# Patient Record
Sex: Female | Born: 1940
Health system: Southern US, Community
[De-identification: ages and names within clinical notes are randomized; demographics above are authoritative.]

## PROBLEM LIST (undated history)

## (undated) DIAGNOSIS — R259 Unspecified abnormal involuntary movements: Secondary | ICD-10-CM

## (undated) DIAGNOSIS — F329 Major depressive disorder, single episode, unspecified: Secondary | ICD-10-CM

## (undated) DIAGNOSIS — M25569 Pain in unspecified knee: Secondary | ICD-10-CM

## (undated) DIAGNOSIS — R29898 Other symptoms and signs involving the musculoskeletal system: Secondary | ICD-10-CM

## (undated) DIAGNOSIS — R030 Elevated blood-pressure reading, without diagnosis of hypertension: Secondary | ICD-10-CM

## (undated) DIAGNOSIS — M899 Disorder of bone, unspecified: Secondary | ICD-10-CM

## (undated) DIAGNOSIS — M949 Disorder of cartilage, unspecified: Secondary | ICD-10-CM

## (undated) DIAGNOSIS — M1990 Unspecified osteoarthritis, unspecified site: Secondary | ICD-10-CM

## (undated) DIAGNOSIS — E7800 Pure hypercholesterolemia, unspecified: Secondary | ICD-10-CM

## (undated) HISTORY — DX: Other symptoms and signs involving the musculoskeletal system: R29.898

## (undated) HISTORY — DX: Elevated blood-pressure reading, without diagnosis of hypertension: R03.0

## (undated) HISTORY — DX: Pain in unspecified knee: M25.569

## (undated) HISTORY — DX: Pure hypercholesterolemia, unspecified: E78.00

## (undated) HISTORY — DX: Disorder of cartilage, unspecified: M94.9

## (undated) HISTORY — DX: Unspecified abnormal involuntary movements: R25.9

## (undated) HISTORY — DX: Disorder of bone, unspecified: M89.9

## (undated) HISTORY — DX: Unspecified osteoarthritis, unspecified site: M19.90

## (undated) HISTORY — DX: Major depressive disorder, single episode, unspecified: F32.9

## (undated) HISTORY — PX: COLONOSCOPY: SHX174

---

## 1973-03-30 HISTORY — PX: VAGINAL HYSTERECTOMY: SUR661

## 1978-11-29 HISTORY — PX: BREAST SURGERY: SHX581

## 2012-12-09 ENCOUNTER — Ambulatory Visit (INDEPENDENT_AMBULATORY_CARE_PROVIDER_SITE_OTHER): Payer: Medicare Other | Admitting: Cardiovascular Disease

## 2012-12-09 ENCOUNTER — Encounter: Payer: Self-pay | Admitting: Cardiovascular Disease

## 2012-12-09 VITALS — BP 136/67 | HR 71 | Ht 67.0 in | Wt 171.0 lb

## 2012-12-09 DIAGNOSIS — R079 Chest pain, unspecified: Secondary | ICD-10-CM

## 2012-12-09 DIAGNOSIS — R0789 Other chest pain: Secondary | ICD-10-CM | POA: Insufficient documentation

## 2012-12-09 NOTE — Patient Instructions (Addendum)
Your physician has requested that you have a lexiscan myoview.  Please follow instruction sheet, as given.  Your physician recommends that you schedule a follow-up appointment in: as needed basis

## 2012-12-09 NOTE — Progress Notes (Signed)
     Mariah Milling Date of Birth  [birth year]       [facility] Office [address], [address]  [address], [address] [place], [place]  [address]   [place], [place]  [address] [phone]     [phone]   Fax  [phone]    Fax [phone]  Problem List: 1. Chest pain 2.   History of Present Illness:  Ms. Schmutz is a 72 yo who presents today with a 3-4 months hx of intermittant CP/ discomfort.  Only lasts a few seconds.  Occurs at any time - not associated with  , exertion, eating, drinking, deep breath.  The discomfort runs up to her throuat and into her jaw.   She thought it may be gas.   She has occasional palps that sound like PVCs.  No tachycardia.   She does some activity - [employer] - 3 times a week.    She is able to keep up with the other ladies without problems.  She does have some leg fatigue ( gives out) when she does the treadmill.  Her son has noticed that she has a shuffling gait.  She has some back pain.   She occasionally has a "hot feeling" that comes over her and she gets very weak.  Last several minutes.  This episode is not associated with any chest pain.  Occurs any time.  She has not felt well for the past several month- her medical doctor has checked thyroid, basic labs.    She had a normal stress test in July  No current outpatient prescriptions on file prior to visit.   No current facility-administered medications on file prior to visit.    No Known Allergies  No past medical history on file.  Past Surgical History  Procedure Laterality Date  . Abdominal hysterectomy    . Breast surgery      benign cyst    History  Smoking status  . Never Smoker   Smokeless tobacco  . Not on file    History  Alcohol Use No    Family History  Problem Relation Age of Onset  . CAD Mother   . CAD Father   . CAD Brother     Reviw of Systems:  Reviewed in the HPI.  All other systems are negative.  Physical  Exam: Blood pressure 136/67, pulse 71, height 5\' 7"  (1.702 m). General: Well developed, well nourished, in no acute distress.  Head: Normocephalic, atraumatic, sclera non-icteric, mucus membranes are moist,   Neck: Supple. Carotids are 2 + without bruits. No JVD   Lungs: Clear   Heart: RR, normal S1, split S2   Abdomen: Soft, non-tender, non-distended with normal bowel sounds.  Msk:  Strength and tone are normal   Extremities: No clubbing or cyanosis. No edema.  Distal pedal pulses are 2+ and equal    Neuro: CN II - XII intact.  Alert and oriented X 3.   Psych:  Normal   ECG: Sept. 12, 2014:  NSR at 71. Normal ECG.    Assessment / Plan:

## 2012-12-09 NOTE — Assessment & Plan Note (Addendum)
Tami Vega presents today for further evaluation of some rather atypical episodes of chest pain. She does have a strong family history of coronary artery disease including CAD in her mother, father, and brother. She has episodes of sudden weakness and fatigue. She's had a stress echocardiogram but she did not walk for very long and I actually think it  was a nondiagnostic test.  I would like to perform a lexiscan Myoview study for further evaluation. We will not schedule her for a return appointment but I'll be glad to see her if the Myoview study is abnormal or she has any further problems. We will have her return to see her medical doctor.

## 2012-12-29 ENCOUNTER — Encounter (HOSPITAL_COMMUNITY): Payer: Medicare Other

## 2013-01-27 ENCOUNTER — Institutional Professional Consult (permissible substitution): Payer: Self-pay | Admitting: Cardiovascular Disease

## 2013-02-07 ENCOUNTER — Telehealth: Payer: Self-pay | Admitting: *Deleted

## 2013-02-07 NOTE — Telephone Encounter (Signed)
Pt declined stress test. Was made aware test order was still available if she want to proceed. She verbalized understanding.

## 2013-02-07 NOTE — Telephone Encounter (Signed)
Message copied by Antony Odea on Tue Feb 07, 2013  5:51 PM ------      Message from: Mariane Masters D      Created: Fri Dec 09, 2012 10:09 AM      Regarding: MYOVIEW       12/09/12 Appointment on 12/29/12. ------

## 2013-10-13 ENCOUNTER — Ambulatory Visit: Payer: No Typology Code available for payment source | Admitting: Neurology

## 2013-10-19 ENCOUNTER — Encounter: Payer: Self-pay | Admitting: *Deleted

## 2013-10-20 ENCOUNTER — Telehealth: Payer: Self-pay | Admitting: Neurology

## 2013-10-20 ENCOUNTER — Ambulatory Visit (INDEPENDENT_AMBULATORY_CARE_PROVIDER_SITE_OTHER): Payer: Medicare HMO | Admitting: Neurology

## 2013-10-20 ENCOUNTER — Encounter: Payer: Self-pay | Admitting: Neurology

## 2013-10-20 VITALS — BP 164/86 | HR 68 | Temp 98.0°F | Ht 67.0 in | Wt 176.0 lb

## 2013-10-20 DIAGNOSIS — F418 Other specified anxiety disorders: Secondary | ICD-10-CM

## 2013-10-20 DIAGNOSIS — G20 Parkinson's disease: Secondary | ICD-10-CM

## 2013-10-20 DIAGNOSIS — F341 Dysthymic disorder: Secondary | ICD-10-CM

## 2013-10-20 MED ORDER — RASAGILINE MESYLATE 1 MG PO TABS: ORAL_TABLET | ORAL | Status: DC

## 2013-10-20 NOTE — Patient Instructions (Addendum)
You have signs and symptoms of Parkinson's disease.   Remember to drink plenty of fluid, eat healthy meals and do not skip any meals. Try to eat protein with a every meal and eat a healthy snack such as fruit or nuts in between meals. Try to keep a regular sleep-wake schedule and try to exercise daily, particularly in the form of walking, 20-30 minutes a day, if you can.   Taking your medication on schedule is key. I would like to start you on Azilect (generic name: rasagiline) 1 mg strength: take 1/2 pill each morning for 2 weeks, then 1 whole pill each morning thereafter and ongoing until further notice, or unless you develop an adverse reaction or side effects. Common side effects reported are headaches, nausea, diarrhea, or constipation. Rare side effects can be confusion, personality changes, hallucinations.  Try to stay active physically and mentally. Engage in social activities in your community and with your family and try to keep up with current events by reading the newspaper or watching the news. Try to do word puzzles and you may like to do word puzzles and brain games on the computer such as on [email or web address].   As far as diagnostic testing, I will order: Brain MRI.   I would like to see you back in 3 months, sooner if we need to. Please call us with any interim questions, concerns, problems, updates or refill requests.  Please also call us for any test results so we can go over those with you on the phone. Our nursing staff will answer any of your questions and relay your messages to me and also relay most of my messages to you.  Our phone number is [phone]. We also have an after hours call service for urgent matters and there is a physician on-call for urgent questions, that cannot wait till the next work day. For any emergencies you know to call 911 or go to the nearest emergency room.

## 2013-10-20 NOTE — Telephone Encounter (Signed)
I called back to provide number for the assistance program with this drug, [phone].  She will contact them and see if she qualifies for aide.  If she does not meet the criteria, she will call us back, otherwise she will proceed with enrolling in the program.

## 2013-10-20 NOTE — Telephone Encounter (Signed)
Please advise 

## 2013-10-20 NOTE — Telephone Encounter (Signed)
Patoent states that AZILECT is too expensive for her and is questioning if there is another medication less expensive that she can be prescribed. Please call to advise.

## 2013-10-20 NOTE — Progress Notes (Signed)
Subjective:    Patient ID: Tami Vega is a 73 y.o. female.  HPI    Tami Foley, MD, PhD [facility] [address], [address] [address] [place], [place] [address]  Dear Dr. Belva Crome,   I saw your patient, Tami Vega, upon your kind request in my neurologic clinic today for initial consultation of her tremor. The patient is accompanied by her oldest son, Tami Vega, today. As you know, Tami Vega is a 73 year old right-handed woman with an underlying medical history of hyperlipidemia, history of pituitary microadenoma, precancerous breast lesions status post double mastectomy in the 1980s, who has noticed a left upper extremity tremor some 6 months to 12 months ago.  She has noted L sided issues with fine motor skills, resting tremor on the LUE, she feels easily fatigued and it is difficult for her to pick up her L leg sometimes. Thankfully, she has not fallen, but her balance is off at times. She goes to [employer] 4 times a week, but has less stamina. She is a life long non-smoker, does not drink alcohol and has no history of head injury, exposure to pesticides, but did work in a [employer], therapeutic for 33 years and [place]. She has no FHx of PD or tremors.   Her Past Medical History Is Significant For: Past Medical History  Diagnosis Date  . Other musculoskeletal symptoms referable to limbs(729.89)   . Pain in joint, lower leg   . Disorder of bone and cartilage, unspecified   . Pure hypercholesterolemia   . Abnormal involuntary movements(781.0)   . Major depressive disorder, single episode, unspecified   . Elevated blood pressure reading without diagnosis of hypertension     Her Past Surgical History Is Significant For: Past Surgical History  Procedure Laterality Date  . Vaginal hysterectomy  1975  . Breast surgery  1980's    benign cyst  . Colonoscopy  1995, 1998    Her Family History Is Significant For: Family History  Problem Relation Age of  Onset  . CAD Mother   . CAD Father   . CAD Brother   . Breast cancer Mother   . Breast cancer Maternal Aunt   . Colon cancer Maternal Aunt   . Hyperlipidemia Sister   . Hypertension Sister     Her Social History Is Significant For: History   Social History  . Marital Status: Married    Spouse Name: N/A    Number of Children: N/A  . Years of Education: N/A   Social History Main Topics  . Smoking status: Never Smoker   . Smokeless tobacco: None  . Alcohol Use: No  . Drug Use: No  . Sexual Activity: None   Other Topics Concern  . None   Social History Narrative  . None    Her Allergies Are:  No Known Allergies:   Her Current Medications Are:  Outpatient Encounter Prescriptions as of 10/20/2013  Medication Sig  . Calcium Carbonate-Vit D-Min (CALCIUM 1200 PO) Take by mouth daily. Take one tablet a day  . Cholecalciferol (VITAMIN D-3 PO) Take by mouth daily. Take one capsule a day  . cyanocobalamin 1000 MCG tablet Take 100 mcg by mouth daily.  Marland Kitchen escitalopram (LEXAPRO) 5 MG tablet Take 1 tablet by mouth daily.  . fish oil-omega-3 fatty acids 1000 MG capsule Take 2 g by mouth daily.  . Multiple Vitamin (MULTIVITAMIN) capsule Take 1 capsule by mouth daily.  :   Review of Systems:  Out of a complete 14 point review of systems, all are reviewed and negative with the exception of these symptoms as listed below:  Review of Systems  Constitutional: Positive for fatigue.  HENT: Positive for hearing loss.   Eyes: Negative.   Respiratory: Negative.   Cardiovascular: Negative.   Gastrointestinal: Positive for constipation.  Endocrine: Negative.   Genitourinary: Negative.   Musculoskeletal: Negative.   Skin: Negative.   Allergic/Immunologic: Negative.   Neurological: Positive for weakness.  Hematological: Negative.   Psychiatric/Behavioral: The patient is nervous/anxious.     Objective:  Neurologic Exam  Physical Exam Physical Examination:   Filed Vitals:    10/20/13 0931  BP: 164/86  Pulse: 68  Temp: 98 F (36.7 C)   Physical Examination:   Filed Vitals:   10/20/13 0931  BP: 164/86  Pulse: 68  Temp: 98 F (36.7 C)    General Examination: The patient is a very pleasant 73 y.o. female in no acute distress.  HEENT: Normocephalic, atraumatic, pupils are equal, round and reactive to light and accommodation. Funduscopic exam is normal with sharp disc margins noted. Extraocular tracking shows mild saccadic breakdown without nystagmus noted. There is limitation to upper gaze. There is mild decrease in eye blink rate. Hearing is very mildly impaired. Tympanic membranes are clear bilaterally. Face is symmetric with mild facial masking and normal facial sensation. There is no lip, neck or jaw tremor. Neck is mildly rigid with intact passive ROM. There are no carotid bruits on auscultation. Oropharynx exam reveals moderate mouth dryness. No significant airway crowding is noted. Mallampati is class II. Tongue protrudes centrally and palate elevates symmetrically.   There is no drooling.   Chest: is clear to auscultation without wheezing, rhonchi or crackles noted.  Heart: sounds are regular and normal without murmurs, rubs or gallops noted.   Abdomen: is soft, non-tender and non-distended with normal bowel sounds appreciated on auscultation.  Extremities: There is no pitting edema in the distal lower extremities bilaterally. Pedal pulses are intact.   Skin: is warm and dry with no trophic changes noted. Age-related changes are noted on the skin.   Musculoskeletal: exam reveals no obvious joint deformities, tenderness, joint swelling or erythema.  Neurologically:  Mental status: The patient is awake and alert, paying good  attention. She is able to completely provide the history. Her son provides details. She is oriented to: person, place, time/date, situation, day of week, month of year and year. Her memory, attention, language and knowledge are  impaired. There is no aphasia, agnosia, apraxia or anomia. There is a very mild degree of bradyphrenia. Speech is mildly hypophonic with no dysarthria noted. Mood is congruent and affect is normal. Cranial nerves are as described above under HEENT exam. In addition, shoulder shrug is normal with equal shoulder height noted.  Motor exam: Normal bulk, and strength for age is noted. There are no dyskinesias noted.  Tone is mildly rigid with presence of cogwheeling in the left upper extremity. There is overall mild bradykinesia. There is no drift or rebound.  There is a mild resting tremor in the left upper extremity and no other resting tremor. The tremor is constant.  Romberg is negative.  Reflexes are 2+ in the upper extremities and 2+ in the lower extremities. Toes are downgoing bilaterally.  Fine motor skills exam: Finger taps are not impaired on the right and mildly impaired on the left. Hand movements are not impaired on the right and mildly impaired on the left. RAP (rapid alternating  patting) is minimally impaired on the right and mildly impaired on the left. Foot taps are minimally impaired on the right and mildly impaired on the left. Foot agility (in the form of heel stomping) is not  impaired on the right and mildly impaired on the left.    Cerebellar testing shows no dysmetria or intention tremor on finger to nose testing. Heel to shin is unremarkable bilaterally. There is no truncal or gait ataxia.   Sensory exam is intact to light touch, pinprick, vibration, temperature sense in the upper and lower extremities.   Gait, station and balance: She stands up from the seated position with mild difficulty and does need to push up with Her hands. She needs no assistance. No veering to one side is noted. She is oted to lean to the left very slightly. Posture is mildly stooped, more for age. Stance is narrow-based. She walks with decrease in stride length and pace and decreased arm swing on the left  and also on the R. She turns in 3 steps. Tandem walk is difficult for her but balance is overall preserved.    Assessment and Plan:   In summary, Crislyn Willbanks is a very pleasant 73 y.o.-year old female with a history and physical exam consistent with left-sided parkinsonism, most likely left-sided predominant Parkinson's disease, tremor predominant. Her findings are mild. I explained this to her and her son. A long discussion with the patient and her son today about the diagnosis of PDD, its prognosis and treatment options. I explained to them that there is no curative treatment. I would like to proceed with an MRI brain.   We talked about medical treatments and non-pharmacological approaches. We talked about maintaining a healthy lifestyle in general. I encouraged the patient to eat healthy, exercise daily and keep well hydrated, to keep a scheduled bedtime and wake time routine, to not skip any meals and eat healthy snacks in between meals and to have protein with every meal. In particular, I stressed the importance of regular exercise, within of course the patient's own mobility limitations.   As far as further diagnostic testing is concerned, I suggested: MRI brain wo contrast.  As far as medications are concerned, I recommended the following at this time: I talked to her about different medication options. I would like to start with Azilect 1 mg strength half a pill once daily around 10 AM. I am aware that she is taking Lexapro in low-dose. We talked about hypothetical interaction with this medication and Azilect and I reassured him that I have had many patients that take an SSRI and Azilect without any complications or issues and I did tell her to not take them altogether and as she takes her Lexapro early in the morning I have asked her to take the Azilect late morning. I explained potential side effects to them and broken down as well. We will call her with her MRI results as they become available.  I would like to see her back in about 3 months, sooner if the need arises.  I answered all their questions today and the patient and her son were in agreement with the above outlined plan. They are encouraged to call with any interim questions, concerns, problems or updates and refill requests and/or test results.   Thank you very much for allowing me to participate in the care of this nice patient. If I can be of any further assistance to you please do not hesitate to call me at  [phone].  Sincerely,   Star Age, MD, PhD

## 2013-11-03 ENCOUNTER — Other Ambulatory Visit: Payer: Medicare Other

## 2013-11-06 ENCOUNTER — Inpatient Hospital Stay: Admission: RE | Admit: 2013-11-06 | Payer: Medicare Other | Source: Ambulatory Visit

## 2013-11-10 ENCOUNTER — Inpatient Hospital Stay: Admission: RE | Admit: 2013-11-10 | Payer: Medicare Other | Source: Ambulatory Visit

## 2014-02-21 ENCOUNTER — Telehealth: Payer: Self-pay | Admitting: Neurology

## 2014-02-21 NOTE — Telephone Encounter (Signed)
Patient has confirmed appointment time change for 04/17/14

## 2014-04-06 ENCOUNTER — Other Ambulatory Visit: Payer: Self-pay | Admitting: Neurology

## 2014-04-17 ENCOUNTER — Encounter: Payer: Self-pay | Admitting: Neurology

## 2014-04-17 ENCOUNTER — Ambulatory Visit (INDEPENDENT_AMBULATORY_CARE_PROVIDER_SITE_OTHER): Payer: Medicare HMO | Admitting: Neurology

## 2014-04-17 VITALS — BP 118/72 | HR 71 | Temp 97.1°F | Ht 67.0 in | Wt 184.0 lb

## 2014-04-17 DIAGNOSIS — G20 Parkinson's disease: Secondary | ICD-10-CM

## 2014-04-17 MED ORDER — RASAGILINE MESYLATE 1 MG PO TABS: 1.0000 mg | ORAL_TABLET | Freq: Every day | ORAL | Status: DC

## 2014-04-17 NOTE — Progress Notes (Signed)
Subjective:    Patient ID: Tami Vega is a 74 y.o. female.  HPI     Interim history:   Tami Vega is a very pleasant 74 year old right-handed woman with an underlying medical history of hyperlipidemia, history of pituitary microadenoma, precancerous breast lesions status post double mastectomy in the 1980s, who presents for follow-up consultation of her parkinsonism, most likely left-sided predominant Parkinson's disease. The patient is accompanied by her husband today. I first met her on 10/20/2013 at the request of her primary care physician, at which time she gave a 6-12 month history of left upper extremity tremor. She also reported problems with fine motor skills on the left. Her history and physical exam are in keeping with mild parkinsonism, probably left eye predominant Parkinson's disease. I suggested further workup with a brain MRI and starting her on Azilect.  She did not have a brain scan done.   Today, she reports doing a little better with her tremor, but it flares up when she is nervous. She can still sew with her sewing machine. She has had no side effects. Her insurance denied the brain scan. She found out that she has 2 female cousins on her dad's side with Parkinson's disease. She has not fallen recently. She has no new symptoms.  She has noted L sided issues with fine motor skills, resting tremor on the LUE, she feels easily fatigued and it is difficult for her to pick up her L leg sometimes. Thankfully, she has not fallen, but her balance is off at times. She goes to [employer] 4 times a week, but has less stamina. She is a life long non-smoker, does not drink alcohol and has no history of head injury, exposure to pesticides, but did work in a Emergency planning/management officer for 33 years and [place]. She has no FHx of PD or tremors.   Her Past Medical History Is Significant For: Past Medical History  Diagnosis Date  . Other musculoskeletal symptoms referable to limbs(729.89)   .  Pain in joint, lower leg   . Disorder of bone and cartilage, unspecified   . Pure hypercholesterolemia   . Abnormal involuntary movements(781.0)   . Major depressive disorder, single episode, unspecified   . Elevated blood pressure reading without diagnosis of hypertension     Her Past Surgical History Is Significant For: Past Surgical History  Procedure Laterality Date  . Vaginal hysterectomy  1975  . Breast surgery  1980's    benign cyst  . Colonoscopy  1995, 1998    Her Family History Is Significant For: Family History  Problem Relation Age of Onset  . CAD Mother   . CAD Father   . CAD Brother   . Breast cancer Mother   . Breast cancer Maternal Aunt   . Colon cancer Maternal Aunt   . Hyperlipidemia Sister   . Hypertension Sister     Her Social History Is Significant For: History   Social History  . Marital Status: Married    Spouse Name: N/A    Number of Children: N/A  . Years of Education: N/A   Social History Main Topics  . Smoking status: Never Smoker   . Smokeless tobacco: Never Used  . Alcohol Use: No  . Drug Use: No  . Sexual Activity: None   Other Topics Concern  . None   Social History Narrative    Her Allergies Are:  No Known Allergies:   Her Current Medications Are:  Outpatient Encounter  Prescriptions as of 04/17/2014  Medication Sig  . AZILECT 1 MG TABS tablet TAKE 1/2 A TABLET AT 10AM FOR 2 WEEKS THEN 1 TABLET DAILY THEREAFTER  . Calcium Carbonate-Vit D-Min (CALCIUM 1200 PO) Take by mouth daily. Take one tablet a day  . Cholecalciferol (VITAMIN D-3 PO) Take by mouth daily. Take one capsule a day  . cyanocobalamin 1000 MCG tablet Take 100 mcg by mouth daily.  Marland Kitchen escitalopram (LEXAPRO) 5 MG tablet Take 1 tablet by mouth daily.  . fish oil-omega-3 fatty acids 1000 MG capsule Take 2 g by mouth daily.  . Multiple Vitamin (MULTIVITAMIN) capsule Take 1 capsule by mouth daily.  :  Review of Systems:  Out of a complete 14 point review of  systems, all are reviewed and negative with the exception of these symptoms as listed below:   Review of Systems  Musculoskeletal:       Joint pain both shoulders  Neurological: Positive for tremors and weakness.       Left arm, stomach swelling since on medication(Azilect)    Objective:  Neurologic Exam  Physical Exam Physical Examination:   Filed Vitals:   04/17/14 1400  BP: 118/72  Pulse: 71  Temp: 97.1 F (36.2 C)   General Examination: The patient is a very pleasant 74 y.o. female in no acute distress.  HEENT: Normocephalic, atraumatic, pupils are equal, round and reactive to light and accommodation. Funduscopic exam is normal with sharp disc margins noted. Extraocular tracking shows mild saccadic breakdown without nystagmus noted. There is limitation to upper gaze. There is mild decrease in eye blink rate. Hearing is very mildly impaired. Face is symmetric with mild facial masking and normal facial sensation. There is no lip, neck or jaw tremor. Neck is mildly rigid with intact passive ROM. There are no carotid bruits on auscultation. Oropharynx exam reveals moderate mouth dryness. No significant airway crowding is noted. Mallampati is class II. Tongue protrudes centrally and palate elevates symmetrically. There is no drooling.   Chest: is clear to auscultation without wheezing, rhonchi or crackles noted.  Heart: sounds are regular and normal without murmurs, rubs or gallops noted.   Abdomen: is soft, non-tender and non-distended with normal bowel sounds appreciated on auscultation.  Extremities: There is no pitting edema in the distal lower extremities bilaterally. Pedal pulses are intact.   Skin: is warm and dry with no trophic changes noted. Age-related changes are noted on the skin.   Musculoskeletal: exam reveals no obvious joint deformities, tenderness, joint swelling or erythema.  Neurologically:  Mental status: The patient is awake and alert, paying good   attention. She is able to completely provide the history. Her son provides details. She is oriented to: person, place, time/date, situation, day of week, month of year and year. Her memory, attention, language and knowledge are impaired. There is no aphasia, agnosia, apraxia or anomia. There is a very mild degree of bradyphrenia. Speech is mildly hypophonic with no dysarthria noted. Mood is congruent and affect is normal. Cranial nerves are as described above under HEENT exam. In addition, shoulder shrug is normal with equal shoulder height noted.  Motor exam: Normal bulk, and strength for age is noted. There are no dyskinesias noted.  Tone is mildly rigid with presence of cogwheeling in the left upper extremity. There is overall mild bradykinesia. There is no drift or rebound.  There is a mild resting tremor in the left upper extremity and no other resting tremor. The tremor is constant.  Romberg is  negative.  Reflexes are 2+ in the upper extremities and 2+ in the lower extremities. Toes are downgoing bilaterally.  Fine motor skills exam: Finger taps are not impaired on the right and mildly impaired on the left. Hand movements are not impaired on the right and mildly impaired on the left. RAP (rapid alternating patting) is minimally impaired on the right and mildly impaired on the left. Foot taps are minimally impaired on the right and mildly impaired on the left. Foot agility (in the form of heel stomping) is not impaired on the right and mildly impaired on the left.    Cerebellar testing shows no dysmetria or intention tremor on finger to nose testing. Heel to shin is unremarkable bilaterally. There is no truncal or gait ataxia.   Sensory exam is intact to light touch, pinprick, vibration, temperature sense in the upper and lower extremities.   Gait, station and balance: She stands up from the seated position with mild difficulty and does need to push up with Her hands. She needs no assistance. No  veering to one side is noted. She is not noted to lean today. Posture is mildly stooped for age. Stance is narrow-based. She walks with decrease in stride length and fairly good pace and decreased arm swing on the left and to a lesser degree on the R. She turns in 3 steps. Tandem walk is difficult for her but balance is overall preserved.   Assessment and Plan:   In summary, Aurielle Slingerland is a very pleasant 74 year old female with a history and physical exam consistent with left-sided parkinsonism, most likely left-sided predominant Parkinson's disease, tremor predominant. Her findings are mild. She has done well with Azilect and that she tolerates it well and she feels that her tremor has toned down some. Unfortunately, her insurance denied her brain MRI. We will look into this again. We again talked about leading a healthy lifestyle in general. She is encouraged to drink more water. She is advised to exercise regularly, particularly in the form of walking. She likes to go to the gym and feels energized after going to the gym. She is advised to continue with Azilect 1 mg once daily and I provided her with a 90 day prescription. She requested a change in her pharmacy. I would like to see her back in 6 months, sooner if need be.  I answered all their questions today and the patient and her husband were in agreement with the above outlined plan. They are encouraged to call with any interim questions, concerns, problems or updates and refill requests

## 2014-04-17 NOTE — Patient Instructions (Signed)
I think your Parkinson's disease has remained fairly stable, which is reassuring. Nevertheless, as you know, this disease does progress with time. It can affect your balance, your memory, your mood, your bowel and bladder function, your posture, balance and walking. Overall you are doing fairly well but I do want to suggest a few things today:  Remember to drink plenty of fluid, eat healthy meals and do not skip any meals. Try to eat protein with a every meal and eat a healthy snack such as fruit or nuts in between meals. Try to keep a regular sleep-wake schedule and try to exercise daily, particularly in the form of walking, 20-30 minutes a day, if you can.   Taking your medication on schedule is key.   Try to stay active physically and mentally. Engage in social activities in your community and with your family and try to keep up with current events by reading the newspaper or watching the news. Try to do word puzzles and you may like to do word puzzles and brain games on the computer such as on [email or web address].   As far as your medications are concerned, I would like to suggest that you take your current medication with the following additional changes: no change, continue Azilect 1 mg daily.  We will look into the brain MRI issue again.  I would like to see you back in 6 months, sooner if we need to. Please call us with any interim questions, concerns, problems, updates or refill requests.  Our phone number is [phone]. We also have an after hours call service for urgent matters and there is a physician on-call for urgent questions, that cannot wait till the next work day. For any emergencies you know to call 911 or go to the nearest emergency room.

## 2014-05-02 ENCOUNTER — Telehealth: Payer: Self-pay

## 2014-05-02 NOTE — Telephone Encounter (Signed)
Patient states pharmacy ([facility]) has not received Azilect Rx. Advised patient rx was e scribed and confirmation receipt was received on 04/17/14. Advised to check with pharmacy again and i  would send message to pharm tech. Patient agreed.

## 2014-05-02 NOTE — Telephone Encounter (Signed)
I called the pharmacy.  Spoke with Rosanne AshingJim.  Verified Rx verbally.  They will process order and contact patient when meds are ready for pick up.  I called the patient back.  She is aware.

## 2014-06-02 ENCOUNTER — Other Ambulatory Visit: Payer: Self-pay | Admitting: Neurology

## 2014-06-06 NOTE — Telephone Encounter (Signed)
The clinic was supposed to be doing refills, however this one did not get completed by them.  

## 2014-06-07 ENCOUNTER — Telehealth: Payer: Self-pay | Admitting: Neurology

## 2014-06-07 NOTE — Telephone Encounter (Signed)
She got a call from [facility] saying that there was a 3 month order of Azilect and doesn't understand why [facility] would have a order for her because she said that Dr. Frances FurbishAthar said she going to take [facility] off of her order, she was going to put [facility] instead. The patient said [facility] so there was no need for an order to be sent into [facility].  Her best number to call her is  [phone]

## 2014-06-07 NOTE — Telephone Encounter (Signed)
Talked to Tami Vega. I advised her that I took [facility] out of her pharmacy list. She has enough refills from [facility], which she prefers.

## 2014-10-16 ENCOUNTER — Ambulatory Visit (INDEPENDENT_AMBULATORY_CARE_PROVIDER_SITE_OTHER): Payer: Medicare HMO | Admitting: Neurology

## 2014-10-16 ENCOUNTER — Encounter: Payer: Self-pay | Admitting: Neurology

## 2014-10-16 VITALS — BP 125/65 | HR 68 | Resp 14 | Ht 67.0 in | Wt 186.0 lb

## 2014-10-16 DIAGNOSIS — G20 Parkinson's disease: Secondary | ICD-10-CM | POA: Diagnosis not present

## 2014-10-16 DIAGNOSIS — F418 Other specified anxiety disorders: Secondary | ICD-10-CM | POA: Diagnosis not present

## 2014-10-16 MED ORDER — RASAGILINE MESYLATE 1 MG PO TABS: 1.0000 mg | ORAL_TABLET | Freq: Every day | ORAL | Status: DC

## 2014-10-16 MED ORDER — CARBIDOPA-LEVODOPA 25-100 MG PO TABS: ORAL_TABLET | ORAL | Status: DC

## 2014-10-16 NOTE — Patient Instructions (Signed)
We will continue with Azilect once daily.   We will start Sinemet (generic name: carbidopa-levodopa) 25/100 mg: Take half a pill twice daily (8 AM and noon) for one week, then half a pill 3 times a day (8 AM, noon, and 4 PM) for one week, then one pill 3 times a day thereafter. Please try to take the medication away from you mealtimes, that is, ideally either one hour before or 2 hours after your meal to ensure optimal absorption. The medication can interfere with the protein content of your meal and trying to the protein in your food and therefore not get fully absorbed.  Common side effects reported are: Nausea, vomiting, sedation, confusion, lightheadedness. Rare side effects include hallucinations, severe nausea or vomiting, diarrhea and significant drop in blood pressure especially when going from lying to standing or from sitting to standing.

## 2014-10-16 NOTE — Progress Notes (Signed)
Subjective:    Vega ID: Tami Vega is a 74 y.o. female.  HPI     Interim history:   Tami Vega is a very pleasant 74 year old right-handed woman with an underlying medical history of hyperlipidemia, history of pituitary microadenoma, precancerous breast lesions status post double mastectomy in Tami 1980s, who presents for follow-up consultation of Tami Vega parkinsonism, most likely left-sided predominant Parkinson's disease. Tami Vega is accompanied by Tami Vega husband Tami Vega. Tami Vega on 04/17/2014, at which time Tami Vega reported doing a little better with regards to Tami Vega tremor. Tami Vega noticed a flareup of Tami Vega tremor with nervousness. Tami Vega was still able to use Tami Vega sewing machine. Tami Vega had no side effects from Tami Vega medications. Tami Vega did find out that Tami Vega had 2 female cousins on Tami Vega father's side with Parkinson's disease. Tami asked Tami Vega to continue Azilect 1 mg once daily.   Tami Vega, 10/16/2014: Tami Vega reports that Tami Vega tremor is a little worse. Tami Vega also feels that Tami Vega walking is worse. Tami Vega has a harder time picking up Tami Vega feet, particularly on Tami left side. Thankfully Tami Vega has not fallen. Tami Vega tries to drink water primarily but may not get 6 glasses a day. Tami Vega continues to stay active and exercises at Tami gym as well as at home on Tami Vega treadmill. Tami Vega has been sleeping a little better since Tami Vega switched Tami Vega and Tami Vega to present to night time.  Previously:   Tami first met Tami Vega on 10/20/2013 at Tami request of Tami Vega primary care physician, at which time Tami Vega gave a 6-12 month history of left upper extremity tremor. Tami Vega also reported problems with fine motor skills on Tami left. Tami Vega history and physical exam are in keeping with mild parkinsonism, probably left eye predominant Parkinson's disease. Tami suggested further workup with a brain MRI and starting Tami Vega on Azilect.   Tami Vega did not have a brain scan done.   Tami Vega has noted L sided issues with fine motor skills, resting tremor on Tami LUE, Tami Vega feels easily fatigued and it is  difficult for Tami Vega to pick up Tami Vega L leg sometimes. Thankfully, Tami Vega has not fallen, but Tami Vega balance is off at times. Tami Vega goes to [employer] 4 times a week, but has less stamina. Tami Vega is a life long non-smoker, does not drink alcohol and has no history of head injury, exposure to pesticides, but did work in a Emergency planning/management officer for 33 years and T[place] V[place]. Tami Vega has no FHx of PD or tremors.    Tami Vega Past Medical History Is Significant For: Past Medical History  Diagnosis Date  . Other musculoskeletal symptoms referable to limbs(729.89)   . Pain in joint, lower leg   . Disorder of bone and cartilage, unspecified   . Pure hypercholesterolemia   . Abnormal involuntary movements(781.0)   . Major depressive disorder, single episode, unspecified   . Elevated blood pressure reading without diagnosis of hypertension     Tami Vega Past Surgical History Is Significant For: Past Surgical History  Procedure Laterality Date  . Vaginal hysterectomy  1975  . Breast surgery  1980's    benign cyst  . Colonoscopy  1995, 1998    Tami Vega Family History Is Significant For: Family History  Problem Relation Age of Onset  . CAD Mother   . CAD Father   . CAD Brother   . Breast cancer Mother   . Breast cancer Maternal Aunt   . Colon cancer Maternal Aunt   . Hyperlipidemia Sister   . Hypertension Sister  Tami Vega Social History Is Significant For: History   Social History  . Marital Status: Married    Spouse Name: N/A  . Number of Children: N/A  . Years of Education: N/A   Social History Main Topics  . Smoking status: Never Smoker   . Smokeless tobacco: Never Used  . Alcohol Use: No  . Drug Use: No  . Sexual Activity: Not on file   Other Topics Concern  . None   Social History Narrative    Tami Vega Allergies Are:  No Known Allergies:   Tami Vega Current Medications Are:  Outpatient Encounter Prescriptions as of 10/16/2014  Medication Sig  . Calcium Carbonate-Vit D-Min (CALCIUM 1200 PO) Take by mouth daily.  Take one tablet a day  . Cholecalciferol (VITAMIN D-3 PO) Take by mouth daily. Take one capsule a day  . cyanocobalamin 1000 MCG tablet Take 100 mcg by mouth daily.  Mariane Baumgarten Calcium (STOOL SOFTENER PO) Take by mouth.  . escitalopram (LEXAPRO) 5 MG tablet Take 1 tablet by mouth daily.  . fish oil-omega-3 fatty acids 1000 MG capsule Take 2 g by mouth daily.  . Multiple Vitamin (MULTIVITAMIN) capsule Take 1 capsule by mouth daily.  Marland Kitchen omeprazole (PRILOSEC) 20 MG capsule   . Plant Sterols and Stanols (CHOLEST OFF PO) Take by mouth.  . psyllium (METAMUCIL) 58.6 % powder Take 1 packet by mouth 3 (three) times daily.  . rasagiline (AZILECT) 1 MG TABS tablet Take 1 tablet (1 mg total) by mouth daily.  . [DISCONTINUED] rasagiline (AZILECT) 1 MG TABS tablet Take 1 tablet (1 mg total) by mouth daily.   No facility-administered encounter medications on file as of 10/16/2014.  :  Review of Systems:  Out of a complete 14 point review of systems, all are reviewed and negative with Tami exception of these symptoms as listed below:   Review of Systems  Neurological:       Increased L arm tremors, increased gait trouble, "pains" in head at different times and places     Objective:  Neurologic Exam  Physical Exam Physical Examination:   Filed Vitals:   10/16/14 1120  BP: 125/65  Pulse: 68  Resp: 14   General Examination: Tami Vega is a very pleasant 74 y.o. female in no acute distress.  HEENT: Normocephalic, atraumatic, pupils are equal, round and reactive to light and accommodation. Funduscopic exam is normal with sharp disc margins noted. Extraocular tracking shows mild saccadic breakdown without nystagmus noted. There is limitation to upper gaze. There is mild decrease in eye blink rate. Hearing is very mildly impaired. Face is symmetric with mild facial masking and normal facial sensation. There is no lip, neck or jaw tremor. Neck is mildly rigid with intact passive ROM. There are no carotid  bruits on auscultation. Oropharynx exam reveals moderate mouth dryness. No significant airway crowding is noted. Mallampati is class II. Tongue protrudes centrally and palate elevates symmetrically. There is no drooling.   Chest: is clear to auscultation without wheezing, rhonchi or crackles noted.  Heart: sounds are regular and normal without murmurs, rubs or gallops noted.   Abdomen: is soft, non-tender and non-distended with normal bowel sounds appreciated on auscultation.  Extremities: There is no pitting edema in Tami distal lower extremities bilaterally. Pedal pulses are intact.   Skin: is warm and dry with no trophic changes noted. Age-related changes are noted on Tami skin.   Musculoskeletal: exam reveals no obvious joint deformities, tenderness, joint swelling or erythema.  Neurologically:  Mental status:  Tami Vega is awake and alert, paying good  attention. Tami Vega is able to completely provide Tami history. Tami Vega son provides details. Tami Vega is oriented to: person, place, time/date, situation, day of week, month of year and year. Tami Vega memory, attention, language and knowledge are impaired. There is no aphasia, agnosia, apraxia or anomia. There is a very mild degree of bradyphrenia. Speech is mildly hypophonic with no dysarthria noted. Mood is congruent and affect is normal. Cranial nerves are as described above under HEENT exam. In addition, shoulder shrug is normal with equal shoulder height noted.  Motor exam: Normal bulk, and strength for age is noted. There are no dyskinesias noted.  Tone is mildly rigid with presence of cogwheeling in Tami left upper extremity. There is overall mild bradykinesia. There is no drift or rebound.  There is a mild to moderate resting tremor in Tami left upper extremity and no other resting tremor. Tami tremor is constant and slightly worse from before.  Romberg is negative.  Reflexes are 2+ in Tami upper extremities and 2+ in Tami lower extremities.   Fine motor  skills exam: Finger taps are minimally impaired on Tami right and mildly impaired on Tami left. Hand movements are minimally impaired on Tami right and mildly impaired on Tami left. RAP (rapid alternating patting) is minimally impaired on Tami right and mildly impaired on Tami left. Foot taps are minimally impaired on Tami right and mildly impaired on Tami left. Foot agility (in Tami form of heel stomping) is not impaired on Tami right and mildly impaired on Tami left.    Cerebellar testing shows no dysmetria or intention tremor on finger to nose testing. Heel to shin is unremarkable bilaterally. There is no truncal or gait ataxia.   Sensory exam is intact to light touch, vibration, temperature sense in Tami upper and lower extremities.   Gait, station and balance: Tami Vega stands up from Tami seated position with mild difficulty and does need to push up with Tami Vega hands. Tami Vega needs no assistance. No veering to one side is noted. Tami Vega is not noted to lean Tami Vega. Posture is mildly stooped for age. Stance is narrow-based. Tami Vega walks with decrease in stride length and fairly good pace and decreased arm swing on Tami left and to a lesser degree on Tami R. Tami Vega turns in 3 steps. Tandem walk is difficult for Tami Vega but balance is overall preserved.   Assessment and Plan:   In summary, Takiesha Mcdevitt is a very pleasant 74 year old female with a history and physical exam consistent with left-sided parkinsonism, most likely left-sided predominant Parkinson's disease, tremor predominant. Tami Vega findings are overall mild, but have indeed progressed from when Tami first met Tami Vega. Tami Vega has done well with Azilect, but feels, Tami Vega tremor has become worse and  also Tami Vega walking. Tami Vega balance seems stable. Overall Tami Vega exam is slightly worse from before. Tami Vega, Tami suggested that Tami Vega continue with Azilect 1 mg once daily and we add Sinemet and low dose. To that end, Tami suggested we try it with 25-100 milligram pills, half pill twice daily with gradual increase to 1  pill 3 times a day. Tami talked to Tami Vega and Tami Vega husband about potential side effects. We also talked about expectations and benefit of Tami medication. Tami Vega is encouraged to continue to stay active physically and mentally and drink enough water. Tami would like to see Tami Vega back in 3-4 months, sooner if Tami need arises. Tami provided Tami Vega with a new prescription for Sinemet as well  as written instructions for Tami titration and a refill on Tami Vega Azilect. Tami Vega is encouraged to call for any interim questions or concerns. Tami answered all their questions Tami Vega and Tami Vega and Tami Vega husband were in agreement. Tami spent 20 minutes in total face-to-face time with Tami Vega, more than 50% of which was spent in counseling and coordination of care, reviewing test results, reviewing medication and discussing or reviewing Tami diagnosis of PD, its prognosis and treatment options.

## 2015-01-09 ENCOUNTER — Telehealth: Payer: Self-pay

## 2015-01-09 NOTE — Telephone Encounter (Signed)
I spoke to Tami Vega about changing appt time in November. She was agreeable to time change due to template change.

## 2015-01-31 DIAGNOSIS — Z23 Encounter for immunization: Secondary | ICD-10-CM | POA: Diagnosis not present

## 2015-02-11 ENCOUNTER — Encounter: Payer: Self-pay | Admitting: Neurology

## 2015-02-11 ENCOUNTER — Ambulatory Visit (INDEPENDENT_AMBULATORY_CARE_PROVIDER_SITE_OTHER): Payer: Medicare HMO | Admitting: Neurology

## 2015-02-11 VITALS — BP 158/80 | HR 72 | Resp 16 | Ht 67.0 in | Wt 183.0 lb

## 2015-02-11 DIAGNOSIS — G20A1 Parkinson's disease without dyskinesia, without mention of fluctuations: Secondary | ICD-10-CM

## 2015-02-11 DIAGNOSIS — G20 Parkinson's disease: Secondary | ICD-10-CM | POA: Diagnosis not present

## 2015-02-11 MED ORDER — RASAGILINE MESYLATE 1 MG PO TABS: 1.0000 mg | ORAL_TABLET | Freq: Every day | ORAL | Status: DC

## 2015-02-11 MED ORDER — CARBIDOPA-LEVODOPA 25-100 MG PO TABS: ORAL_TABLET | ORAL | Status: DC

## 2015-02-11 NOTE — Patient Instructions (Signed)
I think your Parkinson's disease has remained fairly stable, which is reassuring. Nevertheless, as you know, this disease does progress with time. It can affect your balance, your memory, your mood, your bowel and bladder function, your posture, balance and walking and your activities of daily living. However, there are good supportive treatments and symptomatic treatments available, so most patients have a change to a good quality life and life expectancy is not typically altered. Overall you are doing fairly well but I do want to suggest a few things today:  Remember to drink plenty of fluid at least 6 glasses (8 oz each), eat healthy meals and do not skip any meals. Try to eat protein with a every meal and eat a healthy snack such as fruit or nuts in between meals. Try to keep a regular sleep-wake schedule and try to exercise daily, particularly in the form of walking, 20-30 minutes a day, if you can.   Taking your medication on schedule is key.   We will request outpatient PT at [facility].   Try to stay active physically and mentally. Engage in social activities in your community and with your family and try to keep up with current events by reading the newspaper or watching the news. Try to do word puzzles and you may like to do puzzles and brain games on the computer such as on [email or web address].   As far as your medications are concerned, I would like to suggest that you take your current medication with the following additional changes: no changes today.    I would like to see you back in 6 months, sooner if we need to. Please call us with any interim questions, concerns, problems, updates or refill requests.  Our phone number is [phone]. We also have an after hours call service for urgent matters and there is a physician on-call for urgent questions, that cannot wait till the next work day. For any emergencies you know to call 911 or go to the nearest emergency room.   You can email  me through my chart and also leave a phone message for Lafonda Mossesiana, my nurse.

## 2015-02-11 NOTE — Progress Notes (Signed)
Subjective:    Patient ID: Tami Vega is a 74 y.o. female.  HPI     Interim history:   Tami Vega is a very pleasant 74 year old right-handed woman with an underlying medical history of hyperlipidemia, history of pituitary microadenoma, precancerous breast lesions status post double mastectomy in the 1980s, who presents for follow-up consultation of her parkinsonism, most likely left-sided predominant Parkinson's disease. The patient is accompanied by her daughter today. I last saw her on 10/16/2014, at which time she reported that her tremor was a little worse. She also felt that her walking was worse. She had a harder time picking up her feet, particularly on the left side. Thankfully she had not fallen. She was sleeping a little better since switching her antidepressant to nighttime. I asked her to continue with Azilect once daily but also to add a small dose of Sinemet starting with half a pill twice daily with gradual titration to 1 pill 3 times a day.   Today, 02/11/2015: She reports doing better with the recent addition of Sinemet, she feels she has more energy and mobility seems better. She is going to change her pharmacy soon and requests paper prescriptions. Her daughter reports that she had stopped taking her antidepressant for a couple of months but the family noticed problems with more depression and anxiety. She is now back on it. She has been trying to stay active. She tries to drink enough water but does not always do so. She does not always eat right and feels that she does not have the same appetite any longer. Overall, she feels that she has done better recently. Unfortunately, she did have some additional stressors recently because her son was involved in a car accident and is still in the hospital but thankfully he is doing better.  Previously:   I saw her on 04/17/2014, at which time she reported doing a little better with regards to her tremor. She noticed a flareup of her  tremor with nervousness. She was still able to use her sewing machine. She had no side effects from her medications. She did find out that she had 2 female cousins on her father's side with Parkinson's disease. I asked her to continue Azilect 1 mg once daily.  I first met her on 10/20/2013 at the request of her primary care physician, at which time she gave a 6-12 month history of left upper extremity tremor. She also reported problems with fine motor skills on the left. Her history and physical exam are in keeping with mild parkinsonism, probably left eye predominant Parkinson's disease. I suggested further workup with a brain MRI and starting her on Azilect.   She did not have a brain scan done.   She has noted L sided issues with fine motor skills, resting tremor on the LUE, she feels easily fatigued and it is difficult for her to pick up her L leg sometimes. Thankfully, she has not fallen, but her balance is off at times. She goes to [employer] 4 times a week, but has less stamina. She is a life long non-smoker, does not drink alcohol and has no history of head injury, exposure to pesticides, but did work in a Emergency planning/management officer for 33 years and [place]. She has no FHx of PD or tremors.    Her Past Medical History Is Significant For: Past Medical History  Diagnosis Date  . Other musculoskeletal symptoms referable to limbs(729.89)   . Pain in joint, lower  leg   . Disorder of bone and cartilage, unspecified   . Pure hypercholesterolemia   . Abnormal involuntary movements(781.0)   . Major depressive disorder, single episode, unspecified ([place])   . Elevated blood pressure reading without diagnosis of hypertension     Her Past Surgical History Is Significant For: Past Surgical History  Procedure Laterality Date  . Vaginal hysterectomy  1975  . Breast surgery  1980's    benign cyst  . Colonoscopy  1995, 1998    Her Family History Is Significant For: Family History  Problem Relation Age  of Onset  . CAD Mother   . CAD Father   . CAD Brother   . Breast cancer Mother   . Breast cancer Maternal Aunt   . Colon cancer Maternal Aunt   . Hyperlipidemia Sister   . Hypertension Sister     Her Social History Is Significant For: Social History   Social History  . Marital Status: Married    Spouse Name: N/A  . Number of Children: N/A  . Years of Education: N/A   Social History Main Topics  . Smoking status: Never Smoker   . Smokeless tobacco: Never Used  . Alcohol Use: No  . Drug Use: No  . Sexual Activity: Not Asked   Other Topics Concern  . None   Social History Narrative    Her Allergies Are:  No Known Allergies:   Her Current Medications Are:  Outpatient Encounter Prescriptions as of 02/11/2015  Medication Sig  . Calcium Carbonate-Vit D-Min (CALCIUM 1200 PO) Take by mouth daily. Take one tablet a day  . carbidopa-levodopa (SINEMET IR) 25-100 MG per tablet Take 1/2 pill twice daily x 1 week, then 1/2 pill 3 times a day x 1 week, then 1 pill 3 times a day thereafter.  . Cholecalciferol (VITAMIN D-3 PO) Take by mouth daily. Take one capsule a day  . cyanocobalamin 1000 MCG tablet Take 100 mcg by mouth daily.  Tami Vega Calcium (STOOL SOFTENER PO) Take by mouth.  . escitalopram (LEXAPRO) 5 MG tablet Take 1 tablet by mouth daily.  . fish oil-omega-3 fatty acids 1000 MG capsule Take 2 g by mouth daily.  . Multiple Vitamin (MULTIVITAMIN) capsule Take 1 capsule by mouth daily.  . Plant Sterols and Stanols (CHOLEST OFF PO) Take by mouth.  . rasagiline (AZILECT) 1 MG TABS tablet Take 1 tablet (1 mg total) by mouth daily.  . [DISCONTINUED] omeprazole (PRILOSEC) 20 MG capsule   . [DISCONTINUED] psyllium (METAMUCIL) 58.6 % powder Take 1 packet by mouth 3 (three) times daily.   No facility-administered encounter medications on file as of 02/11/2015.  :  Review of Systems:  Out of a complete 14 point review of systems, all are reviewed and negative with the  exception of these symptoms as listed below:   Review of Systems  Neurological:       Patient states that Sinemet has helped her, feels like she has more energy now. Reports some coughing spells recently.     Objective:  Neurologic Exam  Physical Exam Physical Examination:   Filed Vitals:   02/11/15 1038  BP: 158/80  Pulse: 72  Resp: 16   General Examination: The patient is a very pleasant 74 y.o. female in no acute distress.  HEENT: Normocephalic, atraumatic, pupils are equal, round and reactive to light and accommodation. Funduscopic exam is normal with sharp disc margins noted. Extraocular tracking shows mild saccadic breakdown without nystagmus noted. There is limitation to upper  gaze. There is mild decrease in eye blink rate. Hearing is very mildly impaired. Face is symmetric with mild facial masking and normal facial sensation. There is no lip, neck or jaw tremor. Neck is mildly rigid with intact passive ROM. There are no carotid bruits on auscultation. Oropharynx exam reveals moderate mouth dryness. No significant airway crowding is noted. Mallampati is class II. Tongue protrudes centrally and palate elevates symmetrically. There is no drooling.   Chest: is clear to auscultation without wheezing, rhonchi or crackles noted.  Heart: sounds are regular and normal without murmurs, rubs or gallops noted.   Abdomen: is soft, non-tender and non-distended with normal bowel sounds appreciated on auscultation.  Extremities: There is no pitting edema in the distal lower extremities bilaterally. Pedal pulses are intact.   Skin: is warm and dry with no trophic changes noted. Age-related changes are noted on the skin.   Musculoskeletal: exam reveals no obvious joint deformities, tenderness, joint swelling or erythema.  Neurologically:  Mental status: The patient is awake and alert, paying good  attention. She is able to completely provide the history. Her daughter provides some details.  She is oriented to: person, place, time/date, situation, day of week, month of year and year. Her memory, attention, language and knowledge are impaired. There is no aphasia, agnosia, apraxia or anomia. There is a very mild degree of bradyphrenia. Speech is mildly hypophonic with no dysarthria noted. Mood is congruent and affect is normal. Cranial nerves are as described above under HEENT exam. In addition, shoulder shrug is normal with equal shoulder height noted.  Motor exam: Normal bulk, and strength for age is noted. There are no dyskinesias noted.  Tone is mildly rigid with presence of cogwheeling in the left upper extremity. There is overall mild bradykinesia. There is no drift or rebound.  There is a mild to moderate resting tremor in the left upper extremity and no other resting tremor. The tremor is constant.  Romberg is negative.  Reflexes are 2+ in the upper extremities and 2+ in the lower extremities.   Fine motor skills exam: Finger taps are minimally impaired on the right and mildly impaired on the left. Hand movements are minimally impaired on the right and mildly impaired on the left. RAP (rapid alternating patting) is minimally impaired on the right and mildly impaired on the left. Foot taps are minimally impaired on the right and mildly impaired on the left. Foot agility (in the form of heel stomping) is not impaired on the right and mildly impaired on the left.    Cerebellar testing shows no dysmetria or intention tremor on finger to nose testing. Heel to shin is unremarkable bilaterally. There is no truncal or gait ataxia.   Sensory exam is intact to light touch in the upper and lower extremities.   Gait, station and balance: She stands up from the seated position with mild difficulty and does need to push up with Her hands. She needs no assistance. No veering to one side is noted. She is not noted to lean today. Posture is mildly stooped for age. Stance is narrow-based. She walks  with decrease in stride length and fairly good pace and decreased arm swing on the left and to a lesser degree on the right. She turns in 3 steps. Tandem walk is difficult for her but balance is overall preserved.   Assessment and Plan:   In summary, Tami Vega is a very pleasant 74 year old female with an underlying medical history of hyperlipidemia,  history of pituitary microadenoma, precancerous breast lesions status post double mastectomy in the 1980s, whose history and physical exam are consistent with left-sided parkinsonism, most likely left-sided predominant Parkinson's disease, tremor predominant. Her findings have been overall mild, but have progressed with time. In July 2016 I suggested we add a small dose of Sinemet. She feels that this has been helpful. She seems to tolerate this quite well. She is pleased with how she is doing. She is advised to try to drink more water and exercise regularly. She does go to the gym. Her daughter has heard about an outpatient Parkinson's program through physical therapy at [facility]. The patient is agreeable to doing physical therapy. To that end, I made a referral today. I also suggested that she continue with Azilect 1 mg once daily and Sinemet 1 pill 3 times a day. She tries to take it at 8 AM, 12 and 4 PM. She knows to take it away from mealtimes. She is encouraged to continue to stay active physically and mentally and drink enough water. I would like to see her back in 6 months, sooner if the need arises. I provided her with new written prescriptions for Sinemet and Azilect. She is encouraged to call for any interim questions or concerns. I answered all their questions today and the patient and her daughter were in agreement. I spent 25 minutes in total face-to-face time with the patient, more than 50% of which was spent in counseling and coordination of care, reviewing test results, reviewing medication and discussing or reviewing the diagnosis of  PD, its prognosis and treatment options.

## 2015-02-28 DIAGNOSIS — G20 Parkinson's disease: Secondary | ICD-10-CM | POA: Diagnosis not present

## 2015-02-28 DIAGNOSIS — M6281 Muscle weakness (generalized): Secondary | ICD-10-CM | POA: Diagnosis not present

## 2015-02-28 DIAGNOSIS — R2689 Other abnormalities of gait and mobility: Secondary | ICD-10-CM | POA: Diagnosis not present

## 2015-03-26 DIAGNOSIS — I951 Orthostatic hypotension: Secondary | ICD-10-CM | POA: Diagnosis not present

## 2015-03-26 DIAGNOSIS — R42 Dizziness and giddiness: Secondary | ICD-10-CM | POA: Diagnosis not present

## 2015-04-15 DIAGNOSIS — M6281 Muscle weakness (generalized): Secondary | ICD-10-CM | POA: Diagnosis not present

## 2015-04-15 DIAGNOSIS — R2689 Other abnormalities of gait and mobility: Secondary | ICD-10-CM | POA: Diagnosis not present

## 2015-04-15 DIAGNOSIS — G20 Parkinson's disease: Secondary | ICD-10-CM | POA: Diagnosis not present

## 2015-05-01 DIAGNOSIS — R2689 Other abnormalities of gait and mobility: Secondary | ICD-10-CM | POA: Diagnosis not present

## 2015-05-01 DIAGNOSIS — G20 Parkinson's disease: Secondary | ICD-10-CM | POA: Diagnosis not present

## 2015-05-01 DIAGNOSIS — M6281 Muscle weakness (generalized): Secondary | ICD-10-CM | POA: Diagnosis not present

## 2015-05-02 DIAGNOSIS — R2689 Other abnormalities of gait and mobility: Secondary | ICD-10-CM | POA: Diagnosis not present

## 2015-05-02 DIAGNOSIS — M6281 Muscle weakness (generalized): Secondary | ICD-10-CM | POA: Diagnosis not present

## 2015-05-02 DIAGNOSIS — G20 Parkinson's disease: Secondary | ICD-10-CM | POA: Diagnosis not present

## 2015-05-06 DIAGNOSIS — G20 Parkinson's disease: Secondary | ICD-10-CM | POA: Diagnosis not present

## 2015-05-06 DIAGNOSIS — R2689 Other abnormalities of gait and mobility: Secondary | ICD-10-CM | POA: Diagnosis not present

## 2015-05-06 DIAGNOSIS — M6281 Muscle weakness (generalized): Secondary | ICD-10-CM | POA: Diagnosis not present

## 2015-05-07 DIAGNOSIS — G20 Parkinson's disease: Secondary | ICD-10-CM | POA: Diagnosis not present

## 2015-05-07 DIAGNOSIS — R05 Cough: Secondary | ICD-10-CM | POA: Diagnosis not present

## 2015-05-07 DIAGNOSIS — J00 Acute nasopharyngitis [common cold]: Secondary | ICD-10-CM | POA: Diagnosis not present

## 2015-05-07 DIAGNOSIS — R2689 Other abnormalities of gait and mobility: Secondary | ICD-10-CM | POA: Diagnosis not present

## 2015-05-07 DIAGNOSIS — M6281 Muscle weakness (generalized): Secondary | ICD-10-CM | POA: Diagnosis not present

## 2015-05-17 DIAGNOSIS — H524 Presbyopia: Secondary | ICD-10-CM | POA: Diagnosis not present

## 2015-05-17 DIAGNOSIS — Z0100 Encounter for examination of eyes and vision without abnormal findings: Secondary | ICD-10-CM | POA: Diagnosis not present

## 2015-05-17 DIAGNOSIS — H35363 Drusen (degenerative) of macula, bilateral: Secondary | ICD-10-CM | POA: Diagnosis not present

## 2015-08-12 ENCOUNTER — Ambulatory Visit (INDEPENDENT_AMBULATORY_CARE_PROVIDER_SITE_OTHER): Payer: Medicare HMO | Admitting: Neurology

## 2015-08-12 ENCOUNTER — Encounter: Payer: Self-pay | Admitting: Neurology

## 2015-08-12 VITALS — BP 112/70 | HR 78 | Resp 16 | Ht 67.0 in | Wt 168.0 lb

## 2015-08-12 DIAGNOSIS — G20 Parkinson's disease: Secondary | ICD-10-CM

## 2015-08-12 NOTE — Patient Instructions (Signed)
I think your Parkinson's disease has remained fairly stable, which is reassuring. Nevertheless, as you know, this disease does progress with time. It can affect your balance, your memory, your mood, your bowel and bladder function, your posture, balance and walking and your activities of daily living. However, there are good supportive treatments and symptomatic treatments available, so most patients have a change to a good quality life and life expectancy is not typically altered. Overall you are doing fairly well but I do want to suggest a few things today:  Remember to drink plenty of fluid at least 6 glasses (8 oz each), eat healthy meals and do not skip any meals. Try to eat protein with a every meal and eat a healthy snack such as fruit or nuts in between meals. Try to keep a regular sleep-wake schedule and try to exercise daily, particularly in the form of walking, 20-30 minutes a day, if you can.   Taking your medication on schedule is key.   Try to stay active physically and mentally. Engage in social activities in your community and with your family and try to keep up with current events by reading the newspaper or watching the news. Try to do word puzzles and you may like to do puzzles and brain games on the computer such as on [email or web address].   As far as your medications are concerned, I would like to suggest that you take your current medication with the following additional changes: no change today, we may increase your Sinemet (levodopa) in the future.     I would like to see you back in 6 months, sooner if we need to. Please call us with any interim questions, concerns, problems, updates or refill requests.  Our phone number is [phone]. We also have an after hours call service for urgent matters and there is a physician on-call for urgent questions, that cannot wait till the next work day. For any emergencies you know to call 911 or go to the nearest emergency room.   You can email  me through my chart and also leave a phone message for Lafonda Mossesiana, my nurse.

## 2015-08-12 NOTE — Progress Notes (Signed)
Subjective:    Patient ID: Tami Vega is a 75 y.o. female.  HPI     Interim history:   Tami Vega is a very pleasant 75 year old right-handed woman with an underlying medical history of hyperlipidemia, history of pituitary microadenoma, precancerous breast lesions status post double mastectomy in the 1980s, who presents for follow-up consultation of her parkinsonism, most likely left-sided predominant Parkinson's disease. The patient is accompanied by her husband today, but he stepped out before I saw her. I last saw her on 02/11/2015, at which time she reported doing better with the recent addition of Sinemet. She felt she had more energy and mobility was better as well. She needed refills on paper prescriptions because of changing her pharmacies. Her daughter reported that she had stopped taking her antidepressant for a couple of months but her family had noticed more problems with her depression and anxiety and she restarted her medication. She was trying to stay active. She is trying to drink enough water. She did have additional stressors lately because her son was involved in a car accident and was still in the hospital at the time. Overall, however, she had done fairly well. We mutually agreed to continue her on Azilect 1 mg once daily and Sinemet 1 pill 3 times a day.  Today, 08/12/2015: She reports feeling fairly stable. She goes to the gym about 4 times a week. She tries to stay active, she has lost weight, mostly because she does not taste things is well. Also, she cut out sodas and has been watching what she eats, trying to drink more water. Overall, motor-wise she feels stable, she has mild short-term memory issues but no mood or sinister memory issues, no recent falls.Sometimes her left hand tremor seems to be worse. She has 3 grown children and 3 grandchildren. She continues to take low-dose Lexapro.  Previously:   I saw her on 10/16/2014, at which time she reported that her  tremor was a little worse. She also felt that her walking was worse. She had a harder time picking up her feet, particularly on the left side. Thankfully she had not fallen. She was sleeping a little better since switching her antidepressant to nighttime. I asked her to continue with Azilect once daily but also to add a small dose of Sinemet starting with half a pill twice daily with gradual titration to 1 pill 3 times a day.   I saw her on 04/17/2014, at which time she reported doing a little better with regards to her tremor. She noticed a flareup of her tremor with nervousness. She was still able to use her sewing machine. She had no side effects from her medications. She did find out that she had 2 female cousins on her father's side with Parkinson's disease. I asked her to continue Azilect 1 mg once daily.  I first met her on 10/20/2013 at the request of her primary care physician, at which time she gave a 6-12 month history of left upper extremity tremor. She also reported problems with fine motor skills on the left. Her history and physical exam are in keeping with mild parkinsonism, probably left eye predominant Parkinson's disease. I suggested further workup with a brain MRI and starting her on Azilect. She did not have a brain scan done.   She has noted L sided issues with fine motor skills, resting tremor on the LUE, she feels easily fatigued and it is difficult for her to pick up her L leg sometimes. Thankfully,  she has not fallen, but her balance is off at times. She goes to [employer] 4 times a week, but has less stamina. She is a life long non-smoker, does not drink alcohol and has no history of head injury, exposure to pesticides, but did work in a Emergency planning/management officer for 33 years and [place]. She has no FHx of PD or tremors.    Her Past Medical History Is Significant For: Past Medical History  Diagnosis Date  . Other musculoskeletal symptoms referable to limbs(729.89)   . Pain in joint,  lower leg   . Disorder of bone and cartilage, unspecified   . Pure hypercholesterolemia   . Abnormal involuntary movements(781.0)   . Major depressive disorder, single episode, unspecified ([place])   . Elevated blood pressure reading without diagnosis of hypertension     Her Past Surgical History Is Significant For: Past Surgical History  Procedure Laterality Date  . Vaginal hysterectomy  1975  . Breast surgery  1980's    benign cyst  . Colonoscopy  1995, 1998    Her Family History Is Significant For: Family History  Problem Relation Age of Onset  . CAD Mother   . CAD Father   . CAD Brother   . Breast cancer Mother   . Breast cancer Maternal Aunt   . Colon cancer Maternal Aunt   . Hyperlipidemia Sister   . Hypertension Sister     Her Social History Is Significant For: Social History   Social History  . Marital Status: Married    Spouse Name: N/A  . Number of Children: N/A  . Years of Education: N/A   Social History Main Topics  . Smoking status: Never Smoker   . Smokeless tobacco: Never Used  . Alcohol Use: No  . Drug Use: No  . Sexual Activity: Not Asked   Other Topics Concern  . None   Social History Narrative    Her Allergies Are:  No Known Allergies:   Her Current Medications Are:  Outpatient Encounter Prescriptions as of 08/12/2015  Medication Sig  . Calcium Carbonate-Vit D-Min (CALCIUM 1200 PO) Take by mouth daily. Take one tablet a day  . carbidopa-levodopa (SINEMET IR) 25-100 MG tablet 1 pill 3 times a day, at 8 AM, 12 and 4 PM, away from mealtimes.  . Cholecalciferol (VITAMIN D-3 PO) Take by mouth daily. Take one capsule a day  . cyanocobalamin 1000 MCG tablet Take 100 mcg by mouth daily.  Mariane Baumgarten Calcium (STOOL SOFTENER PO) Take by mouth.  . escitalopram (LEXAPRO) 5 MG tablet Take 1 tablet by mouth daily.  . fish oil-omega-3 fatty acids 1000 MG capsule Take 2 g by mouth daily.  . Multiple Vitamin (MULTIVITAMIN) capsule Take 1 capsule by mouth  daily.  . rasagiline (AZILECT) 1 MG TABS tablet Take 1 tablet (1 mg total) by mouth daily.  . [DISCONTINUED] Plant Sterols and Stanols (CHOLEST OFF PO) Take by mouth.   No facility-administered encounter medications on file as of 08/12/2015.  :  Review of Systems:  Out of a complete 14 point review of systems, all are reviewed and negative with the exception of these symptoms as listed below:  Review of Systems  Respiratory:       Patient reports that she has episodes of heavy breathing after exertion.    Musculoskeletal:       Arm pain     Objective:  Neurologic Exam  Physical Exam Physical Examination:   Filed Vitals:  08/12/15 1028  BP: 112/70  Pulse: 78  Resp: 16   General Examination: The patient is a very pleasant 75 y.o. female in no acute distress.  HEENT: Normocephalic, atraumatic, pupils are equal, round and reactive to light and accommodation. Funduscopic exam is normal with sharp disc margins noted. Extraocular tracking shows mild saccadic breakdown without nystagmus noted. There is limitation to upper gaze. There is mild decrease in eye blink rate. Hearing is very mildly impaired. Face is symmetric with mild facial masking and normal facial sensation. There is no lip, neck or jaw tremor. Neck is mildly rigid with intact passive ROM. There are no carotid bruits on auscultation. Oropharynx exam reveals moderate mouth dryness. No significant airway crowding is noted. Mallampati is class II. Tongue protrudes centrally and palate elevates symmetrically. There is no drooling.   Chest: is clear to auscultation without wheezing, rhonchi or crackles noted.  Heart: sounds are regular and normal without murmurs, rubs or gallops noted.   Abdomen: is soft, non-tender and non-distended with normal bowel sounds appreciated on auscultation.  Extremities: There is no pitting edema in the distal lower extremities bilaterally. Pedal pulses are intact.   Skin: is warm and dry with  no trophic changes noted. Age-related changes are noted on the skin.   Musculoskeletal: exam reveals no obvious joint deformities, tenderness, joint swelling or erythema.  Neurologically:  Mental status: The patient is awake and alert, paying good  attention. She is able to completely provide the history. Her daughter provides some details. She is oriented to: person, place, time/date, situation, day of week, month of year and year. Her memory, attention, language and knowledge are impaired. There is no aphasia, agnosia, apraxia or anomia. There is a very mild degree of bradyphrenia. Speech is mildly hypophonic with no dysarthria noted. Mood is congruent and affect is normal. Cranial nerves are as described above under HEENT exam. In addition, shoulder shrug is normal with equal shoulder height noted.  Motor exam: Normal bulk, and strength for age is noted. There are no dyskinesias noted, with the exception of very slight intermittent left distal leg and foot dyskinesias.  Tone is mildly rigid with presence of cogwheeling in the left upper extremity. There is overall mild bradykinesia. There is no drift or rebound.  There is a mild to moderate resting tremor in the left upper extremity and no other resting tremor. The tremor is constant.  Romberg is negative.  Reflexes are 2+ in the upper extremities and 2+ in the lower extremities.   Fine motor skills exam: Finger taps are minimally impaired on the right and mildly impaired on the left. Hand movements are minimally impaired on the right and mildly impaired on the left. RAP (rapid alternating patting) is minimally impaired on the right and mildly impaired on the left. Foot taps are minimally impaired on the right and mildly impaired on the left. Foot agility (in the form of heel stomping) is not impaired on the right and mildly impaired on the left.    Cerebellar testing shows no dysmetria or intention tremor on finger to nose testing. Heel to shin is  unremarkable bilaterally. There is no truncal or gait ataxia.   Sensory exam is intact to light touch in the upper and lower extremities.   Gait, station and balance: She stands up from the seated position with mild difficulty and does need to push up with Her hands. She needs no assistance. No veering to one side is noted. She is not noted to  lean today. Posture is mildly stooped for age. Stance is narrow-based. She walks with decrease in stride length and fairly good pace and decreased arm swing on the left and to a lesser degree on the right. She turns in 3 steps. Balance is overall preserved.   Assessment and Plan:   In summary, Tami Vega is a very pleasant 75 year old female with an underlying medical history of hyperlipidemia, history of pituitary microadenoma, precancerous breast lesions status post double mastectomy in the 1980s, who presents for follow-up consultation of her left-sided predominant Parkinson's disease, fairly stable findings and good results with Sinemet which she started in July 2016. She is also on Azilect 1 mg once daily. Sinemet is 1 pill 3 times a day. She continues to stay active, she has lost weight because of dietary changes primarily but she also endorses not tasting food as well. We will monitor her weight. She is advised to continue to take her medication at the current dose and timing. She did physical therapy at [facility] and found this very helpful. In addition, she is reminded to stay active physically and mentally and stay well-hydrated with water.  She did not need any refills today. I think she is stable enough for 6 month checkup. I answered all her questions today and she was in agreement. I spent 25 minutes in total face-to-face time with the patient, more than 50% of which was spent in counseling and coordination of care, reviewing test results, reviewing medication and discussing or reviewing the diagnosis of PD, its prognosis and treatment  options.

## 2015-09-25 DIAGNOSIS — Z0000 Encounter for general adult medical examination without abnormal findings: Secondary | ICD-10-CM | POA: Diagnosis not present

## 2015-09-25 DIAGNOSIS — G20 Parkinson's disease: Secondary | ICD-10-CM | POA: Diagnosis not present

## 2015-09-25 DIAGNOSIS — E7800 Pure hypercholesterolemia, unspecified: Secondary | ICD-10-CM | POA: Diagnosis not present

## 2015-09-25 DIAGNOSIS — R69 Illness, unspecified: Secondary | ICD-10-CM | POA: Diagnosis not present

## 2015-12-04 DIAGNOSIS — E785 Hyperlipidemia, unspecified: Secondary | ICD-10-CM | POA: Diagnosis not present

## 2015-12-04 DIAGNOSIS — R69 Illness, unspecified: Secondary | ICD-10-CM | POA: Diagnosis not present

## 2015-12-04 DIAGNOSIS — Z23 Encounter for immunization: Secondary | ICD-10-CM | POA: Diagnosis not present

## 2015-12-04 DIAGNOSIS — G20 Parkinson's disease: Secondary | ICD-10-CM | POA: Diagnosis not present

## 2015-12-27 DIAGNOSIS — Z1231 Encounter for screening mammogram for malignant neoplasm of breast: Secondary | ICD-10-CM | POA: Diagnosis not present

## 2016-02-10 ENCOUNTER — Ambulatory Visit: Payer: Medicare HMO | Admitting: Neurology

## 2016-02-19 ENCOUNTER — Ambulatory Visit (INDEPENDENT_AMBULATORY_CARE_PROVIDER_SITE_OTHER): Payer: Medicare HMO | Admitting: Neurology

## 2016-02-19 ENCOUNTER — Encounter: Payer: Self-pay | Admitting: Neurology

## 2016-02-19 VITALS — BP 120/74 | HR 84 | Resp 16 | Ht 67.0 in | Wt 168.0 lb

## 2016-02-19 DIAGNOSIS — G20 Parkinson's disease: Secondary | ICD-10-CM

## 2016-02-19 NOTE — Patient Instructions (Addendum)
For your dizziness and lightheadedness, please change positions slowly and drink more water, start using knee high compression socks.   Please call us in about 8 weeks about your refills and which pharmacy you are switching to so you have a smooth transition.

## 2016-02-19 NOTE — Progress Notes (Signed)
Subjective:    Patient ID: Tami Vega is a 75 y.o. female.  HPI     Interim history:   Tami Vega is a very pleasant 75 year old right-handed woman with an underlying medical history of hyperlipidemia, history of pituitary microadenoma, precancerous breast lesions status post double mastectomy in the 1980s, who presents for follow-up consultation of her parkinsonism, most likely left-sided predominant Parkinson's disease. The patient is accompanied by her husband today , but he stepped out before I saw her. I last saw her on 08/12/2015, at which time she reported feeling fairly stable. She was going to the gym about 4 times a week, had lost weight, mostly because of change in taste/lack of taste. Also, she cut out sodas and has been watching what she eats, trying to drink more water. Overall, motor-wise she felt stable, had mild short-term memory issues but no mood or sinister memory issues, no recent falls. Sometimes her left hand tremor was worse. She has 3 grown children and 3 grandchildren. She was on low-dose Lexapro. I suggested she continue with Azilect once daily and Sinemet 1 pill 3 times a day.  Today, 02/19/2016: She reports Overall doing quite well. She stays very active, lives to sew and make different crafts including making tears. She has not had any recent falls but has noticed lightheadedness particularly upon standing up quickly. She had a near fall experience recently at a restaurant. She does admit that she does not always drink enough fluids. She has noted that she will will have less symptoms if she stands up slowly. Otherwise, mood and memory are stable. Motor-wise, she feels stable as well.   Previously:    I saw her on 02/11/2015, at which time she reported doing better with the recent addition of Sinemet. She felt she had more energy and mobility was better as well. She needed refills on paper prescriptions because of changing her pharmacies. Her daughter reported that  she had stopped taking her antidepressant for a couple of months but her family had noticed more problems with her depression and anxiety and she restarted her medication. She was trying to stay active. She is trying to drink enough water. She did have additional stressors lately because her son was involved in a car accident and was still in the hospital at the time. Overall, however, she had done fairly well. We mutually agreed to continue her on Azilect 1 mg once daily and Sinemet 1 pill 3 times a day.   I saw her on 10/16/2014, at which time she reported that her tremor was a little worse. She also felt that her walking was worse. She had a harder time picking up her feet, particularly on the left side. Thankfully she had not fallen. She was sleeping a little better since switching her antidepressant to nighttime. I asked her to continue with Azilect once daily but also to add a small dose of Sinemet starting with half a pill twice daily with gradual titration to 1 pill 3 times a day.    I saw her on 04/17/2014, at which time she reported doing a little better with regards to her tremor. She noticed a flareup of her tremor with nervousness. She was still able to use her sewing machine. She had no side effects from her medications. She did find out that she had 2 female cousins on her father's side with Parkinson's disease. I asked her to continue Azilect 1 mg once daily.   I first met her on 10/20/2013  at the request of her primary care physician, at which time she gave a 6-12 month history of left upper extremity tremor. She also reported problems with fine motor skills on the left. Her history and physical exam are in keeping with mild parkinsonism, probably left eye predominant Parkinson's disease. I suggested further workup with a brain MRI and starting her on Azilect. She did not have a brain scan done.    She has noted L sided issues with fine motor skills, resting tremor on the LUE, she feels easily  fatigued and it is difficult for her to pick up her L leg sometimes. Thankfully, she has not fallen, but her balance is off at times. She goes to [employer] 4 times a week, but has less stamina. She is a life long non-smoker, does not drink alcohol and has no history of head injury, exposure to pesticides, but did work in a Emergency planning/management officer for 33 years and [place]. She has no FHx of PD or tremors.   Her Past Medical History Is Significant For: Past Medical History:  Diagnosis Date  . Abnormal involuntary movements(781.0)   . Disorder of bone and cartilage, unspecified   . Elevated blood pressure reading without diagnosis of hypertension   . Major depressive disorder, single episode, unspecified   . Other musculoskeletal symptoms referable to limbs(729.89)   . Pain in joint, lower leg   . Pure hypercholesterolemia     Her Past Surgical History Is Significant For: Past Surgical History:  Procedure Laterality Date  . BREAST SURGERY  1980's   benign cyst  . COLONOSCOPY  1995, 1998  . VAGINAL HYSTERECTOMY  1975    Her Family History Is Significant For: Family History  Problem Relation Age of Onset  . CAD Mother   . Breast cancer Mother   . CAD Father   . CAD Brother   . Breast cancer Maternal Aunt   . Colon cancer Maternal Aunt   . Hyperlipidemia Sister   . Hypertension Sister     Her Social History Is Significant For: Social History   Social History  . Marital status: Married    Spouse name: N/A  . Number of children: N/A  . Years of education: N/A   Social History Main Topics  . Smoking status: Never Smoker  . Smokeless tobacco: Never Used  . Alcohol use No  . Drug use: No  . Sexual activity: Not on file   Other Topics Concern  . Not on file   Social History Narrative  . No narrative on file    Her Allergies Are:  No Known Allergies:   Her Current Medications Are:  Outpatient Encounter Prescriptions as of 02/19/2016  Medication Sig  . Calcium  Carbonate-Vit D-Min (CALCIUM 1200 PO) Take by mouth daily. Take one tablet a day  . carbidopa-levodopa (SINEMET IR) 25-100 MG tablet 1 pill 3 times a day, at 8 AM, 12 and 4 PM, away from mealtimes.  . Cholecalciferol (VITAMIN D-3 PO) Take by mouth daily. Take one capsule a day  . cyanocobalamin 1000 MCG tablet Take 100 mcg by mouth daily.  Tami Vega Calcium (STOOL SOFTENER PO) Take by mouth.  . escitalopram (LEXAPRO) 5 MG tablet Take 1 tablet by mouth daily.  . fish oil-omega-3 fatty acids 1000 MG capsule Take 2 g by mouth daily.  . Multiple Vitamin (MULTIVITAMIN) capsule Take 1 capsule by mouth daily.  . rasagiline (AZILECT) 1 MG TABS tablet Take 1 tablet (1  mg total) by mouth daily.   No facility-administered encounter medications on file as of 02/19/2016.   :  Review of Systems:  Out of a complete 14 point review of systems, all are reviewed and negative with the exception of these symptoms as listed below: Review of Systems  Neurological:       Patient reports that she has new onset of "dizzy spells" for last 6 months.  She has also noticed a "popping" in her neck for last couple of months.     Objective:  Neurologic Exam  Physical Exam Physical Examination:   Vitals:   02/19/16 1334 02/19/16 1337  BP: (!) 153/81 120/74  Pulse: 69 84  Resp: 16    She does have an over 30 point drop in the systolic blood pressure, has minimal lightheadedness today though.  General Examination: The patient is a very pleasant 75 y.o. female in no acute distress. She is in good spirits today.   HEENT: Normocephalic, atraumatic, pupils are equal, round and reactive to light and accommodation. Extraocular tracking shows mild saccadic breakdown without nystagmus noted. There is limitation to upper gaze. There is mild decrease in eye blink rate. Hearing is very mildly impaired. Face is symmetric with mild facial masking and normal facial sensation. There is no lip, neck or jaw tremor. Neck is mildly  rigid with intact passive ROM. There are no carotid bruits on auscultation. Oropharynx exam reveals moderate mouth dryness. No significant airway crowding is noted. Mallampati is class II. Tongue protrudes centrally and palate elevates symmetrically. There is no drooling.   Chest: is clear to auscultation without wheezing, rhonchi or crackles noted.  Heart: sounds are regular and normal without murmurs, rubs or gallops noted.   Abdomen: is soft, non-tender and non-distended with normal bowel sounds appreciated on auscultation.  Extremities: There is no pitting edema in the distal lower extremities bilaterally. Pedal pulses are intact.   Skin: is warm and dry with no trophic changes noted. Age-related changes are noted on the skin.   Musculoskeletal: exam reveals no obvious joint deformities, tenderness, joint swelling or erythema.  Neurologically:  Mental status: The patient is awake and alert, paying good  attention. She is able to completely provide the history. Her daughter provides some details. She is oriented to: person, place, time/date, situation, day of week, month of year and year. Her memory, attention, language and knowledge are impaired. There is no aphasia, agnosia, apraxia or anomia. There is a very mild degree of bradyphrenia. Speech is mildly hypophonic with no dysarthria noted. Mood is congruent and affect is normal. Cranial nerves are as described above under HEENT exam. In addition, shoulder shrug is normal with equal shoulder height noted.  Motor exam: Normal bulk, and strength for age is noted. There are no dyskinesias noted, with the exception of very mild intermittent lower extremity dyskinesias.  Tone is mildly rigid with presence of cogwheeling in the left upper extremity. There is overall mild bradykinesia. There is no drift or rebound.  There is a mild to moderate resting tremor in the left upper extremity and no other resting tremor. The tremor is constant.  Romberg  is negative.  Reflexes are 2+ in the upper extremities and 2+ in the lower extremities.   Fine motor skills exam: Finger taps are minimally impaired on the right and mildly impaired on the left. Hand movements are minimally impaired on the right and mildly impaired on the left. RAP (rapid alternating patting) is minimally impaired on the right  and mildly impaired on the left. Foot taps are minimally impaired on the right and mildly impaired on the left. Foot agility (in the form of heel stomping) is not impaired on the right and mildly impaired on the left.    Cerebellar testing shows no dysmetria or intention tremor on finger to nose testing. Heel to shin is unremarkable bilaterally. There is no truncal or gait ataxia.   Sensory exam is intact to light touch in the upper and lower extremities.   Gait, station and balance: She stands up from the seated position with mild difficulty and does need to push up with Her hands. She needs no assistance. No veering to one side is noted. She is not noted to lean today. Posture is mildly stooped for age. Stance is narrow-based. She walks with decrease in stride length and fairly good pace and decreased arm swing on the left and to a lesser degree on the right. She turns in 3 steps. Balance is overall preserved.   Assessment and Plan:   In summary, Tami Vega is a very pleasant 75 year old female with an underlying medical history of hyperlipidemia, history of pituitary microadenoma, precancerous breast lesions status post double mastectomy in the 1980s, who presents for follow-up consultation of her left-sided predominant Parkinson's disease with symptoms dating back to 3 1/2 years ago with thankfully fairly stable findings and good results with Sinemet which she started in July 2016. She is also on Azilect 1 mg once daily. Sinemet is 1 pill 3 times a day. She continues to stay active. She had lost weight because of dietary changes primarily but she also  endorsed not tasting food as well, but weight has been stable for the past 6 months. We will continue to monitor her weight. Mood and memory are thankfully stable. She is advised to continue to take her medication at the current dose and timing. For orthostatic hypotension, she is advised to change positions slowly and drink more water, start using knee high compression socks. She did physical therapy at [facility] and found this very helpful. In addition, she is reminded to stay active physically and mentally and stay well-hydrated with water.  She did not need any refills today, but will need to transfer Rx to a new pharmacy due to her insurance. She is reminded to call in a couple of months for this. I think she is stable enough for 6 month checkup. I answered all her questions today and she and her husband were in agreement. I spent 25 minutes in total face-to-face time with the patient, more than 50% of which was spent in counseling and coordination of care, reviewing test results, reviewing medication and discussing or reviewing the diagnosis of PD, its prognosis and treatment options.

## 2016-03-04 DIAGNOSIS — Z0000 Encounter for general adult medical examination without abnormal findings: Secondary | ICD-10-CM | POA: Diagnosis not present

## 2016-03-04 DIAGNOSIS — R7309 Other abnormal glucose: Secondary | ICD-10-CM | POA: Diagnosis not present

## 2016-03-04 DIAGNOSIS — E785 Hyperlipidemia, unspecified: Secondary | ICD-10-CM | POA: Diagnosis not present

## 2016-03-04 DIAGNOSIS — R799 Abnormal finding of blood chemistry, unspecified: Secondary | ICD-10-CM | POA: Diagnosis not present

## 2016-03-04 DIAGNOSIS — K5900 Constipation, unspecified: Secondary | ICD-10-CM | POA: Diagnosis not present

## 2016-03-04 DIAGNOSIS — I951 Orthostatic hypotension: Secondary | ICD-10-CM | POA: Diagnosis not present

## 2016-03-04 DIAGNOSIS — E039 Hypothyroidism, unspecified: Secondary | ICD-10-CM | POA: Diagnosis not present

## 2016-03-04 DIAGNOSIS — R69 Illness, unspecified: Secondary | ICD-10-CM | POA: Diagnosis not present

## 2016-03-04 DIAGNOSIS — G20 Parkinson's disease: Secondary | ICD-10-CM | POA: Diagnosis not present

## 2016-03-04 DIAGNOSIS — Z1389 Encounter for screening for other disorder: Secondary | ICD-10-CM | POA: Diagnosis not present

## 2016-03-13 DIAGNOSIS — M85852 Other specified disorders of bone density and structure, left thigh: Secondary | ICD-10-CM | POA: Diagnosis not present

## 2016-03-13 DIAGNOSIS — Z780 Asymptomatic menopausal state: Secondary | ICD-10-CM | POA: Diagnosis not present

## 2016-03-13 DIAGNOSIS — M8588 Other specified disorders of bone density and structure, other site: Secondary | ICD-10-CM | POA: Diagnosis not present

## 2016-04-06 ENCOUNTER — Telehealth: Payer: Self-pay | Admitting: Neurology

## 2016-04-06 DIAGNOSIS — G20 Parkinson's disease: Secondary | ICD-10-CM

## 2016-04-06 MED ORDER — CARBIDOPA-LEVODOPA 25-100 MG PO TABS: ORAL_TABLET | ORAL | 3 refills | Status: DC

## 2016-04-06 MED ORDER — RASAGILINE MESYLATE 1 MG PO TABS: 1.0000 mg | ORAL_TABLET | Freq: Every day | ORAL | 3 refills | Status: DC

## 2016-04-06 NOTE — Addendum Note (Signed)
Addended by: Crisoforo OxfordURNER, DIANA S on: 04/06/2016 01:52 PM   Modules accepted: Orders

## 2016-04-06 NOTE — Telephone Encounter (Signed)
Patient requesting new Rx's for medications rasagiline (AZILECT) 1 MG TABS tablet and carbidopa-levodopa (SINEMET IR) 25-100 MG tablet called to [facility] on [address] in [place]. The patient has changed pharmacies. I advised Dr. Frances FurbishAthar is out of the office today and the patient says that is ok because she still has a few pills.

## 2016-04-06 NOTE — Telephone Encounter (Signed)
Rx sent to new pharmacy.

## 2016-04-13 ENCOUNTER — Other Ambulatory Visit: Payer: Self-pay | Admitting: Neurology

## 2016-04-15 ENCOUNTER — Other Ambulatory Visit: Payer: Self-pay | Admitting: Neurology

## 2016-04-17 NOTE — Telephone Encounter (Signed)
Patient called states [facility] in [place] did not receive prescription for rasagiline (AZILECT) 1 MG TABS tablet.

## 2016-04-20 NOTE — Telephone Encounter (Signed)
I have located the

## 2016-04-20 NOTE — Telephone Encounter (Signed)
I have located the Rx and will fax back in

## 2016-05-06 DIAGNOSIS — Z9181 History of falling: Secondary | ICD-10-CM | POA: Diagnosis not present

## 2016-05-06 DIAGNOSIS — E039 Hypothyroidism, unspecified: Secondary | ICD-10-CM | POA: Diagnosis not present

## 2016-05-06 DIAGNOSIS — Z79899 Other long term (current) drug therapy: Secondary | ICD-10-CM | POA: Diagnosis not present

## 2016-05-06 DIAGNOSIS — Z0000 Encounter for general adult medical examination without abnormal findings: Secondary | ICD-10-CM | POA: Diagnosis not present

## 2016-05-06 DIAGNOSIS — G20 Parkinson's disease: Secondary | ICD-10-CM | POA: Diagnosis not present

## 2016-05-06 DIAGNOSIS — Z6828 Body mass index (BMI) 28.0-28.9, adult: Secondary | ICD-10-CM | POA: Diagnosis not present

## 2016-05-06 DIAGNOSIS — R69 Illness, unspecified: Secondary | ICD-10-CM | POA: Diagnosis not present

## 2016-05-06 DIAGNOSIS — E7800 Pure hypercholesterolemia, unspecified: Secondary | ICD-10-CM | POA: Diagnosis not present

## 2016-06-03 DIAGNOSIS — R69 Illness, unspecified: Secondary | ICD-10-CM | POA: Diagnosis not present

## 2016-06-03 DIAGNOSIS — M8580 Other specified disorders of bone density and structure, unspecified site: Secondary | ICD-10-CM | POA: Diagnosis not present

## 2016-06-03 DIAGNOSIS — G20 Parkinson's disease: Secondary | ICD-10-CM | POA: Diagnosis not present

## 2016-06-03 DIAGNOSIS — E039 Hypothyroidism, unspecified: Secondary | ICD-10-CM | POA: Diagnosis not present

## 2016-08-18 ENCOUNTER — Ambulatory Visit (INDEPENDENT_AMBULATORY_CARE_PROVIDER_SITE_OTHER): Payer: Medicare HMO | Admitting: Neurology

## 2016-08-18 ENCOUNTER — Encounter: Payer: Self-pay | Admitting: Neurology

## 2016-08-18 VITALS — BP 130/68 | HR 78 | Resp 16 | Ht 67.0 in | Wt 174.0 lb

## 2016-08-18 DIAGNOSIS — G20 Parkinson's disease: Secondary | ICD-10-CM

## 2016-08-18 MED ORDER — CARBIDOPA-LEVODOPA 25-100 MG PO TABS: ORAL_TABLET | ORAL | 3 refills | Status: DC

## 2016-08-18 NOTE — Patient Instructions (Addendum)
  You can try Melatonin at night for sleep: take 1 mg to 3 mg, one to 2 hours before your bedtime. You can go up to 5 mg if needed. It is over the counter and comes in pill form, chewable form and spray, if you prefer.    Please remember to try to maintain good sleep hygiene, which means: Keep a regular sleep and wake schedule, try not to exercise or have a meal within 2 hours of your bedtime, try to keep your bedroom conducive for sleep, that is, cool and dark, without light distractors such as an illuminated alarm clock, and refrain from watching TV right before sleep or in the middle of the night and do not keep the TV or radio on during the night. Also, try not to use or play on electronic devices at bedtime, such as your cell phone, tablet PC or laptop. If you like to read at bedtime on an electronic device, try to dim the background light as much as possible. Do not eat in the middle of the night.   I would like for you to try to increase the Sinemet to 1 pill 4 times a day, at 8 AM, 12, 4 PM and 8 PM.  We will keep the Azilect at 1 mg once daily.

## 2016-08-18 NOTE — Progress Notes (Signed)
Subjective:    Patient ID: Tami Vega is a 76 y.o. female.  HPI     Interim history:   Tami Vega is a very pleasant 76 year old right-handed woman with an underlying medical history of hyperlipidemia, history of pituitary microadenoma, precancerous breast lesions status post double mastectomy in the 1980s, who presents for follow-up consultation of her parkinsonism, most likely left-sided predominant Parkinson's disease. The patient is accompanied by her husband today. I last saw her on 02/19/2016, at which time she reported overall doing quite well, she was active, sewing and making crafts. She had no recent falls but had noticed lightheadedness particularly upon standing up quickly. She had a near fall experience recently at a restaurant. She was not always hydrating well. Mood and memory were stable. Motor-wise, she felt stable as well. I suggested we continue with her medication regimen, including Azilect and Sinemet.Marland Kitchen She was furthermore advised to stay better hydrated and change positions slowly, also start using compression stockings.  Today, 08/18/2016 (all dictated new, as well as above notes, some dictation done in note pad or Word, outside of chart, may appear as copied):  She reports some increase in tremor at times on L, especially, if nervous or when it seems to be time for medication. She has no significant memory or mood issues, some forgetfulness, some difficulty with sleep onset at times, no OTC meds tried. [facility] [place] about 5 days a week. [place] about 3 months ago, was in the yard and shoes were wet and she slipped on the floor in the house, bruised her thigh on the R. Overall, no major changes.   The patient's allergies, current medications, family history, past medical history, past social history, past surgical history and problem list were reviewed and updated as appropriate.   Previously (copied from previous notes for reference):   I saw  her on 08/12/2015, at which time she reported feeling fairly stable. She was going to the gym about 4 times a week, had lost weight, mostly because of change in taste/lack of taste. Also, she cut out sodas and has been watching what she eats, trying to drink more water. Overall, motor-wise she felt stable, had mild short-term memory issues but no mood or sinister memory issues, no recent falls. Sometimes her left hand tremor was worse. She has 3 grown children and 3 grandchildren. She was on low-dose Lexapro. I suggested she continue with Azilect once daily and Sinemet 1 pill 3 times a day.   I saw her on 02/11/2015, at which time she reported doing better with the recent addition of Sinemet. She felt she had more energy and mobility was better as well. She needed refills on paper prescriptions because of changing her pharmacies. Her daughter reported that she had stopped taking her antidepressant for a couple of months but her family had noticed more problems with her depression and anxiety and she restarted her medication. She was trying to stay active. She is trying to drink enough water. She did have additional stressors lately because her son was involved in a car accident and was still in the hospital at the time. Overall, however, she had done fairly well. We mutually agreed to continue her on Azilect 1 mg once daily and Sinemet 1 pill 3 times a day.   I saw her on 10/16/2014, at which time she reported that her tremor was a little worse. She also felt that her walking was worse. She had a harder time  picking up her feet, particularly on the left side. Thankfully she had not fallen. She was sleeping a little better since switching her antidepressant to nighttime. I asked her to continue with Azilect once daily but also to add a small dose of Sinemet starting with half a pill twice daily with gradual titration to 1 pill 3 times a day.    I saw her on 04/17/2014, at which time she reported doing a little  better with regards to her tremor. She noticed a flareup of her tremor with nervousness. She was still able to use her sewing machine. She had no side effects from her medications. She did find out that she had 2 female cousins on her father's side with Parkinson's disease. I asked her to continue Azilect 1 mg once daily.   I first met her on 10/20/2013 at the request of her primary care physician, at which time she gave a 6-12 month history of left upper extremity tremor. She also reported problems with fine motor skills on the left. Her history and physical exam are in keeping with mild parkinsonism, probably left eye predominant Parkinson's disease. I suggested further workup with a brain MRI and starting her on Azilect. She did not have a brain scan done.    She has noted L sided issues with fine motor skills, resting tremor on the LUE, she feels easily fatigued and it is difficult for her to pick up her L leg sometimes. Thankfully, she has not fallen, but her balance is off at times. She goes to [employer] 4 times a week, but has less stamina. She is a life long non-smoker, does not drink alcohol and has no history of head injury, exposure to pesticides, but did work in a Emergency planning/management officer for 33 years and [place]. She has no FHx of PD or tremors.   Her Past Medical History Is Significant For: Past Medical History:  Diagnosis Date  . Abnormal involuntary movements(781.0)   . Disorder of bone and cartilage, unspecified   . Elevated blood pressure reading without diagnosis of hypertension   . Major depressive disorder, single episode, unspecified   . Other musculoskeletal symptoms referable to limbs(729.89)   . Pain in joint, lower leg   . Pure hypercholesterolemia     Her Past Surgical History Is Significant For: Past Surgical History:  Procedure Laterality Date  . BREAST SURGERY  1980's   benign cyst  . COLONOSCOPY  1995, 1998  . VAGINAL HYSTERECTOMY  1975    Her Family History Is  Significant For: Family History  Problem Relation Age of Onset  . CAD Mother   . Breast cancer Mother   . CAD Father   . CAD Brother   . Breast cancer Maternal Aunt   . Colon cancer Maternal Aunt   . Hyperlipidemia Sister   . Hypertension Sister     Her Social History Is Significant For: Social History   Social History  . Marital status: Married    Spouse name: N/A  . Number of children: N/A  . Years of education: N/A   Social History Main Topics  . Smoking status: Never Smoker  . Smokeless tobacco: Never Used  . Alcohol use No  . Drug use: No  . Sexual activity: Not Asked   Other Topics Concern  . None   Social History Narrative  . None    Her Allergies Are:  No Known Allergies:   Her Current Medications Are:  Outpatient  Encounter Prescriptions as of 08/18/2016  Medication Sig  . Calcium Carbonate-Vit D-Min (CALCIUM 1200 PO) Take by mouth daily. Take one tablet a day  . carbidopa-levodopa (SINEMET IR) 25-100 MG tablet 1 pill 3 times a day, at 8 AM, 12 and 4 PM, away from mealtimes.  . Cholecalciferol (VITAMIN D-3 PO) Take by mouth daily. Take one capsule a day  . cyanocobalamin 1000 MCG tablet Take 100 mcg by mouth daily.  Mariane Baumgarten Calcium (STOOL SOFTENER PO) Take by mouth.  . escitalopram (LEXAPRO) 5 MG tablet Take 1 tablet by mouth daily.  . fish oil-omega-3 fatty acids 1000 MG capsule Take 2 g by mouth daily.  . Multiple Vitamin (MULTIVITAMIN) capsule Take 1 capsule by mouth daily.  . rasagiline (AZILECT) 1 MG TABS tablet Take 1 tablet (1 mg total) by mouth daily.   No facility-administered encounter medications on file as of 08/18/2016.   :  Review of Systems:  Out of a complete 14 point review of systems, all are reviewed and negative with the exception of these symptoms as listed below: Review of Systems  Neurological:       Patient states that she has noticed increased tremors in her L hand.     Objective:  Neurologic Exam  Physical  Exam Physical Examination:   Vitals:   08/18/16 1026  BP: 130/68  Pulse: 78  Resp: 16   General Examination: The patient is a very pleasant 76 y.o. female in no acute distress. She appears well-developed and well-nourished and well groomed. Good spirits.   HEENT: Normocephalic, atraumatic, pupils are equal, round and reactive to light and accommodation. Extraocular tracking shows mild saccadic breakdown without nystagmus noted. There is limitation to upper gaze. There is mild decrease in eye blink rate. Hearing is mildly impaired. Face is symmetric with mild facial masking and normal facial sensation. There is no lip, neck or jaw tremor. Neck is mild to moderately rigid with intact passive ROM. There are no carotid bruits on auscultation. Oropharynx exam reveals moderate mouth dryness. No significant airway crowding is noted. Mallampati is class II. Tongue protrudes centrally and palate elevates symmetrically. There is no drooling.   Chest: is clear to auscultation without wheezing, rhonchi or crackles noted.  Heart: sounds are regular and normal without murmurs, rubs or gallops noted.   Abdomen: is soft, non-tender and non-distended with normal bowel sounds appreciated on auscultation.  Extremities: There is no pitting edema in the distal lower extremities bilaterally. L ankle puffy.  Skin: is warm and dry with no trophic changes noted. Age-related changes are noted on the skin.   Musculoskeletal: exam reveals no obvious joint deformities, tenderness, joint swelling or erythema.  Neurologically:  Mental status: The patient is awake and alert, paying good  attention. She is able to completely provide the history. Her husband provides some details. She is oriented to: person, place, time/date, situation, day of week, month of year and year. Her memory, attention, language and knowledge are not particularly impaired. There is no aphasia, agnosia, apraxia or anomia. There is a very mild  degree of bradyphrenia. Speech is mildly hypophonic with no dysarthria noted. Mood is congruent and affect is normal. Cranial nerves are as described above under HEENT exam. In addition, shoulder shrug is normal with equal shoulder height noted.  Motor exam: Normal bulk, and strength for age is noted. There are no dyskinesias noted, with the exception of very mild intermittent upper body dyskinesias; she is not aware of them.  Tone is  mildly rigid with presence of cogwheeling in the left upper extremity. There is overall mild bradykinesia. There is no drift or rebound.  There is a mild intermittent resting tremor in the left upper extremity and no other resting tremor. Romberg is negative. Reflexes are 2+ in the upper extremities and 1+ in the lower extremities.   Fine motor skills exam: Finger taps are minimally impaired on the right and mildly impaired on the left. Hand movements are minimally impaired on the right and mildly impaired on the left. RAP (rapid alternating patting) is minimally impaired on the right and mildly impaired on the left. Foot taps are minimally impaired on the right and mildly impaired on the left. Foot agility (in the form of heel stomping) is not impaired on the right and mildly impaired on the left.    Cerebellar testing shows no dysmetria or intention tremor on finger to nose testing. Heel to shin is unremarkable bilaterally. There is no truncal or gait ataxia.   Sensory exam is intact to light touch in the upper and lower extremities.   Gait, station and balance: She stands up from the seated position with mild difficulty and does need to push up with Her hands. She needs no assistance. No veering to one side is noted. She is not noted to lean today. Posture is mildly stooped for age. Stance is narrow-based. She walks with decrease in stride length and fairly good pace and decreased arm swing on the left and mild increase in tremor of the L hand. She turns in 3 steps.  Balance is overall preserved.   Assessment and Plan:   In summary, Valyncia Wiens is a very pleasant 76 year old female with an underlying medical history of hyperlipidemia, history of pituitary microadenoma, precancerous breast lesions status post double mastectomy in the 1980s, who presents for follow-up consultation of her left-sided predominant Parkinson's disease with symptoms dating back to nearly 4 years ago with thankfully fairly stable findings and good results with Sinemet which she started in July 2016. She is also on Azilect 1 mg once daily, started in July 2015. Sinemet is 1 pill 3 times a day. She continues to stay active. She had lost weight because of dietary changes primarily. Mood and memory are thankfully stable; she had one fall some 3 months ago. She is advised to try to increase the Sinemet to 1 pill 4 times a day, adding an 8 PM dose. She is advised to continue with Azilect once daily in the morning. She has been going to the gym 5 days a week and is encouraged to continue to do so. She is advised to try to stay well-hydrated. I suggested a 6 month follow-up, sooner as needed. I answered all their questions today and the patient and her husband were in agreement. I spent 25 minutes in total face-to-face time with the patient, more than 50% of which was spent in counseling and coordination of care, reviewing test results, reviewing medication and discussing or reviewing the diagnosis of PD, its prognosis and treatment options. Pertinent laboratory and imaging test results that were available during this visit with the patient were reviewed by me and considered in my medical decision making (see chart for details).

## 2016-09-02 DIAGNOSIS — E785 Hyperlipidemia, unspecified: Secondary | ICD-10-CM | POA: Diagnosis not present

## 2016-09-02 DIAGNOSIS — R69 Illness, unspecified: Secondary | ICD-10-CM | POA: Diagnosis not present

## 2016-09-02 DIAGNOSIS — E039 Hypothyroidism, unspecified: Secondary | ICD-10-CM | POA: Diagnosis not present

## 2016-09-03 DIAGNOSIS — H52223 Regular astigmatism, bilateral: Secondary | ICD-10-CM | POA: Diagnosis not present

## 2016-09-03 DIAGNOSIS — H35363 Drusen (degenerative) of macula, bilateral: Secondary | ICD-10-CM | POA: Diagnosis not present

## 2016-09-04 DIAGNOSIS — E7800 Pure hypercholesterolemia, unspecified: Secondary | ICD-10-CM | POA: Diagnosis not present

## 2016-09-04 DIAGNOSIS — E039 Hypothyroidism, unspecified: Secondary | ICD-10-CM | POA: Diagnosis not present

## 2016-10-06 ENCOUNTER — Telehealth: Payer: Self-pay | Admitting: Neurology

## 2016-10-06 DIAGNOSIS — G20 Parkinson's disease: Secondary | ICD-10-CM

## 2016-10-06 MED ORDER — CARBIDOPA-LEVODOPA 25-100 MG PO TABS: ORAL_TABLET | ORAL | 3 refills | Status: DC

## 2016-10-06 NOTE — Telephone Encounter (Signed)
Patient called regarding rx carbipoda she states that Dr. Frances FurbishAthar wanted her to begin taking this medication three times a day instead of two but her pharmacy never received the updated rx. Please call and advise.

## 2016-10-06 NOTE — Telephone Encounter (Signed)
I called pt. She reports that [facility] in [place] did not receive the latest sinemet RX from 08/18/2016, but confirmed that Dr. Frances FurbishAthar did discuss taking the sinemet QID at that office visit. Will send RX [facility] to [facility] and pt will let me know if there are further problems.

## 2016-10-14 ENCOUNTER — Telehealth: Payer: Self-pay | Admitting: Neurology

## 2016-10-14 NOTE — Telephone Encounter (Signed)
Pt's daughter/Kathy called said since she has increased the sinemet to QID they have noticed mood changes, increased depression and anxiety. She said PCP office has adjusted the anxiety medication. Daughter said patient may not have been taking sinemet as directed prior to last appt and the increase in dosing could be the reason for the mood changes. Pt was doing good prior to change in medication. She is going to have PCP office call and  to discuss the medications.

## 2016-10-14 NOTE — Telephone Encounter (Signed)
I called Annice PihJackie, NP back and advised her of this information. She verbalized understanding and will call us back with further questions or concerns.

## 2016-10-14 NOTE — Telephone Encounter (Signed)
I called pt's PCP back that called us earlier, Teena DunkJackie Mawoneke, NP. She advises me that pt has ongoing issues with depression. Pt was taking lexapro 5mg  but this had to be changed because of the interaction with azilect. Pt was changed to rexulti but it appears that the rexulti is not controlling her depression adequately. Annice PihJackie, NP wants to know if Dr. Frances FurbishAthar recommends anything for depression with PD that does not interact with azilect or sinemet.

## 2016-10-14 NOTE — Telephone Encounter (Signed)
Please see other telephone note from today

## 2016-10-14 NOTE — Telephone Encounter (Signed)
I agree with C/L to be continued qid at this time, agree with advice to continue to work with PCP on mood d/o.  Nothing further needed, thx for calling patient and updating Rx

## 2016-10-14 NOTE — Telephone Encounter (Signed)
I called pt, had an extended conversation with her. I advised her that Natalia LeatherwoodKatherine, daughter, had called our office with concerns about pt's sinemet and possible mood changes and depression and anxiety related to the sinemet increase. Pt says that she does not think this is the case. Pt's PCP has been adjusting her anti-depressant medications. Pt was taking lexapro 5 mg daily but that has been discontinued and pt is taking rexulti 1mg  daily now. Pt thinks that her mood and depression symptoms are related to the rexulti. Pt says that she has noticed that about 30 minutes prior to her sinemet dose, she becomes shaky and unsteady. However, once she takes the sinemet, she feels better after about 30-60 minutes. Pt would like to continue the sinemet QID. I asked pt to call her PCP to discuss her symptoms at this point. Pt verbalized understanding.

## 2016-10-14 NOTE — Telephone Encounter (Signed)
Teena DunkJackie Mawoneke, NP is calling to discuss interactions with patient's Parkinson's medication.

## 2016-10-14 NOTE — Telephone Encounter (Signed)
Please call Annice PihJackie, NP, and advise her that there is no concern for interaction between Azilect and Lexapro. Nevertheless, if Lexapro was not helpful and she is off of it, and Rexulti did not help, they may want to consider sending her to geriatric psychiatry as the next step.

## 2016-12-02 DIAGNOSIS — R69 Illness, unspecified: Secondary | ICD-10-CM | POA: Diagnosis not present

## 2016-12-02 DIAGNOSIS — G20 Parkinson's disease: Secondary | ICD-10-CM | POA: Diagnosis not present

## 2016-12-02 DIAGNOSIS — E039 Hypothyroidism, unspecified: Secondary | ICD-10-CM | POA: Diagnosis not present

## 2016-12-07 DIAGNOSIS — L570 Actinic keratosis: Secondary | ICD-10-CM | POA: Diagnosis not present

## 2017-01-04 DIAGNOSIS — Z23 Encounter for immunization: Secondary | ICD-10-CM | POA: Diagnosis not present

## 2017-02-22 ENCOUNTER — Ambulatory Visit: Payer: Medicare HMO | Admitting: Neurology

## 2017-02-22 ENCOUNTER — Encounter: Payer: Self-pay | Admitting: Neurology

## 2017-02-22 VITALS — BP 103/62 | HR 98 | Ht 67.75 in | Wt 170.0 lb

## 2017-02-22 DIAGNOSIS — G20 Parkinson's disease: Secondary | ICD-10-CM

## 2017-02-22 DIAGNOSIS — Z9181 History of falling: Secondary | ICD-10-CM | POA: Diagnosis not present

## 2017-02-22 MED ORDER — RASAGILINE MESYLATE 1 MG PO TABS: 1.0000 mg | ORAL_TABLET | Freq: Every day | ORAL | 3 refills | Status: DC

## 2017-02-22 NOTE — Progress Notes (Signed)
Subjective:    Patient ID: Tami Vega is a 76 y.o. female.  HPI     Interim history:   Tami Vega is a very pleasant 76 year old right-handed woman with an underlying medical history of hyperlipidemia, history of pituitary microadenoma, precancerous breast lesions status post double mastectomy in the 1980s, who presents for follow-up consultation of her parkinsonism, most likely left-sided predominant Parkinson's disease. The patient is accompanied by her daughter today. I last saw her on 08/18/2016, at which time she reported increase in tremor on the left side. She had some difficulty with sleep at times. She had not tried any over-the-counter medication. She had a fall about 3 months prior in the yard. Thankfully she did not sustain any major injuries. She did bruise her right thigh. I suggested she try to increase her Sinemet to 1 pill 4 times a day and keep her Azilect at 1 mg once daily. She was advised to try melatonin for sleep.  Today, 02/22/2017 (all dictated new, as well as above notes, some dictation done in note pad or Word, outside of chart, may appear as copied):  She reports doing okay, sometimes she feels lightheaded or swimmy headed as she puts it. She has fallen backwards at the house a few times when standing up too quickly. She admits that she was trying to stand up too quickly and since then she has been more mindful of taking it slowly. She has thankfully not had any injuries. She has occasional constipation, uses MiraLAX about twice a week typically. She is trying to hydrate well. She goes to the rec center about 5 times a week, she drives to it, she has limited her driving to daytime driving and shorter distances. She is forgetful at times but daughter does not have any major concerns about her cognitive function. She tries to stay active mentally as well. She still enjoys sewing and does her word puzzles on a regular basis.   The patient's allergies, current medications,  family history, past medical history, past social history, past surgical history and problem list were reviewed and updated as appropriate.    Previously (copied from previous notes for reference):    I saw her on 02/19/2016, at which time she reported overall doing quite well, she was active, sewing and making crafts. She had no recent falls but had noticed lightheadedness particularly upon standing up quickly. She had a near fall experience recently at a restaurant. She was not always hydrating well. Mood and memory were stable. Motor-wise, she felt stable as well. I suggested we continue with her medication regimen, including Azilect and Sinemet.Marland Kitchen She was furthermore advised to stay better hydrated and change positions slowly, also start using compression stockings.    I saw her on 08/12/2015, at which time she reported feeling fairly stable. She was going to the gym about 4 times a week, had lost weight, mostly because of change in taste/lack of taste. Also, she cut out sodas and has been watching what she eats, trying to drink more water. Overall, motor-wise she felt stable, had mild short-term memory issues but no mood or sinister memory issues, no recent falls. Sometimes her left hand tremor was worse. She has 3 grown children and 3 grandchildren. She was on low-dose Lexapro. I suggested she continue with Azilect once daily and Sinemet 1 pill 3 times a day.   I saw her on 02/11/2015, at which time she reported doing better with the recent addition of Sinemet. She felt she had  more energy and mobility was better as well. She needed refills on paper prescriptions because of changing her pharmacies. Her daughter reported that she had stopped taking her antidepressant for a couple of months but her family had noticed more problems with her depression and anxiety and she restarted her medication. She was trying to stay active. She is trying to drink enough water. She did have additional stressors lately  because her son was involved in a car accident and was still in the hospital at the time. Overall, however, she had done fairly well. We mutually agreed to continue her on Azilect 1 mg once daily and Sinemet 1 pill 3 times a day.   I saw her on 10/16/2014, at which time she reported that her tremor was a little worse. She also felt that her walking was worse. She had a harder time picking up her feet, particularly on the left side. Thankfully she had not fallen. She was sleeping a little better since switching her antidepressant to nighttime. I asked her to continue with Azilect once daily but also to add a small dose of Sinemet starting with half a pill twice daily with gradual titration to 1 pill 3 times a day.    I saw her on 04/17/2014, at which time she reported doing a little better with regards to her tremor. She noticed a flareup of her tremor with nervousness. She was still able to use her sewing machine. She had no side effects from her medications. She did find out that she had 2 female cousins on her father's side with Parkinson's disease. I asked her to continue Azilect 1 mg once daily.   I first met her on 10/20/2013 at the request of her primary care physician, at which time she gave a 6-12 month history of left upper extremity tremor. She also reported problems with fine motor skills on the left. Her history and physical exam are in keeping with mild parkinsonism, probably left eye predominant Parkinson's disease. I suggested further workup with a brain MRI and starting her on Azilect. She did not have a brain scan done.    She has noted L sided issues with fine motor skills, resting tremor on the LUE, she feels easily fatigued and it is difficult for her to pick up her L leg sometimes. Thankfully, she has not fallen, but her balance is off at times. She goes to [employer] 4 times a week, but has less stamina. She is a life long non-smoker, does not drink alcohol and has no history of head injury,  exposure to pesticides, but did work in a Emergency planning/management officer for 33 years and she [place]. She has no FHx of PD or tremors.   Her Past Medical History Is Significant For: Past Medical History:  Diagnosis Date  . Abnormal involuntary movements(781.0)   . Disorder of bone and cartilage, unspecified   . Elevated blood pressure reading without diagnosis of hypertension   . Major depressive disorder, single episode, unspecified   . Other musculoskeletal symptoms referable to limbs(729.89)   . Pain in joint, lower leg   . Pure hypercholesterolemia     Her Past Surgical History Is Significant For: Past Surgical History:  Procedure Laterality Date  . BREAST SURGERY  1980's   benign cyst  . COLONOSCOPY  1995, 1998  . VAGINAL HYSTERECTOMY  1975    Her Family History Is Significant For: Family History  Problem Relation Age of Onset  . CAD Mother   .  Breast cancer Mother   . CAD Father   . CAD Brother   . Breast cancer Maternal Aunt   . Colon cancer Maternal Aunt   . Hyperlipidemia Sister   . Hypertension Sister     Her Social History Is Significant For: Social History   Socioeconomic History  . Marital status: Married    Spouse name: None  . Number of children: None  . Years of education: None  . Highest education level: None  Social Needs  . Financial resource strain: None  . Food insecurity - worry: None  . Food insecurity - inability: None  . Transportation needs - medical: None  . Transportation needs - non-medical: None  Occupational History  . None  Tobacco Use  . Smoking status: Never Smoker  . Smokeless tobacco: Never Used  Substance and Sexual Activity  . Alcohol use: No    Alcohol/week: 0.0 oz  . Drug use: No  . Sexual activity: None  Other Topics Concern  . None  Social History Narrative  . None    Her Allergies Are:  No Known Allergies:   Her Current Medications Are:  Outpatient Encounter Medications as of 02/22/2017  Medication Sig  .  Calcium Carbonate-Vit D-Min (CALCIUM 1200 PO) Take by mouth daily. Take one tablet a day  . carbidopa-levodopa (SINEMET IR) 25-100 MG tablet 1 pill 4 times a day, at 8 AM, 12, 4 PM and 8 PM, away from mealtimes.  . Cholecalciferol (VITAMIN D-3 PO) Take by mouth daily. Take one capsule a day  . cyanocobalamin 1000 MCG tablet Take 100 mcg by mouth daily.  Mariane Baumgarten Calcium (STOOL SOFTENER PO) Take by mouth.  . escitalopram (LEXAPRO) 10 MG tablet Take 10 mg by mouth daily.  . fish oil-omega-3 fatty acids 1000 MG capsule Take 2 g by mouth daily.  . Multiple Vitamin (MULTIVITAMIN) capsule Take 1 capsule by mouth daily.  . rasagiline (AZILECT) 1 MG TABS tablet Take 1 tablet (1 mg total) by mouth daily.  Marland Kitchen escitalopram (LEXAPRO) 5 MG tablet Take 1 tablet by mouth daily.   No facility-administered encounter medications on file as of 02/22/2017.   :  Review of Systems:  Out of a complete 14 point review of systems, all are reviewed and negative with the exception of these symptoms as listed below: Review of Systems  Neurological:       Pt presents today to discuss her PD. Pt has had a few falls since last being here. Pt stood up quickly and turned and landed on the floor.    Objective:  Neurological Exam  Physical Exam Physical Examination:   Vitals:   02/22/17 0919  BP: 103/62  Pulse: 98    General Examination: The patient is a very pleasant 76 y.o. female in no acute distress. She appears well-developed and well-nourished and well groomed.   HEENT:Normocephalic, atraumatic, pupils are equal, round and reactive to light and accommodation. Extraocular tracking shows mild saccadic breakdown without nystagmus noted. There is limitation to upper gaze. There is mild decrease in eye blink rate. Hearing is mildly impaired. Face is symmetric with mild facial masking and normal facial sensation. There is no lip, neck or jaw tremor. Neck is mild to moderately rigid with intact passive ROM. There  are no carotid bruits on auscultation. Oropharynx exam reveals moderate mouth dryness. No significant airway crowding is noted. Mallampati is class II. Tongue protrudes centrally and palate elevates symmetrically. There is no drooling.   Chest:is clear to auscultation  without wheezing, rhonchi or crackles noted.  Heart:sounds are regular and normal without murmurs, rubs or gallops noted.   Abdomen:is soft, non-tender and non-distended with normal bowel sounds appreciated on auscultation.  Extremities:There is no pitting edema in the distal lower extremities bilaterally. L ankle puffy.  Skin: is warm and dry with no trophic changes noted. Age-related changes are noted on the skin.   Musculoskeletal: exam reveals no obvious joint deformities, tenderness, joint swelling or erythema.  Neurologically:  Mental status: The patient is awake and alert, paying good attention. She is able to completely provide the history. Her husband provides some details. She is oriented to: person, place, time/date, situation, day of week, month of year and year. Her memory, attention, language and knowledge are not particularly impaired. There is no aphasia, agnosia, apraxia or anomia. There is a very mild degree of bradyphrenia. Speech is mildly hypophonic with no dysarthria noted. Mood is congruent and affect is normal. Cranial nerves are as described above under HEENT exam. In addition, shoulder shrug is normal with equal shoulder height noted.  Motor exam: Normal bulk, and strength for age is noted. There are no dyskinesias noted, with the exception of very mild intermittent upper body dyskinesias; she is not aware of them.  Tone is mildly rigid with presence of cogwheeling in the left upper extremity. There is overall mild bradykinesia. There is no drift or rebound.  There is a mild intermittent resting tremor in the left upper extremity and no other resting tremor. Romberg is negative. Reflexes are 2+  in the upper extremities and 1+ in the lower extremities.  Fine motor skills exam: Finger taps are minimally impaired on the right and mildly impaired on the left. Hand movements are minimally impaired on the right and mildly impaired on the left. RAP (rapid alternating patting) is minimally impaired on the right and mildly impaired on the left. Foot taps are minimally impaired on the right and mildly impaired on the left. Foot agility (in the form of heel stomping) is not impaired on the right and mildly impaired on the left.   Cerebellar testing shows no dysmetria or intention tremor on finger to nose testing. Heel to shin is unremarkable bilaterally. There is no truncal or gait ataxia.   Sensory exam is intact to light touch in the upper and lower extremities.   Gait, station and balance: She stands up from the seated position with mild difficulty and does need to push up with Her hands. She needs no assistance. No veering to one side is noted. She is not noted to lean today. Posture is mildly stooped for age. Stance is narrow-based. She walks with decrease in stride length and fairly good pace and decreased arm swing on the left and mild increase in tremor of the L hand. She turns in 3 steps. Balance is overall preserved.   Assessment and Plan:   In summary, Glennette Galster is a very pleasant 76 year old female with an underlying medical history of hyperlipidemia, history of pituitary microadenoma, precancerous breast lesions status post double mastectomy in the 1980s, who presents for follow-up consultation of her left-sided predominant Parkinson's disease with symptoms dating back to about 4 years ago with thankfully fairly stable findings and good results with Sinemet overall. She started C/L in July 2016. She is also on Azilect 1 mg once daily, started this in July 2015. Sinemet was increased last in 5/18 to 1 pill 4 times a day. She has had some falls, thankfully no injuries.  Typically, she  will stand up too quickly and become lightheaded. Her blood pressure is on the lower end of normal and she is advised that autonomic dysregulation can be part of advancing Parkinson's disease. She is reminded to stand up slowly and get her bearings first, stay well-hydrated. She is advised to declutter her home and make sure she has good visibility at night when she has to get up to use the bathroom. We also talked about making the bathroom more safer for her such as grab bars and walk-in shower that may need to be updated in her bathroom. We talked about gait safety today. She continues to stay active. Mood and memory are thankfully stable.She is advised to  be proactive about constipation. She felt increasing Sinemet to 4 times a day was helpful and we mutually agreed to continue with this. She needed a refill on her Azilect but is up-to-date with her Sinemet prescription. I suggested a 6 month follow-up, sooner if needed. I answered all their questions today and the patient and her daughter were in agreement.  I spent 25 minutes in total face-to-face time with the patient, more than 50% of which was spent in counseling and coordination of care, reviewing test results, reviewing medication and discussing or reviewing the diagnosis of PD, its prognosis and treatment options. Pertinent laboratory and imaging test results that were available during this visit with the patient were reviewed by me and considered in my medical decision making (see chart for details).

## 2017-02-22 NOTE — Patient Instructions (Addendum)
Please remember: stand up slowly and turn slowly, no bending down to pick anything, no heavy lifting, be extra careful at night and first thing in the morning. Also, be careful in the Bathroom and the kitchen. Declutter your home, make sure you see well enough at night.  We will keep your medications the same.  Please continue to stay active as you are and well hydrated.  You look stable. Please be really proactive with your constipation medication regimen, titrating as needed to where you have a formed stool at least every other day.

## 2017-02-23 DIAGNOSIS — H6123 Impacted cerumen, bilateral: Secondary | ICD-10-CM | POA: Diagnosis not present

## 2017-03-11 DIAGNOSIS — E039 Hypothyroidism, unspecified: Secondary | ICD-10-CM | POA: Diagnosis not present

## 2017-04-13 DIAGNOSIS — R69 Illness, unspecified: Secondary | ICD-10-CM | POA: Diagnosis not present

## 2017-04-13 DIAGNOSIS — G3184 Mild cognitive impairment, so stated: Secondary | ICD-10-CM | POA: Diagnosis not present

## 2017-04-13 DIAGNOSIS — K08409 Partial loss of teeth, unspecified cause, unspecified class: Secondary | ICD-10-CM | POA: Diagnosis not present

## 2017-04-13 DIAGNOSIS — G20 Parkinson's disease: Secondary | ICD-10-CM | POA: Diagnosis not present

## 2017-04-13 DIAGNOSIS — E039 Hypothyroidism, unspecified: Secondary | ICD-10-CM | POA: Diagnosis not present

## 2017-04-13 DIAGNOSIS — K5900 Constipation, unspecified: Secondary | ICD-10-CM | POA: Diagnosis not present

## 2017-04-13 DIAGNOSIS — Z803 Family history of malignant neoplasm of breast: Secondary | ICD-10-CM | POA: Diagnosis not present

## 2017-04-13 DIAGNOSIS — Z833 Family history of diabetes mellitus: Secondary | ICD-10-CM | POA: Diagnosis not present

## 2017-05-25 DIAGNOSIS — R42 Dizziness and giddiness: Secondary | ICD-10-CM | POA: Diagnosis not present

## 2017-05-25 DIAGNOSIS — S233XXA Sprain of ligaments of thoracic spine, initial encounter: Secondary | ICD-10-CM | POA: Diagnosis not present

## 2017-06-07 DIAGNOSIS — M8580 Other specified disorders of bone density and structure, unspecified site: Secondary | ICD-10-CM | POA: Diagnosis not present

## 2017-06-07 DIAGNOSIS — E039 Hypothyroidism, unspecified: Secondary | ICD-10-CM | POA: Diagnosis not present

## 2017-06-07 DIAGNOSIS — Z1389 Encounter for screening for other disorder: Secondary | ICD-10-CM | POA: Diagnosis not present

## 2017-06-07 DIAGNOSIS — E785 Hyperlipidemia, unspecified: Secondary | ICD-10-CM | POA: Diagnosis not present

## 2017-06-07 DIAGNOSIS — Z0000 Encounter for general adult medical examination without abnormal findings: Secondary | ICD-10-CM | POA: Diagnosis not present

## 2017-06-07 DIAGNOSIS — E559 Vitamin D deficiency, unspecified: Secondary | ICD-10-CM | POA: Diagnosis not present

## 2017-06-07 DIAGNOSIS — G20 Parkinson's disease: Secondary | ICD-10-CM | POA: Diagnosis not present

## 2017-06-17 DIAGNOSIS — W19XXXS Unspecified fall, sequela: Secondary | ICD-10-CM | POA: Diagnosis not present

## 2017-06-17 DIAGNOSIS — S4991XA Unspecified injury of right shoulder and upper arm, initial encounter: Secondary | ICD-10-CM | POA: Diagnosis not present

## 2017-06-17 DIAGNOSIS — S2241XA Multiple fractures of ribs, right side, initial encounter for closed fracture: Secondary | ICD-10-CM | POA: Diagnosis not present

## 2017-06-17 DIAGNOSIS — W19XXXA Unspecified fall, initial encounter: Secondary | ICD-10-CM | POA: Diagnosis not present

## 2017-06-17 DIAGNOSIS — M25511 Pain in right shoulder: Secondary | ICD-10-CM | POA: Diagnosis not present

## 2017-07-01 ENCOUNTER — Other Ambulatory Visit: Payer: Self-pay | Admitting: Neurology

## 2017-07-01 DIAGNOSIS — G20 Parkinson's disease: Secondary | ICD-10-CM

## 2017-07-07 DIAGNOSIS — S4991XS Unspecified injury of right shoulder and upper arm, sequela: Secondary | ICD-10-CM | POA: Diagnosis not present

## 2017-08-04 DIAGNOSIS — S4991XS Unspecified injury of right shoulder and upper arm, sequela: Secondary | ICD-10-CM | POA: Diagnosis not present

## 2017-08-16 DIAGNOSIS — Z1231 Encounter for screening mammogram for malignant neoplasm of breast: Secondary | ICD-10-CM | POA: Diagnosis not present

## 2017-08-30 ENCOUNTER — Ambulatory Visit: Payer: Medicare HMO | Admitting: Neurology

## 2017-08-30 ENCOUNTER — Encounter: Payer: Self-pay | Admitting: Neurology

## 2017-08-30 VITALS — BP 116/73 | HR 87 | Ht 67.5 in | Wt 171.0 lb

## 2017-08-30 DIAGNOSIS — Z9181 History of falling: Secondary | ICD-10-CM | POA: Diagnosis not present

## 2017-08-30 DIAGNOSIS — G20 Parkinson's disease: Secondary | ICD-10-CM

## 2017-08-30 MED ORDER — CARBIDOPA-LEVODOPA ER 50-200 MG PO TBCR
1.0000 | EXTENDED_RELEASE_TABLET | Freq: Every day | ORAL | 3 refills | Status: DC
Start: 2017-08-30 — End: 2018-03-02

## 2017-08-30 NOTE — Patient Instructions (Addendum)
I sorry you had a fall; please hydrate well with water.  Fall risk is real! Please remember to stand up slowly and get your bearings first turn slowly, no bending down to pick anything, no heavy lifting, be extra careful at night and first thing in the morning. Also, be careful in the Bathroom and the kitchen.  Let's keep your Sinemet at 1 pill 4 times a day, but change the timing to 7 AM, 11 AM, 3 PM and 7 PM.  Also, we will add Sinemet CR at bedtime, which is a long acting formulation of levodopa. Take it around 8 or 9 PM when you go to bed. Please continue to stay active mentally and physically.

## 2017-08-30 NOTE — Progress Notes (Signed)
Subjective:    Patient ID: Tami Vega is a 77 y.o. female.  HPI     Interim history:   Tami Vega is a very pleasant 77 year old right-handed woman with an underlying medical history of hyperlipidemia, history of pituitary microadenoma, precancerous breast lesions status post double mastectomy in the 1980s, who presents for follow-up consultation of her parkinsonism, most likely left-sided predominant Parkinson's disease. The patient is accompanied by her sister today. I last saw her on 02/22/17, at which time she reported doing okay, sometimes she felt lightheaded. She had had a few falls, with a tendency to fall backwards when she stood up too quickly. For her occasional constipation she was using MiraLAX as needed about twice a week. I advised her to continue her current Parkinson's disease medication regimen including Sinemet 1 pill 4 times a day and Azilect 1 pill once daily, talked about fall risk and gait safety.  Today, 08/30/17 (all dictated new, as well as above notes, some dictation done in note pad or Word, outside of chart, may appear as copied):  She reports having had one fall in the last 6 months. She fell in the kitchen while taking meds out of the cabinet, felt briefly lightheaded but could not catch herself and fell backwards, bumped her head. She did not seek immediate medical attention but started having rib pain, she was seen in urgent care locally by her primary care physician ordered an x-ray and found that she had cracked a couple of ribs. She was given pain medication but used it only sparingly, not recently. She has noticed that it is harder for her to get started in the mornings, feels that it is difficult to move at times and she shuffles more. She gets up around 5:30, goes to bed around 8 or 9 PM, takes her first dose of Sinemet around 8 AM. Constipation is under control, takes MiraLAX as needed maybe once every 2 weeks or so, tries to hydrate well, is socially and  physically quite active, enjoys quilting still. Sister lives within 5 minutes drive, sister has been in the car behind her while patient is driving and felt she was driving okay, patient has limited her driving to locally familiar routes and daytime driving, no highway driving, she is conscious about it. Her 3 kids live close by, daughter lives closest, oldest son is POA, both sons live within 5-10 minutes drive. No obvious visual or auditory hallucinations, sometimes her feet ache but denies restless leg symptoms, sometimes has nighttime delusions of something under her bed sheet.   The patient's allergies, current medications, family history, past medical history, past social history, past surgical history and problem list were reviewed and updated as appropriate.    Previously (copied from previous notes for reference):   I saw her on 08/18/2016, at which time she reported increase in tremor on the left side. She had some difficulty with sleep at times. She had not tried any over-the-counter medication. She had a fall about 3 months prior in the yard. Thankfully she did not sustain any major injuries. She did bruise her right thigh. I suggested she try to increase her Sinemet to 1 pill 4 times a day and keep her Azilect at 1 mg once daily. She was advised to try melatonin for sleep.   I saw her on 02/19/2016, at which time she reported overall doing quite well, she was active, sewing and making crafts. She had no recent falls but had noticed lightheadedness particularly upon standing  up quickly. She had a near fall experience recently at a restaurant. She was not always hydrating well. Mood and memory were stable. Motor-wise, she felt stable as well. I suggested we continue with her medication regimen, including Azilect and Sinemet.Marland Kitchen She was furthermore advised to stay better hydrated and change positions slowly, also start using compression stockings.   I saw her on 08/12/2015, at which time she  reported feeling fairly stable. She was going to the gym about 4 times a week, had lost weight, mostly because of change in taste/lack of taste. Also, she cut out sodas and has been watching what she eats, trying to drink more water. Overall, motor-wise she felt stable, had mild short-term memory issues but no mood or sinister memory issues, no recent falls. Sometimes her left hand tremor was worse. She has 3 grown children and 3 grandchildren. She was on low-dose Lexapro. I suggested she continue with Azilect once daily and Sinemet 1 pill 3 times a day.   I saw her on 02/11/2015, at which time she reported doing better with the recent addition of Sinemet. She felt she had more energy and mobility was better as well. She needed refills on paper prescriptions because of changing her pharmacies. Her daughter reported that she had stopped taking her antidepressant for a couple of months but her family had noticed more problems with her depression and anxiety and she restarted her medication. She was trying to stay active. She is trying to drink enough water. She did have additional stressors lately because her son was involved in a car accident and was still in the hospital at the time. Overall, however, she had done fairly well. We mutually agreed to continue her on Azilect 1 mg once daily and Sinemet 1 pill 3 times a day.   I saw her on 10/16/2014, at which time she reported that her tremor was a little worse. She also felt that her walking was worse. She had a harder time picking up her feet, particularly on the left side. Thankfully she had not fallen. She was sleeping a little better since switching her antidepressant to nighttime. I asked her to continue with Azilect once daily but also to add a small dose of Sinemet starting with half a pill twice daily with gradual titration to 1 pill 3 times a day.    I saw her on 04/17/2014, at which time she reported doing a little better with regards to her tremor.  She noticed a flareup of her tremor with nervousness. She was still able to use her sewing machine. She had no side effects from her medications. She did find out that she had 2 female cousins on her father's side with Parkinson's disease. I asked her to continue Azilect 1 mg once daily.   I first met her on 10/20/2013 at the request of her primary care physician, at which time she gave a 6-12 month history of left upper extremity tremor. She also reported problems with fine motor skills on the left. Her history and physical exam are in keeping with mild parkinsonism, probably left eye predominant Parkinson's disease. I suggested further workup with a brain MRI and starting her on Azilect. She did not have a brain scan done.    She has noted L sided issues with fine motor skills, resting tremor on the LUE, she feels easily fatigued and it is difficult for her to pick up her L leg sometimes. Thankfully, she has not fallen, but her balance is  off at times. She goes to [employer] 4 times a week, but has less stamina. She is a life long non-smoker, does not drink alcohol and has no history of head injury, exposure to pesticides, but did work in a Emergency planning/management officer for 33 years and she [place]. She has no FHx of PD or tremors.   Her Past Medical History Is Significant For: Past Medical History:  Diagnosis Date  . Abnormal involuntary movements(781.0)   . Disorder of bone and cartilage, unspecified   . Elevated blood pressure reading without diagnosis of hypertension   . Major depressive disorder, single episode, unspecified   . Other musculoskeletal symptoms referable to limbs(729.89)   . Pain in joint, lower leg   . Pure hypercholesterolemia     Her Past Surgical History Is Significant For: Past Surgical History:  Procedure Laterality Date  . BREAST SURGERY  1980's   benign cyst  . COLONOSCOPY  1995, 1998  . VAGINAL HYSTERECTOMY  1975    Her Family History Is Significant For: Family History   Problem Relation Age of Onset  . CAD Mother   . Breast cancer Mother   . CAD Father   . CAD Brother   . Breast cancer Maternal Aunt   . Colon cancer Maternal Aunt   . Hyperlipidemia Sister   . Hypertension Sister     Her Social History Is Significant For: Social History   Socioeconomic History  . Marital status: Married    Spouse name: Not on file  . Number of children: Not on file  . Years of education: Not on file  . Highest education level: Not on file  Occupational History  . Not on file  Social Needs  . Financial resource strain: Not on file  . Food insecurity:    Worry: Not on file    Inability: Not on file  . Transportation needs:    Medical: Not on file    Non-medical: Not on file  Tobacco Use  . Smoking status: Never Smoker  . Smokeless tobacco: Never Used  Substance and Sexual Activity  . Alcohol use: No    Alcohol/week: 0.0 oz  . Drug use: No  . Sexual activity: Not on file  Lifestyle  . Physical activity:    Days per week: Not on file    Minutes per session: Not on file  . Stress: Not on file  Relationships  . Social connections:    Talks on phone: Not on file    Gets together: Not on file    Attends religious service: Not on file    Active member of club or organization: Not on file    Attends meetings of clubs or organizations: Not on file    Relationship status: Not on file  Other Topics Concern  . Not on file  Social History Narrative  . Not on file    Her Allergies Are:  No Known Allergies:   Her Current Medications Are:  Outpatient Encounter Medications as of 08/30/2017  Medication Sig  . Calcium Carbonate-Vit D-Min (CALCIUM 1200 PO) Take by mouth daily. Take one tablet a day  . carbidopa-levodopa (SINEMET IR) 25-100 MG tablet 1 pill 4 times a day, at 8 AM, 12, 4 PM and 8 PM, away from mealtimes.  . Cholecalciferol (VITAMIN D-3 PO) Take by mouth daily. Take one capsule a day  . cyanocobalamin 1000 MCG tablet Take 100 mcg by mouth  daily.  Mariane Baumgarten Calcium (STOOL SOFTENER PO)  Take by mouth.  . escitalopram (LEXAPRO) 10 MG tablet Take 10 mg by mouth daily.  Marland Kitchen escitalopram (LEXAPRO) 5 MG tablet Take 1 tablet by mouth daily.  . fish oil-omega-3 fatty acids 1000 MG capsule Take 2 g by mouth daily.  . Multiple Vitamin (MULTIVITAMIN) capsule Take 1 capsule by mouth daily.  . rasagiline (AZILECT) 1 MG TABS tablet Take 1 tablet (1 mg total) by mouth daily.   No facility-administered encounter medications on file as of 08/30/2017.   :  Review of Systems:  Out of a complete 14 point review of systems, all are reviewed and negative with the exception of these symptoms as listed below: Review of Systems  Neurological:       Pt presents today to follow up on her PD. Pt reports that she has had one fall since her last visit. Pt notices more difficulty in the morning getting going.    Objective:  Neurological Exam  Physical Exam Physical Examination:   Vitals:   08/30/17 0823  BP: 116/73  Pulse: 87   General Examination: The patient is a very pleasant 77 y.o. female in no acute distress. She appears well-developed and well-nourished and well groomed. Good spirits, minimal to mild gen. Dyskinesias.  HEENT:Normocephalic, atraumatic, pupils are equal, round and reactive to light. Corrective eyeglasses in place. Extraocular tracking shows mild saccadic breakdown without nystagmus noted. There is limitation to upper gaze. There is mild decrease in eye blink rate. Hearing is mildly impaired. Face is symmetric with mild facial masking and normal facial sensation. There is no lip, neck or jaw tremor. Neck is mildto moderately rigid with intact ROM. Oropharynx exam reveals moderate mouth dryness. No significant airway crowding is noted. Mallampati is class II. Tongue protrudes centrally and palate elevates symmetrically. There is no drooling.   Chest:is clear to auscultation without wheezing, rhonchi or crackles  noted.  Heart:sounds are regular and normal without murmurs, rubs or gallops noted.   Abdomen:is soft, non-tender and non-distended with normal bowel sounds appreciated on auscultation.  Extremities:There is no pitting edema in the distal lower extremities bilaterally.  Skin: is warm and dry with no trophic changes noted. Age-related changes are noted on the skin.   Musculoskeletal: exam reveals no obvious joint deformities, tenderness, joint swelling or erythema.  Neurologically:  Mental status: The patient is awake and alert, paying good attention. She is able to completely provide the history. Herhusbandprovides some details. She is oriented to: person, place, time/date, situation, day of week, month of year and year. Her memory, attention, language and knowledge arepreserved. There is no aphasia, agnosia, apraxia or anomia. There is a very mild degree of bradyphrenia. Speech is mildly hypophonic with no dysarthria noted. Mood is congruent and affect is normal. Cranial nerves are as described above under HEENT exam. In addition, shoulder shrug is normal with equal shoulder height noted, left shoulder slightly higher perhaps.  Motor exam: Normal bulk, and strength for age is noted. There are mild generalized dyskinesias; she does not seem to be aware of them.  Tone is mildly rigid with presence of cogwheeling in the left upper extremity. There is overall mild bradykinesia. There is no drift or rebound.  There is a mildintermittentresting tremor in the left upper extremity and no other resting tremor. Romberg is not tested d/t safety. Reflexes are 1+ in the upper extremities and1+ in the lower extremities.  Fine motor skills exam: minimally to mildly impaired on the right and mild to moderately impaired on the left  overall.  Cerebellar testing shows no dysmetria or intention tremor on finger to nose testing. Heel to shin is unremarkable bilaterally. There is no truncal or gait  ataxia.   Sensory exam is intact to light touch in the upper and lower extremities.   Gait, station and balance: She stands up from the seated position with mild difficulty and does need to push up with Her hands. She needs no assistance. No veering to one side is noted. She is not noted to lean today. Posture is more stooped than in the past. Stance is narrow-based. She walks with decrease in stride length and fairly good pace and decreased arm swing on the left andmild increase in tremor of the L hand.She turns in 3 steps. Balance is overall fairly preserved.   Assessment and Plan:   In summary, Akeira Lahm is a very pleasant 77 year old female with an underlying medical history of hyperlipidemia, history of pituitary microadenoma, precancerous breast lesions (with s/p double mastectomy in the 1980s), who presents for follow-up consultation of her left-sided predominant Parkinson's disease with symptoms dating back toabout 4 to 5 years ago, with thankfully fairly stable findings for some time, and good results with Sinemet overall. She has had progression over time and recently has had falls. She has mild dyskinesias, not bothersome to her, note hallucinations, no significant constipation, no significant memory impairment. She started C/L in July 2016. She is also on Azilect 1 mg once daily, started this in July 2015. Sinemet was increased in 5/18 to 1 pill 4 times a day. She has had some falls, recently with injury. Typically, she will stand up too quickly and become lightheaded. Her blood pressure is on the lower end of normal and she is advised that autonomic dysregulation can be part of advancing Parkinson's disease. She is reminded to stand up slowly and get her bearings first, stay well-hydrated. She is advised to active mentally and physically. We again talked about the importance of gait safety. Mood and memory are thankfully stable.She is advised to be proactive about constipation.  She felt increasing Sinemet to 4 times a day was helpful and we mutually agreed to continue with this. She is up-to-date with her Sinemet and Azilect prescriptions. I suggested She changed her dose times for Sinemet immediate release to 7, 11, 3 PM and 7 PM. In addition, I would like to see if she can tolerate and benefit from taking Sinemet CR at bedtime, around 8 or 9 PM. She will take Sinemet CR generic 50-200 milligrams strength, I provided a new prescription for 90 days supply. I asked her to get in touch with [place] should she have any questions or problems with the medication addition. I suggested a 6 month follow-up, sooner if needed. I answered all their questions today and the patient and her sister were in agreement.  I spent 25 minutes in total face-to-face time with the patient, more than 50% of which was spent in counseling and coordination of care, reviewing test results, reviewing medication and discussing or reviewing the diagnosis of PD, its prognosis and treatment options. Pertinent laboratory and imaging test results that were available during this visit with the patient were reviewed by me and considered in my medical decision making (see chart for details).

## 2017-09-02 ENCOUNTER — Telehealth: Payer: Self-pay | Admitting: Neurology

## 2017-09-02 NOTE — Telephone Encounter (Signed)
Pt said she took carbidopa-levodopa (SINEMET CR) 50-200 MG tablet but it "knocked her out". She took it before bedtime and did not wake up until 4:30 this morning. She is concerned it could be too strong or maybe she needs to wait and see if her body will get adjusted to it. Please call to advise

## 2017-09-02 NOTE — Telephone Encounter (Signed)
I spoke with Dr. Frances FurbishAthar. She recommends that pt give the sinemet CR 4-5 more days and see if pt feels that is still too sedating.   I called pt. She denies any changes in her health, any brewing infections or cold, or any new medications. She is agreeable to trying the sinemet CR for 4-5 more nights to see if it is more tolerable. Pt will call me back on Monday if she would like to stop the medication. Pt verbalized understanding of the recommendations.

## 2017-09-06 NOTE — Telephone Encounter (Signed)
Pt called stating that Sinemet has been helping her sleep throughout the night but states she has trouble waking up the next morning. Requesting a call to discuss dosing change or new medication

## 2017-09-06 NOTE — Telephone Encounter (Signed)
I think we could potentially go down on the dose of Sinemet CR to 25-100 milligrams strength instead of 50-200. That would require a new prescription as she cannot break the current pill in half. It would mean that she get a new prescription for Sinemet CR generic 25-100 milligrams strength one pill at bedtime. Please inquire if patient is interested in reducing the dose and staying with a CR at bedtime smaller dose. If she would like this, we can send a prescription to her pharmacy of choice.

## 2017-09-06 NOTE — Telephone Encounter (Signed)
I called pt and explained Dr. Teofilo PodAthar's recommendations. Pt would prefer to not start a reduced dose of sinemet CR because the prescriptions are expensive. She will let us know if she changes her mind. She said that she would rather use "creams and massage" to help her legs at night. Pt verbalized understanding of the recommendations.

## 2017-09-09 DIAGNOSIS — R202 Paresthesia of skin: Secondary | ICD-10-CM | POA: Diagnosis not present

## 2017-09-09 DIAGNOSIS — E785 Hyperlipidemia, unspecified: Secondary | ICD-10-CM | POA: Diagnosis not present

## 2017-09-09 DIAGNOSIS — S4991XS Unspecified injury of right shoulder and upper arm, sequela: Secondary | ICD-10-CM | POA: Diagnosis not present

## 2017-09-09 DIAGNOSIS — E039 Hypothyroidism, unspecified: Secondary | ICD-10-CM | POA: Diagnosis not present

## 2017-09-20 DIAGNOSIS — S4991XD Unspecified injury of right shoulder and upper arm, subsequent encounter: Secondary | ICD-10-CM | POA: Diagnosis not present

## 2017-09-20 DIAGNOSIS — X58XXXD Exposure to other specified factors, subsequent encounter: Secondary | ICD-10-CM | POA: Diagnosis not present

## 2017-09-23 ENCOUNTER — Other Ambulatory Visit: Payer: Self-pay | Admitting: Neurology

## 2017-09-23 DIAGNOSIS — G20 Parkinson's disease: Secondary | ICD-10-CM

## 2017-10-04 DIAGNOSIS — S4991XD Unspecified injury of right shoulder and upper arm, subsequent encounter: Secondary | ICD-10-CM | POA: Diagnosis not present

## 2017-10-04 DIAGNOSIS — M25511 Pain in right shoulder: Secondary | ICD-10-CM | POA: Diagnosis not present

## 2017-10-04 DIAGNOSIS — X58XXXD Exposure to other specified factors, subsequent encounter: Secondary | ICD-10-CM | POA: Diagnosis not present

## 2017-10-08 DIAGNOSIS — S4991XD Unspecified injury of right shoulder and upper arm, subsequent encounter: Secondary | ICD-10-CM | POA: Diagnosis not present

## 2017-10-08 DIAGNOSIS — X58XXXD Exposure to other specified factors, subsequent encounter: Secondary | ICD-10-CM | POA: Diagnosis not present

## 2017-10-08 DIAGNOSIS — M25511 Pain in right shoulder: Secondary | ICD-10-CM | POA: Diagnosis not present

## 2017-10-11 DIAGNOSIS — M25511 Pain in right shoulder: Secondary | ICD-10-CM | POA: Diagnosis not present

## 2017-10-11 DIAGNOSIS — X58XXXD Exposure to other specified factors, subsequent encounter: Secondary | ICD-10-CM | POA: Diagnosis not present

## 2017-10-11 DIAGNOSIS — S4991XD Unspecified injury of right shoulder and upper arm, subsequent encounter: Secondary | ICD-10-CM | POA: Diagnosis not present

## 2017-10-18 DIAGNOSIS — M25511 Pain in right shoulder: Secondary | ICD-10-CM | POA: Diagnosis not present

## 2017-10-18 DIAGNOSIS — S4991XD Unspecified injury of right shoulder and upper arm, subsequent encounter: Secondary | ICD-10-CM | POA: Diagnosis not present

## 2017-10-18 DIAGNOSIS — X58XXXD Exposure to other specified factors, subsequent encounter: Secondary | ICD-10-CM | POA: Diagnosis not present

## 2017-11-17 DIAGNOSIS — H524 Presbyopia: Secondary | ICD-10-CM | POA: Diagnosis not present

## 2017-11-17 DIAGNOSIS — H35363 Drusen (degenerative) of macula, bilateral: Secondary | ICD-10-CM | POA: Diagnosis not present

## 2017-12-07 DIAGNOSIS — E039 Hypothyroidism, unspecified: Secondary | ICD-10-CM | POA: Diagnosis not present

## 2017-12-07 DIAGNOSIS — G20 Parkinson's disease: Secondary | ICD-10-CM | POA: Diagnosis not present

## 2017-12-07 DIAGNOSIS — R69 Illness, unspecified: Secondary | ICD-10-CM | POA: Diagnosis not present

## 2018-01-20 DIAGNOSIS — E039 Hypothyroidism, unspecified: Secondary | ICD-10-CM | POA: Diagnosis not present

## 2018-01-20 DIAGNOSIS — Z23 Encounter for immunization: Secondary | ICD-10-CM | POA: Diagnosis not present

## 2018-03-02 ENCOUNTER — Encounter: Payer: Self-pay | Admitting: Neurology

## 2018-03-02 ENCOUNTER — Ambulatory Visit: Payer: Medicare HMO | Admitting: Neurology

## 2018-03-02 VITALS — BP 157/88 | HR 69 | Ht 67.5 in | Wt 176.0 lb

## 2018-03-02 DIAGNOSIS — G20 Parkinson's disease: Secondary | ICD-10-CM | POA: Diagnosis not present

## 2018-03-02 MED ORDER — RASAGILINE MESYLATE 1 MG PO TABS: 1.0000 mg | ORAL_TABLET | Freq: Every day | ORAL | 3 refills | Status: DC

## 2018-03-02 MED ORDER — CARBIDOPA-LEVODOPA 25-100 MG PO TABS: ORAL_TABLET | ORAL | 3 refills | Status: DC

## 2018-03-02 NOTE — Progress Notes (Signed)
Subjective:    Patient ID: Tami Vega is a 77 y.o. female.  HPI     Interim history:   Tami Vega is a very pleasant 77 year old right-handed woman with an underlying medical history of hyperlipidemia, history of pituitary microadenoma, precancerous breast lesions status post double mastectomy in the 1980s, who presents for follow-up consultation of her parkinsonism, most likely left-sided predominant Parkinson's disease. The patient is accompanied by her sister today. I last saw her on 08/30/2017, at which time she reported having had one fall in the past 6 months. She fell in the kitchen. She unfortunately cracked a couple of ribs, had an x-ray through PCP. She noticed that she was more stiffer in the mornings and had difficulty moving at times, shuffling more. We talked about the importance of fall prevention and the importance of being proactive about constipation. She was advised to hydrate well with water and continue with Sinemet 1 pill 4 times a day at 7, 11, 3 and 7. I asked her to add Sinemet CR 50-200 milligrams strength at bedtime. She was advised to continue with Azilect once daily as well.  She called a few days after that in the interim reporting that she felt more sleepy after starting the Sinemet CR. I suggested we could reduce it to 25-100 milligrams strength long-acting but she declined.  Today, 03/02/18 (all dictated new, as well as above notes, some dictation done in note pad or Word, outside of chart, may appear as copied):  She reports no longer taking the Sinemet CR. She had difficulty sleeping at night and felt drowsy the next day. She took it for a few days and stopped it after that. She still reports that mornings are difficult for her in terms of mobility. She has to get up 3 times at night to go to the bathroom on average. She is in bed typically by 8 PM. Her last dose of Sinemet is around 7. She reports trying to exercise on a regular basis, goes to the gym at least 4  times a week at the rec center. She tries to hydrate well and constipation has not been an issue lately. She does not eat as much as she has had taste changes and she eats less them before. She has not had any significant weight loss though. She has not had any recent fall other than the fall we had talked about last time. She still takes Lexapro 10 mg daily, through PCP. Still drives locally, is cautious about her driving.   The patient's allergies, current medications, family history, past medical history, past social history, past surgical history and problem list were reviewed and updated as appropriate.    Previously (copied from previous notes for reference):    I saw her on 02/22/17, at which time she reported doing okay, sometimes she felt lightheaded. She had had a few falls, with a tendency to fall backwards when she stood up too quickly. For her occasional constipation she was using MiraLAX as needed about twice a week. I advised her to continue her current Parkinson's disease medication regimen including Sinemet 1 pill 4 times a day and Azilect 1 pill once daily, talked about fall risk and gait safety.    I saw her on 08/18/2016, at which time she reported increase in tremor on the left side. She had some difficulty with sleep at times. She had not tried any over-the-counter medication. She had a fall about 3 months prior in the yard. Thankfully she did  not sustain any major injuries. She did bruise her right thigh. I suggested she try to increase her Sinemet to 1 pill 4 times a day and keep her Azilect at 1 mg once daily. She was advised to try melatonin for sleep.   I saw her on 02/19/2016, at which time she reported overall doing quite well, she was active, sewing and making crafts. She had no recent falls but had noticed lightheadedness particularly upon standing up quickly. She had a near fall experience recently at a restaurant. She was not always hydrating well. Mood and memory were  stable. Motor-wise, she felt stable as well. I suggested we continue with her medication regimen, including Azilect and Sinemet.Marland Kitchen She was furthermore advised to stay better hydrated and change positions slowly, also start using compression stockings.   I saw her on 08/12/2015, at which time she reported feeling fairly stable. She was going to the gym about 4 times a week, had lost weight, mostly because of change in taste/lack of taste. Also, she cut out sodas and has been watching what she eats, trying to drink more water. Overall, motor-wise she felt stable, had mild short-term memory issues but no mood or sinister memory issues, no recent falls. Sometimes her left hand tremor was worse. She has 3 grown children and 3 grandchildren. She was on low-dose Lexapro. I suggested she continue with Azilect once daily and Sinemet 1 pill 3 times a day.   I saw her on 02/11/2015, at which time she reported doing better with the recent addition of Sinemet. She felt she had more energy and mobility was better as well. She needed refills on paper prescriptions because of changing her pharmacies. Her daughter reported that she had stopped taking her antidepressant for a couple of months but her family had noticed more problems with her depression and anxiety and she restarted her medication. She was trying to stay active. She is trying to drink enough water. She did have additional stressors lately because her son was involved in a car accident and was still in the hospital at the time. Overall, however, she had done fairly well. We mutually agreed to continue her on Azilect 1 mg once daily and Sinemet 1 pill 3 times a day.   I saw her on 10/16/2014, at which time she reported that her tremor was a little worse. She also felt that her walking was worse. She had a harder time picking up her feet, particularly on the left side. Thankfully she had not fallen. She was sleeping a little better since switching her  antidepressant to nighttime. I asked her to continue with Azilect once daily but also to add a small dose of Sinemet starting with half a pill twice daily with gradual titration to 1 pill 3 times a day.    I saw her on 04/17/2014, at which time she reported doing a little better with regards to her tremor. She noticed a flareup of her tremor with nervousness. She was still able to use her sewing machine. She had no side effects from her medications. She did find out that she had 2 female cousins on her father's side with Parkinson's disease. I asked her to continue Azilect 1 mg once daily.   I first met her on 10/20/2013 at the request of her primary care physician, at which time she gave a 6-12 month history of left upper extremity tremor. She also reported problems with fine motor skills on the left. Her history and physical  exam are in keeping with mild parkinsonism, probably left eye predominant Parkinson's disease. I suggested further workup with a brain MRI and starting her on Azilect. She did not have a brain scan done.    She has noted L sided issues with fine motor skills, resting tremor on the LUE, she feels easily fatigued and it is difficult for her to pick up her L leg sometimes. Thankfully, she has not fallen, but her balance is off at times. She goes to [employer] 4 times a week, but has less stamina. She is a life long non-smoker, does not drink alcohol and has no history of head injury, exposure to pesticides, but did work in a Emergency planning/management officer for 33 years and she [place]. She has no FHx of PD or tremors.   Her Past Medical History Is Significant For: Past Medical History:  Diagnosis Date  . Abnormal involuntary movements(781.0)   . Disorder of bone and cartilage, unspecified   . Elevated blood pressure reading without diagnosis of hypertension   . Major depressive disorder, single episode, unspecified   . Other musculoskeletal symptoms referable to limbs(729.89)   . Pain in joint,  lower leg   . Pure hypercholesterolemia     Her Past Surgical History Is Significant For: Past Surgical History:  Procedure Laterality Date  . BREAST SURGERY  1980's   benign cyst  . COLONOSCOPY  1995, 1998  . VAGINAL HYSTERECTOMY  1975    Her Family History Is Significant For: Family History  Problem Relation Age of Onset  . CAD Mother   . Breast cancer Mother   . CAD Father   . CAD Brother   . Breast cancer Maternal Aunt   . Colon cancer Maternal Aunt   . Hyperlipidemia Sister   . Hypertension Sister     Her Social History Is Significant For: Social History   Socioeconomic History  . Marital status: Married    Spouse name: Not on file  . Number of children: Not on file  . Years of education: Not on file  . Highest education level: Not on file  Occupational History  . Not on file  Social Needs  . Financial resource strain: Not on file  . Food insecurity:    Worry: Not on file    Inability: Not on file  . Transportation needs:    Medical: Not on file    Non-medical: Not on file  Tobacco Use  . Smoking status: Never Smoker  . Smokeless tobacco: Never Used  Substance and Sexual Activity  . Alcohol use: No    Alcohol/week: 0.0 standard drinks  . Drug use: No  . Sexual activity: Not on file  Lifestyle  . Physical activity:    Days per week: Not on file    Minutes per session: Not on file  . Stress: Not on file  Relationships  . Social connections:    Talks on phone: Not on file    Gets together: Not on file    Attends religious service: Not on file    Active member of club or organization: Not on file    Attends meetings of clubs or organizations: Not on file    Relationship status: Not on file  Other Topics Concern  . Not on file  Social History Narrative  . Not on file    Her Allergies Are:  No Known Allergies:   Her Current Medications Are:  Outpatient Encounter Medications as of 03/02/2018  Medication  Sig  . Calcium Carbonate-Vit D-Min  (CALCIUM 1200 PO) Take by mouth daily. Take one tablet a day  . carbidopa-levodopa (SINEMET CR) 50-200 MG tablet Take 1 tablet by mouth at bedtime.  . carbidopa-levodopa (SINEMET IR) 25-100 MG tablet TAKE 1 TABLET BY MOUTH 4 TIMES A DAY AT 7 AM, 11 AM, 3 PM and 7 PM.  AWAY FROM MEALTIMES  . Cholecalciferol (VITAMIN D-3 PO) Take by mouth daily. Take one capsule a day  . cyanocobalamin 1000 MCG tablet Take 100 mcg by mouth daily.  Mariane Baumgarten Calcium (STOOL SOFTENER PO) Take by mouth.  . escitalopram (LEXAPRO) 10 MG tablet Take 10 mg by mouth daily.  Marland Kitchen escitalopram (LEXAPRO) 5 MG tablet Take 1 tablet by mouth daily.  . fish oil-omega-3 fatty acids 1000 MG capsule Take 2 g by mouth daily.  Marland Kitchen levothyroxine (SYNTHROID, LEVOTHROID) 75 MCG tablet Take 75 mcg by mouth daily.  . Multiple Vitamin (MULTIVITAMIN) capsule Take 1 capsule by mouth daily.  . rasagiline (AZILECT) 1 MG TABS tablet Take 1 tablet (1 mg total) by mouth daily.   No facility-administered encounter medications on file as of 03/02/2018.   :  Review of Systems:  Out of a complete 14 point review of systems, all are reviewed and negative with the exception of these symptoms as listed below: Review of Systems  Neurological:       Pt presents today to discuss her PD. Pt reports that she "passed out" in the kitchen and hit her head and broke three ribs.    Objective:  Neurological Exam  Physical Exam Physical Examination:   Vitals:   03/02/18 0817  BP: (!) 157/88  Pulse: 69    General Examination: The patient is a very pleasant 77 y.o. female in no acute distress. She appears well-developed and well-nourished and well groomed.   HEENT:Normocephalic, atraumatic, pupils are equal, round and reactive to light. Corrective eyeglasses in place. Extraocular tracking shows mild saccadic breakdown without nystagmus noted. There is limitation to upper gaze. There is mild decrease in eye blink rate. Hearing is mildly impaired. Face is  symmetric with mild to moderate facial masking and normal facial sensation. There is no lip, neck or jaw tremor. Neck is mildto moderately rigid with intact ROM. Oropharynx exam reveals mild mouth dryness. No significant airway crowding is noted. Mallampati is class II. Tongue protrudes centrally and palate elevates symmetrically. There is no drooling.   Chest:is clear to auscultation without wheezing, rhonchi or crackles noted.  Heart:sounds are regular and normal without murmurs, rubs or gallops noted.   Abdomen:is soft, non-tender and non-distended with normal bowel sounds appreciated on auscultation.  Extremities:There is no pitting edema in the distal lower extremities bilaterally.  Skin: is warm and dry with no trophic changes noted. Age-related changes are noted on the skin.   Musculoskeletal: exam reveals no obvious joint deformities, tenderness, joint swelling or erythema.  Neurologically:  Mental status: The patient is awake and alert, paying good attention. She is able to completely provide the history. Herhusbandprovides some details. She is oriented to: person, place, time/date, situation, day of week, month of year and year. Her memory, attention, language and knowledge arepreserved. There is no aphasia, agnosia, apraxia or anomia. There is a very mild degree of bradyphrenia. Speech is mildly hypophonic with no dysarthria noted. Mood is congruent and affect is normal. Cranial nerves are as described above under HEENT exam. In addition, shoulder shrug is normal with equal shoulder height noted, left shoulder slightly higher  perhaps.  Motor exam: Normal bulk, and strength for age is noted. There are no significant dyskinesias.  Tone is mildly rigid with presence of cogwheeling in the left upper extremity. There is overall mild bradykinesia. There is no drift or rebound.  There is a mildintermittentresting tremor in the left upper extremity and no other resting  tremor. Romberg is not tested d/t safety concern. Fine motor skills exam: minimally to mildly impaired on the right and mild to moderately impaired on the left overall.  Cerebellar testing shows no dysmetria or intention tremor on finger to nose testing. Heel to shin is unremarkable bilaterally. There is no truncal or gait ataxia.   Sensory exam is intact to light touch in the upper and lower extremities.   Gait, station and balance: She stands up from the seated position with mild difficulty and does need to push up with Her hands. She needs no assistance. No veering to one side is noted. She is not noted to lean today. Posture is more stooped than in the past. Stance is narrow-based. She walks with decrease in stride length and fairly good pace and decreased arm swing on the left andmild increase in tremor of the L hand.She turns in 3 steps. Balance is overall fairly preserved.   Assessment and Plan:   In summary, Annalese Stiner is a very pleasant 77 year old female with an underlying medical history of hyperlipidemia, history of pituitary microadenoma, precancerous breast lesions (with s/p double mastectomy in the 1980s), who presents for follow-up consultation of her left-sided predominant Parkinson's disease with symptoms dating back toabout5 years ago, with thankfully fairly stable findings for some time, and good results with Sinemetoverall. She has had progression over time and recently has had falls. She has mild dyskinesias, not bothersome to her, no hallucinations, no significant constipation, no significant memory impairment. She started C/Lin July 2016. She is also on Azilect 1 mg once daily, started thisin July 2015. Sinemet was increased in 5/18 to1 pill4times a day. She has had some falls,recently with injury (cracked ribs). Typically, she will stand up too quickly and become lightheaded. She has been much more mindful of this. Her blood pressure is on the lower end of  normal and she is advised that autonomic dysregulation can be part of advancing Parkinson's disease. She is reminded to stand up slowly and get her bearings first, stay well-hydrated. She is advised to stay active mentally and physically. We again talked about the importance of gait safety. Mood and memory are thankfully stable.She is encouraged to continue with Lexapro 10 mg daily. In June 2019 I suggested a trial of Sinemet CR. She did not do well with it and reported daytime drowsiness as well as nighttime difficulty with it. She is no longer on it. She is encouraged to try to increase the Sinemet to a fifth dose in the middle of the night. She takes it currently at 7, 11, 3 PM and 7 PM. She takes the Azilect in the morning with her 7:00 dose. She is advised to try a fifth dose of Sinemet anywhere between 11 PM and 4 AM. She does typically wake up to go to the bathroom, about 3 times per average night. She is agreeable to this trial. She is advised to follow-up routinely in 6 months, sooner if needed. I answered all their questions today and the patient and her sister were in agreement. I spent 25 minutes in total face-to-face time with the patient, more than 50% of which was  spent in counseling and coordination of care, reviewing test results, reviewing medication and discussing or reviewing the diagnosis of PD, its prognosis and treatment options. Pertinent laboratory and imaging test results that were available during this visit with the patient were reviewed by me and considered in my medical decision making (see chart for details).

## 2018-03-02 NOTE — Patient Instructions (Addendum)
Please continue with your exercise regimen.  Please take care of nutrition and hydration with water.  Please be mindful of your fallrisk.  Be proactive about constipation.  We will keep your Azilect, and Sinemet the same; you can take a 5th dose in the middle of the night, anywhere between 11 PM and 4 AM.  We will no longer use the Sinemet CR, as you felt too sleepy from it.

## 2018-03-10 DIAGNOSIS — K5900 Constipation, unspecified: Secondary | ICD-10-CM | POA: Diagnosis not present

## 2018-03-10 DIAGNOSIS — R69 Illness, unspecified: Secondary | ICD-10-CM | POA: Diagnosis not present

## 2018-03-10 DIAGNOSIS — E039 Hypothyroidism, unspecified: Secondary | ICD-10-CM | POA: Diagnosis not present

## 2018-03-10 DIAGNOSIS — G20 Parkinson's disease: Secondary | ICD-10-CM | POA: Diagnosis not present

## 2018-04-25 DIAGNOSIS — H2513 Age-related nuclear cataract, bilateral: Secondary | ICD-10-CM | POA: Diagnosis not present

## 2018-04-25 DIAGNOSIS — H4749 Disorders of optic chiasm in (due to) other disorders: Secondary | ICD-10-CM | POA: Diagnosis not present

## 2018-05-27 DIAGNOSIS — R069 Unspecified abnormalities of breathing: Secondary | ICD-10-CM | POA: Diagnosis not present

## 2018-06-13 DIAGNOSIS — R69 Illness, unspecified: Secondary | ICD-10-CM | POA: Diagnosis not present

## 2018-06-13 DIAGNOSIS — M8580 Other specified disorders of bone density and structure, unspecified site: Secondary | ICD-10-CM | POA: Diagnosis not present

## 2018-06-13 DIAGNOSIS — E039 Hypothyroidism, unspecified: Secondary | ICD-10-CM | POA: Diagnosis not present

## 2018-06-13 DIAGNOSIS — G20 Parkinson's disease: Secondary | ICD-10-CM | POA: Diagnosis not present

## 2018-06-13 DIAGNOSIS — E785 Hyperlipidemia, unspecified: Secondary | ICD-10-CM | POA: Diagnosis not present

## 2018-06-13 DIAGNOSIS — Z1389 Encounter for screening for other disorder: Secondary | ICD-10-CM | POA: Diagnosis not present

## 2018-06-13 DIAGNOSIS — Z0000 Encounter for general adult medical examination without abnormal findings: Secondary | ICD-10-CM | POA: Diagnosis not present

## 2018-08-24 ENCOUNTER — Telehealth: Payer: Self-pay | Admitting: Neurology

## 2018-08-24 NOTE — Telephone Encounter (Signed)
Due to current COVID 19 pandemic, our office is severely reducing in office visits until further notice, in order to minimize the risk to our patients and healthcare providers.    Called patient to offer a virtual visit. Patient states she does not have a computer. Patient accepted an in office visit with some precautions. Patient understands that upon arrival she will have her temperature checked/will be screened for COVID. Patient is aware that only one person is allowed in office with her if necessary and will also have temp checked.

## 2018-08-30 DIAGNOSIS — Z01818 Encounter for other preprocedural examination: Secondary | ICD-10-CM | POA: Diagnosis not present

## 2018-08-30 DIAGNOSIS — H2511 Age-related nuclear cataract, right eye: Secondary | ICD-10-CM | POA: Diagnosis not present

## 2018-08-30 DIAGNOSIS — H40033 Anatomical narrow angle, bilateral: Secondary | ICD-10-CM | POA: Diagnosis not present

## 2018-08-31 ENCOUNTER — Ambulatory Visit: Payer: Medicare HMO | Admitting: Neurology

## 2018-09-05 ENCOUNTER — Other Ambulatory Visit: Payer: Self-pay

## 2018-09-05 ENCOUNTER — Ambulatory Visit: Payer: Medicare HMO | Admitting: Neurology

## 2018-09-05 ENCOUNTER — Encounter: Payer: Self-pay | Admitting: Neurology

## 2018-09-05 VITALS — BP 112/71 | HR 70 | Temp 95.5°F | Ht 67.75 in | Wt 178.0 lb

## 2018-09-05 DIAGNOSIS — Z9181 History of falling: Secondary | ICD-10-CM | POA: Diagnosis not present

## 2018-09-05 DIAGNOSIS — G20 Parkinson's disease: Secondary | ICD-10-CM | POA: Diagnosis not present

## 2018-09-05 MED ORDER — ENTACAPONE 200 MG PO TABS: 200.0000 mg | ORAL_TABLET | Freq: Two times a day (BID) | ORAL | 3 refills | Status: DC

## 2018-09-05 NOTE — Patient Instructions (Addendum)
1. I think it would be a good idea for you to try your sister's Mother-in-law's walker, the one with 2 wheels in the front and sliders in the back, use it in the kitchen and in the bathroom.  Fall risk is real! Please remember to stand up slowly and get your bearings first turn slowly, no bending down to pick anything, no heavy lifting, be extra careful at night and first thing in the morning. Also, be careful in the Bathroom and the kitchen.  2. Constipation can be a big problem in advanced Parkinson's disease. It is important to be proactive: this includes ensuring adequate water intake and mobilization (walking around), utilizing stool softeners as needed, an over-the-counter laxative as needed up to daily if needed, adding a probiotic in pill form or in the form of yogurt can help as well. Sometimes, using a suppository or enema becomes necessary.  3. As discussed, we will add Comtan (entacapone is the generic name) 200 mg strength twice daily, take with your Sinemet dose at 11 AM and 3 PM for now, call for an update in about 2-4 weeks. We can increase the Comtan for all 4 doses if need be. It is designed to enhance the Sinemet effect, make it last longer.  4. We will keep the Sinemet at 4 times a day during the day with 1 pill in the middle of the night, same as before and we will continue with Azilect once daily, same as before.

## 2018-09-05 NOTE — Progress Notes (Signed)
Subjective:    Patient ID: Tami Vega is a 78 y.o. female.  HPI     Interim history:   Ms. Tami Vega is a very pleasant 78 year old right-handed woman with an underlying medical history of hyperlipidemia, history of pituitary microadenoma, precancerous breast lesions status post double mastectomy in the 1980s, who presents for follow-up consultation of her left-sided predominant Parkinson's disease. The patient is accompanied by her sister today. I last saw her on  03/02/2018, at which time she reported that she was not able to tolerate the Sinemet long-acting.  She had problems with insomnia, she was no longer on it.  We had started it in or around June 2019.  She was generally taking Sinemet 4 times a day.  She was having more difficulty in the mornings in terms of mobility.  She was advised to take a middle of the night dose of Sinemet for total of 5 pills/day, the middle of the night dose anywhere between 11 PM and 4 AM.  We talked about the importance of monitoring her driving.  She had limited her driving.  Today, 09/05/2018 (all dictated new, as well as above notes, some dictation done in note pad or Word, outside of chart, may appear as copied):  She reports Feeling slower, she has had some lightheadedness upon standing up quickly and has had a couple of falls in the past 2 weeks.  One time she fell in the kitchen, her son was actually there but could not prevent the fall, she bumped the left posterior part of her head against a chair.  She does not think she lost consciousness, she was fine, son called her sister and reported the fall but he also indicated to the sister that she was fine, she has had no headaches, no one-sided neurological symptoms thankfully.  She takes the Sinemet 4 times a day at 7, 11, 3 PM and 7 PM and 1 in the middle of the night, typically around 1 edge, sometime between midnight and 3 AM.  She is exhausted by the end of the day, often is in bed by 730 or 8.  She has had  some stable visual hallucinations, not bothersome.  Constipation is currently under control thankfully with MiraLAX which she typically takes twice a week.  She does admit that she has a tendency to turn too quickly.  She had her 78th birthday recently and 59th wedding anniversary!  She does feel easily exhausted.  Her sister reports that they can tell by the sound of her voice and by the slowness of her responses that it is time for her next medication dose.  She does feel that at times her Sinemet does not seem to last as long especially after the 11 AM dose.   The patient's allergies, current medications, family history, past medical history, past social history, past surgical history and problem list were reviewed and updated as appropriate.    Previously (copied from previous notes for reference):    I saw her on 08/30/2017, at which time she reported having had one fall in the past 6 months. She fell in the kitchen. She unfortunately cracked a couple of ribs, had an x-ray through PCP. She noticed that she was more stiffer in the mornings and had difficulty moving at times, shuffling more. We talked about the importance of fall prevention and the importance of being proactive about constipation. She was advised to hydrate well with water and continue with Sinemet 1 pill 4 times a day  at 7, 11, 3 and 7. I asked her to add Sinemet CR 50-200 milligrams strength at bedtime. She was advised to continue with Azilect once daily as well.   She called a few days after that in the interim reporting that she felt more sleepy after starting the Sinemet CR. I suggested we could reduce it to 25-100 milligrams strength long-acting but she declined.  I saw her on 02/22/17, at which time she reported doing okay, sometimes she felt lightheaded. She had had a few falls, with a tendency to fall backwards when she stood up too quickly. For her occasional constipation she was using MiraLAX as needed about twice a week. I  advised her to continue her current Parkinson's disease medication regimen including Sinemet 1 pill 4 times a day and Azilect 1 pill once daily, talked about fall risk and gait safety.   I saw her on 08/18/2016, at which time she reported increase in tremor on the left side. She had some difficulty with sleep at times. She had not tried any over-the-counter medication. She had a fall about 3 months prior in the yard. Thankfully she did not sustain any major injuries. She did bruise her right thigh. I suggested she try to increase her Sinemet to 1 pill 4 times a day and keep her Azilect at 1 mg once daily. She was advised to try melatonin for sleep.   I saw her on 02/19/2016, at which time she reported overall doing quite well, she was active, sewing and making crafts. She had no recent falls but had noticed lightheadedness particularly upon standing up quickly. She had a near fall experience recently at a restaurant. She was not always hydrating well. Mood and memory were stable. Motor-wise, she felt stable as well. I suggested we continue with her medication regimen, including Azilect and Sinemet.Marland Kitchen She was furthermore advised to stay better hydrated and change positions slowly, also start using compression stockings.   I saw her on 08/12/2015, at which time she reported feeling fairly stable. She was going to the gym about 4 times a week, had lost weight, mostly because of change in taste/lack of taste. Also, she cut out sodas and has been watching what she eats, trying to drink more water. Overall, motor-wise she felt stable, had mild short-term memory issues but no mood or sinister memory issues, no recent falls. Sometimes her left hand tremor was worse. She has 3 grown children and 3 grandchildren. She was on low-dose Lexapro. I suggested she continue with Azilect once daily and Sinemet 1 pill 3 times a day.   I saw her on 02/11/2015, at which time she reported doing better with the recent addition of  Sinemet. She felt she had more energy and mobility was better as well. She needed refills on paper prescriptions because of changing her pharmacies. Her daughter reported that she had stopped taking her antidepressant for a couple of months but her family had noticed more problems with her depression and anxiety and she restarted her medication. She was trying to stay active. She is trying to drink enough water. She did have additional stressors lately because her son was involved in a car accident and was still in the hospital at the time. Overall, however, she had done fairly well. We mutually agreed to continue her on Azilect 1 mg once daily and Sinemet 1 pill 3 times a day.   I saw her on 10/16/2014, at which time she reported that her tremor was a little  worse. She also felt that her walking was worse. She had a harder time picking up her feet, particularly on the left side. Thankfully she had not fallen. She was sleeping a little better since switching her antidepressant to nighttime. I asked her to continue with Azilect once daily but also to add a small dose of Sinemet starting with half a pill twice daily with gradual titration to 1 pill 3 times a day.    I saw her on 04/17/2014, at which time she reported doing a little better with regards to her tremor. She noticed a flareup of her tremor with nervousness. She was still able to use her sewing machine. She had no side effects from her medications. She did find out that she had 2 female cousins on her father's side with Parkinson's disease. I asked her to continue Azilect 1 mg once daily.   I first met her on 10/20/2013 at the request of her primary care physician, at which time she gave a 6-12 month history of left upper extremity tremor. She also reported problems with fine motor skills on the left. Her history and physical exam are in keeping with mild parkinsonism, probably left eye predominant Parkinson's disease. I suggested further workup with a  brain MRI and starting her on Azilect. She did not have a brain scan done.    She has noted L sided issues with fine motor skills, resting tremor on the LUE, she feels easily fatigued and it is difficult for her to pick up her L leg sometimes. Thankfully, she has not fallen, but her balance is off at times. She goes to [employer] 4 times a week, but has less stamina. She is a life long non-smoker, does not drink alcohol and has no history of head injury, exposure to pesticides, but did work in a Emergency planning/management officer for 33 years and she [place]. She has no FHx of PD or tremors.   Her Past Medical History Is Significant For: Past Medical History:  Diagnosis Date  . Abnormal involuntary movements(781.0)   . Disorder of bone and cartilage, unspecified   . Elevated blood pressure reading without diagnosis of hypertension   . Major depressive disorder, single episode, unspecified   . Other musculoskeletal symptoms referable to limbs(729.89)   . Pain in joint, lower leg   . Pure hypercholesterolemia     Her Past Surgical History Is Significant For: Past Surgical History:  Procedure Laterality Date  . BREAST SURGERY  1980's   benign cyst  . COLONOSCOPY  1995, 1998  . VAGINAL HYSTERECTOMY  1975    Her Family History Is Significant For: Family History  Problem Relation Age of Onset  . CAD Mother   . Breast cancer Mother   . CAD Father   . CAD Brother   . Breast cancer Maternal Aunt   . Colon cancer Maternal Aunt   . Hyperlipidemia Sister   . Hypertension Sister     Her Social History Is Significant For: Social History   Socioeconomic History  . Marital status: Married    Spouse name: Not on file  . Number of children: Not on file  . Years of education: Not on file  . Highest education level: Not on file  Occupational History  . Not on file  Social Needs  . Financial resource strain: Not on file  . Food insecurity:    Worry: Not on file    Inability: Not on file  .  Transportation  needs:    Medical: Not on file    Non-medical: Not on file  Tobacco Use  . Smoking status: Never Smoker  . Smokeless tobacco: Never Used  Substance and Sexual Activity  . Alcohol use: No    Alcohol/week: 0.0 standard drinks  . Drug use: No  . Sexual activity: Not on file  Lifestyle  . Physical activity:    Days per week: Not on file    Minutes per session: Not on file  . Stress: Not on file  Relationships  . Social connections:    Talks on phone: Not on file    Gets together: Not on file    Attends religious service: Not on file    Active member of club or organization: Not on file    Attends meetings of clubs or organizations: Not on file    Relationship status: Not on file  Other Topics Concern  . Not on file  Social History Narrative  . Not on file    Her Allergies Are:  No Known Allergies:   Her Current Medications Are:  Outpatient Encounter Medications as of 09/05/2018  Medication Sig  . Calcium Carbonate-Vit D-Min (CALCIUM 1200 PO) Take by mouth daily. Take one tablet a day  . carbidopa-levodopa (SINEMET IR) 25-100 MG tablet TAKE 1 TABLET BY MOUTH 4 TIMES A DAY AT 7 AM, 11 AM, 3 PM and 7 PM, and one if needed in the middle of the night. AWAY FROM MEALTIMES  . Cholecalciferol (VITAMIN D-3 PO) Take by mouth daily. Take one capsule a day  . cyanocobalamin 1000 MCG tablet Take 100 mcg by mouth daily.  Mariane Baumgarten Calcium (STOOL SOFTENER PO) Take by mouth.  . escitalopram (LEXAPRO) 10 MG tablet Take 10 mg by mouth daily.  . fish oil-omega-3 fatty acids 1000 MG capsule Take 2 g by mouth daily.  Marland Kitchen levothyroxine (SYNTHROID, LEVOTHROID) 75 MCG tablet Take 75 mcg by mouth daily.  . Multiple Vitamin (MULTIVITAMIN) capsule Take 1 capsule by mouth daily.  . rasagiline (AZILECT) 1 MG TABS tablet Take 1 tablet (1 mg total) by mouth daily.   No facility-administered encounter medications on file as of 09/05/2018.   :  Review of Systems:  Out of a complete 14 point  review of systems, all are reviewed and negative with the exception of these symptoms as listed below: Review of Systems  Neurological:       Pt presents today to follow up on her PD. Pt reports some recent falls.    Objective:  Neurological Exam  Physical Exam Physical Examination:   Vitals:   09/05/18 0937  BP: 112/71  Pulse: 70  Temp: (!) 95.5 F (35.3 C)    General Examination: The patient is a very pleasant 78 y.o. female in no acute distress. She appears well-developed and well-nourished and well groomed.   HEENT:Normocephalic, atraumatic, pupils are equal, round and reactive to light. Corrective eyeglasses in place, Bilateral cataracts noted. Extraocular tracking shows mild saccadic breakdown without nystagmus noted. There is limitation to upper gaze. There is mild decrease in eye blink rate. Hearing is mildly impaired. Face is symmetric with moderate facial masking and normal facial sensation. There is no lip, neck or jaw tremor. Neck is mildto moderately rigid with intact ROM. Oropharynx exam reveals mild mouth dryness. No significant airway crowding is noted. Mallampati is class II. Tongue protrudes centrally and palate elevates symmetrically. There is no drooling.   Chest:is clear to auscultation without wheezing, rhonchi or crackles noted.  Heart:sounds are regular and normal without murmurs, rubs or gallops noted.   Abdomen:is soft, non-tender and non-distended with normal bowel sounds appreciated on auscultation.  Extremities:There is no pitting edema in the distal lower extremities bilaterally.  Skin: is warm and dry with no trophic changes noted. Age-related changes are noted on the skin.   Musculoskeletal: exam reveals no obvious joint deformities, tenderness, joint swelling or erythema.  Neurologically:  Mental status: The patient is awake and alert, paying good attention. She is able to completely provide the history. Hersister provides some  details. She is oriented to: person, place, time/date, situation, day of week, month of year and year. Her memory, attention, language and knowledge arepreserved to very mildly impaired. There is no aphasia, agnosia, apraxia or anomia. There is a very mild degree of bradyphrenia. Speech is mildly hypophonic with no dysarthria noted. Mood is congruent and affect is normal. Cranial nerves are as described above under HEENT exam. In addition, shoulder shrug is normal with equal shoulder height noted, left shoulder slightly higher perhaps.  Motor exam: Normal bulk, and strength for age is noted. There are Very mild generalized dyskinesias today, intermittent.   Tone is mildly rigid with presence of cogwheeling in the left upper extremity. There is overall mild to moderate bradykinesia. There is no drift or rebound.  There is a mildresting tremor in the left upper extremity and no other resting tremor. Romberg isnot tested d/t safety concern.Fine motor skills exam:mildly impaired on the right and more moderately impaired on the left overall.  Cerebellar testing shows no dysmetria or intention tremor on finger to nose testing. there is no truncal or gait ataxia.   Sensory exam is intact to light touch in the upper and lower extremities.   Gait, station and balance: She stands up from the seated position with mild difficulty and does need to push up with Her hands. She needs no assistance. No veering to one side is noted. She is not noted to lean today. Posture is moderately stooped. Stance is narrow-based. She walks with decrease in stride length and fairly good pace and decreased arm swing on the left andmild increase in tremor of the L hand.She turns in 3 steps. Balance is overallfairlypreserved.   Assessment and Plan:   In summary, Mina Babula is a very pleasant 78 year old female with an underlying medical history of hyperlipidemia, history of pituitary microadenoma, precancerous breast  lesions(withs/pdouble mastectomy in the 1980s), who presents for follow-up consultation of her left-sided predominant Parkinson's disease with symptoms dating back toabout5-6years ago,with thankfully fairly stable findings for quite some time,and good results with Sinemetoverall.She has had progression over time and recently has had falls. She has mild dyskinesias, not bothersome to her, mild intermittent visual hallucinations, some constipation, no significant memory impairment.She started C/Lin July 2016. She is also on Azilect 1 mg once daily, started thisin July 2015. Sinemet was increased in 5/18 to1 pill4times a day. She has had a fall with injury to the ribs. Typically, she will stand up too quickly and become lightheaded. We talked about the importance of fall prevention.  She is encouraged to try a 2 wheeled walker, they have 1 available through her sister who has several durable medical equipment items from her mother-in-law. She is advised to be proactive about constipation issues.  She is reminded to stand up slowly and turns slowly and be extra careful in the bathroom and kitchen.  She can try the walker in the bathroom and kitchen which may help  her stay reminded that she has to be extra careful in those areas.  She is reminded to stay active physically.  We are going to try to stretch the Sinemet by adding Comtan today.  She is advised to start Comtan 200 mg strength generic at the 11 AM dose and 3 PM dose.  We can increase it to all 4 doses if need be, she is encouraged to give [place] an update in the next 2 to 4 weeks at which time we can decide to maintain or increase the Comtan to all 4 doses.We talked about side effects and expectations with this medication.  She is reminded that Comtan alone is not effective in Parkinson's disease and that it works in conjunction with Sinemet She will continue with the Sinemet at 7, 11, 3 PM and 7 PM and one in the night. She is advised to follow-up  routinely in 3 months, sooner if needed. I answered all their questions today and the patient and her sister were in agreement. I spent 40 minutes in total face-to-face time with the patient, more than 50% of which was spent in counseling and coordination of care, reviewing test results, reviewing medication and discussing or reviewing the diagnosis of PD, its prognosis and treatment options. Pertinent laboratory and imaging test results that were available during this visit with the patient were reviewed by me and considered in my medical decision making (see chart for details).

## 2018-09-06 DIAGNOSIS — G20 Parkinson's disease: Secondary | ICD-10-CM | POA: Diagnosis not present

## 2018-09-06 DIAGNOSIS — H259 Unspecified age-related cataract: Secondary | ICD-10-CM | POA: Diagnosis not present

## 2018-09-06 DIAGNOSIS — R69 Illness, unspecified: Secondary | ICD-10-CM | POA: Diagnosis not present

## 2018-09-06 DIAGNOSIS — H2511 Age-related nuclear cataract, right eye: Secondary | ICD-10-CM | POA: Diagnosis not present

## 2018-09-06 DIAGNOSIS — Z79899 Other long term (current) drug therapy: Secondary | ICD-10-CM | POA: Diagnosis not present

## 2018-09-06 DIAGNOSIS — E039 Hypothyroidism, unspecified: Secondary | ICD-10-CM | POA: Diagnosis not present

## 2018-09-06 DIAGNOSIS — H2513 Age-related nuclear cataract, bilateral: Secondary | ICD-10-CM | POA: Diagnosis not present

## 2018-09-13 DIAGNOSIS — R69 Illness, unspecified: Secondary | ICD-10-CM | POA: Diagnosis not present

## 2018-09-13 DIAGNOSIS — G20 Parkinson's disease: Secondary | ICD-10-CM | POA: Diagnosis not present

## 2018-09-13 DIAGNOSIS — E039 Hypothyroidism, unspecified: Secondary | ICD-10-CM | POA: Diagnosis not present

## 2018-09-13 DIAGNOSIS — E782 Mixed hyperlipidemia: Secondary | ICD-10-CM | POA: Diagnosis not present

## 2018-09-26 DIAGNOSIS — R69 Illness, unspecified: Secondary | ICD-10-CM | POA: Diagnosis not present

## 2018-09-26 DIAGNOSIS — R0602 Shortness of breath: Secondary | ICD-10-CM | POA: Diagnosis not present

## 2018-09-26 DIAGNOSIS — S76012A Strain of muscle, fascia and tendon of left hip, initial encounter: Secondary | ICD-10-CM | POA: Diagnosis not present

## 2018-09-26 DIAGNOSIS — R140 Abdominal distension (gaseous): Secondary | ICD-10-CM | POA: Diagnosis not present

## 2018-09-29 DIAGNOSIS — R0602 Shortness of breath: Secondary | ICD-10-CM | POA: Diagnosis not present

## 2018-09-29 DIAGNOSIS — I340 Nonrheumatic mitral (valve) insufficiency: Secondary | ICD-10-CM | POA: Diagnosis not present

## 2018-10-03 ENCOUNTER — Telehealth: Payer: Self-pay | Admitting: Neurology

## 2018-10-03 DIAGNOSIS — E785 Hyperlipidemia, unspecified: Secondary | ICD-10-CM | POA: Diagnosis not present

## 2018-10-03 DIAGNOSIS — M8580 Other specified disorders of bone density and structure, unspecified site: Secondary | ICD-10-CM | POA: Diagnosis not present

## 2018-10-03 DIAGNOSIS — I951 Orthostatic hypotension: Secondary | ICD-10-CM | POA: Diagnosis not present

## 2018-10-03 DIAGNOSIS — R69 Illness, unspecified: Secondary | ICD-10-CM | POA: Diagnosis not present

## 2018-10-03 DIAGNOSIS — G20 Parkinson's disease: Secondary | ICD-10-CM | POA: Diagnosis not present

## 2018-10-03 DIAGNOSIS — Z9181 History of falling: Secondary | ICD-10-CM | POA: Diagnosis not present

## 2018-10-03 DIAGNOSIS — S76012D Strain of muscle, fascia and tendon of left hip, subsequent encounter: Secondary | ICD-10-CM | POA: Diagnosis not present

## 2018-10-03 NOTE — Telephone Encounter (Signed)
I called pt, discussed Dr. Guadelupe Sabin recommendations. Pt will reduce her comtan dose to just once daily to see if that helps her symptoms. Pt verbalized understanding of recommendations.

## 2018-10-03 NOTE — Telephone Encounter (Signed)
Please call patient back.  If she wants to reduce the Comtan to just once daily along with 1 of her Sinemet doses it is okay, otherwise if she feels more unsteady since starting it, she can also just stop it altogether, she was supposed to be on it twice daily.

## 2018-10-03 NOTE — Telephone Encounter (Signed)
Pt has called to inform that she does not feel steady on her feet while taking entacapone (COMTAN) 200 MG tablet please call

## 2018-10-04 DIAGNOSIS — H40003 Preglaucoma, unspecified, bilateral: Secondary | ICD-10-CM | POA: Diagnosis not present

## 2018-10-05 DIAGNOSIS — H2512 Age-related nuclear cataract, left eye: Secondary | ICD-10-CM | POA: Diagnosis not present

## 2018-10-05 DIAGNOSIS — Z01818 Encounter for other preprocedural examination: Secondary | ICD-10-CM | POA: Diagnosis not present

## 2018-10-11 DIAGNOSIS — E039 Hypothyroidism, unspecified: Secondary | ICD-10-CM | POA: Diagnosis not present

## 2018-10-11 DIAGNOSIS — R69 Illness, unspecified: Secondary | ICD-10-CM | POA: Diagnosis not present

## 2018-10-11 DIAGNOSIS — G20 Parkinson's disease: Secondary | ICD-10-CM | POA: Diagnosis not present

## 2018-10-11 DIAGNOSIS — H2512 Age-related nuclear cataract, left eye: Secondary | ICD-10-CM | POA: Diagnosis not present

## 2018-10-11 DIAGNOSIS — Z79899 Other long term (current) drug therapy: Secondary | ICD-10-CM | POA: Diagnosis not present

## 2018-10-11 DIAGNOSIS — H40003 Preglaucoma, unspecified, bilateral: Secondary | ICD-10-CM | POA: Diagnosis not present

## 2018-10-11 DIAGNOSIS — H259 Unspecified age-related cataract: Secondary | ICD-10-CM | POA: Diagnosis not present

## 2018-10-11 DIAGNOSIS — E7800 Pure hypercholesterolemia, unspecified: Secondary | ICD-10-CM | POA: Diagnosis not present

## 2018-10-18 DIAGNOSIS — S76012D Strain of muscle, fascia and tendon of left hip, subsequent encounter: Secondary | ICD-10-CM | POA: Diagnosis not present

## 2018-10-18 DIAGNOSIS — I951 Orthostatic hypotension: Secondary | ICD-10-CM | POA: Diagnosis not present

## 2018-10-18 DIAGNOSIS — R69 Illness, unspecified: Secondary | ICD-10-CM | POA: Diagnosis not present

## 2018-10-18 DIAGNOSIS — G20 Parkinson's disease: Secondary | ICD-10-CM | POA: Diagnosis not present

## 2018-10-18 DIAGNOSIS — E785 Hyperlipidemia, unspecified: Secondary | ICD-10-CM | POA: Diagnosis not present

## 2018-10-18 DIAGNOSIS — Z9181 History of falling: Secondary | ICD-10-CM | POA: Diagnosis not present

## 2018-10-18 DIAGNOSIS — M8580 Other specified disorders of bone density and structure, unspecified site: Secondary | ICD-10-CM | POA: Diagnosis not present

## 2018-10-20 DIAGNOSIS — G20 Parkinson's disease: Secondary | ICD-10-CM | POA: Diagnosis not present

## 2018-10-20 DIAGNOSIS — M8580 Other specified disorders of bone density and structure, unspecified site: Secondary | ICD-10-CM | POA: Diagnosis not present

## 2018-10-20 DIAGNOSIS — R69 Illness, unspecified: Secondary | ICD-10-CM | POA: Diagnosis not present

## 2018-10-20 DIAGNOSIS — E785 Hyperlipidemia, unspecified: Secondary | ICD-10-CM | POA: Diagnosis not present

## 2018-10-20 DIAGNOSIS — Z9181 History of falling: Secondary | ICD-10-CM | POA: Diagnosis not present

## 2018-10-20 DIAGNOSIS — S76012D Strain of muscle, fascia and tendon of left hip, subsequent encounter: Secondary | ICD-10-CM | POA: Diagnosis not present

## 2018-10-20 DIAGNOSIS — I951 Orthostatic hypotension: Secondary | ICD-10-CM | POA: Diagnosis not present

## 2018-10-26 DIAGNOSIS — S76012D Strain of muscle, fascia and tendon of left hip, subsequent encounter: Secondary | ICD-10-CM | POA: Diagnosis not present

## 2018-10-26 DIAGNOSIS — Z9181 History of falling: Secondary | ICD-10-CM | POA: Diagnosis not present

## 2018-10-26 DIAGNOSIS — G20 Parkinson's disease: Secondary | ICD-10-CM | POA: Diagnosis not present

## 2018-10-26 DIAGNOSIS — I951 Orthostatic hypotension: Secondary | ICD-10-CM | POA: Diagnosis not present

## 2018-10-26 DIAGNOSIS — M8580 Other specified disorders of bone density and structure, unspecified site: Secondary | ICD-10-CM | POA: Diagnosis not present

## 2018-10-26 DIAGNOSIS — E785 Hyperlipidemia, unspecified: Secondary | ICD-10-CM | POA: Diagnosis not present

## 2018-10-26 DIAGNOSIS — R69 Illness, unspecified: Secondary | ICD-10-CM | POA: Diagnosis not present

## 2018-10-31 DIAGNOSIS — R69 Illness, unspecified: Secondary | ICD-10-CM | POA: Diagnosis not present

## 2018-11-29 ENCOUNTER — Other Ambulatory Visit: Payer: Self-pay | Admitting: Neurology

## 2018-11-29 DIAGNOSIS — G20 Parkinson's disease: Secondary | ICD-10-CM

## 2018-12-07 ENCOUNTER — Ambulatory Visit: Payer: Medicare HMO | Admitting: Neurology

## 2018-12-07 ENCOUNTER — Encounter: Payer: Self-pay | Admitting: Neurology

## 2018-12-07 ENCOUNTER — Other Ambulatory Visit: Payer: Self-pay

## 2018-12-07 VITALS — BP 133/85 | HR 79 | Ht 67.0 in | Wt 173.0 lb

## 2018-12-07 DIAGNOSIS — K5909 Other constipation: Secondary | ICD-10-CM

## 2018-12-07 DIAGNOSIS — F439 Reaction to severe stress, unspecified: Secondary | ICD-10-CM | POA: Diagnosis not present

## 2018-12-07 DIAGNOSIS — G20 Parkinson's disease: Secondary | ICD-10-CM

## 2018-12-07 DIAGNOSIS — R69 Illness, unspecified: Secondary | ICD-10-CM | POA: Diagnosis not present

## 2018-12-07 MED ORDER — RASAGILINE MESYLATE 1 MG PO TABS: 1.0000 mg | ORAL_TABLET | Freq: Every day | ORAL | 3 refills | Status: DC

## 2018-12-07 MED ORDER — CARBIDOPA-LEVODOPA 25-100 MG PO TABS: ORAL_TABLET | ORAL | 3 refills | Status: DC

## 2018-12-07 NOTE — Patient Instructions (Addendum)
Your exam is fairly stable but you do have a little more change in your posture and neck stiffness.  Please try to see if you can tolerate a second dose of the Comtan, such as with your 7 PM dose.  Otherwise you can maintain it once daily at 11 AM, continue with your Sinemet 4 times a day and rasagiline once daily as well as Lexapro once daily.  I would like to see you back in 4 months.

## 2018-12-07 NOTE — Progress Notes (Signed)
Subjective:    Patient ID: Tami Vega is a 78 y.o. female.  HPI     Interim history:   Tami Vega is a very pleasant 78 year old right-handed woman with an underlying medical history of hyperlipidemia, history of pituitary microadenoma, precancerous breast lesions status post double mastectomy in the 1980s, who presents for follow-up consultation of her left-sided predominant Parkinson's disease. The patient is accompanied by her sister again today. I last saw her on 09/05/2018, at which time she reported feeling more slower, she had had a few times of feeling lightheaded upon standing up too quickly.  She had a fall in the kitchen.  She was taking Sinemet 4 times a day.  She was more fatigued by the end of the day.  Constipation was under control.  She had had some stable hallucinations, nothing bothersome.  I suggested a cautious addition of Comtan for 2 of her 4 doses of Sinemet.  She called back in July 2020 indicating that she felt more unsteady since starting the Comtan and she was advised to reduce the Comtan to only once daily with 1 of her Sinemet doses.  Today, 12/07/2018: She reports that she is able to tolerate the Comtan once daily and it seems to help in that the Sinemet dose seems to last longer.  She takes it once at 11 AM.  She felt drunk and too sedated when she took 2 pills a day.  She is regular with her scheduled doses of Sinemet.  She has had interim stress what with her daughter likely moving to a different house, she currently lives next door.  Her daughter will be staying close by but still it is a stressor for the patient and her husband.  She also worries about her husband, who is 26 years old and had memory loss and has become more sedentary, more sleepy during the day.  The patient is trying to exercise, she is hoping to be able to go to the gym which is about to reopen.  She tries to hydrate well.  She has had issues with constipation and takes MiraLAX about once a week.   She has had no falls thankfully.  Depression and anxiety are generally under control on her current dose of Lexapro.   The patient's allergies, current medications, family history, past medical history, past social history, past surgical history and problem list were reviewed and updated as appropriate.    Previously (copied from previous notes for reference):    I saw her on 03/02/2018, at which time she reported that she was not able to tolerate the Sinemet long-acting.  She had problems with insomnia, she was no longer on it.  We had started it in or around June 2019.  She was generally taking Sinemet 4 times a day.  She was having more difficulty in the mornings in terms of mobility.  She was advised to take a middle of the night dose of Sinemet for total of 5 pills/day, the middle of the night dose anywhere between 11 PM and 4 AM.  We talked about the importance of monitoring her driving.  She had limited her driving.     I saw her on 08/30/2017, at which time she reported having had one fall in the past 6 months. She fell in the kitchen. She unfortunately cracked a couple of ribs, had an x-ray through PCP. She noticed that she was more stiffer in the mornings and had difficulty moving at times, shuffling more. We talked  about the importance of fall prevention and the importance of being proactive about constipation. She was advised to hydrate well with water and continue with Sinemet 1 pill 4 times a day at 7, 11, 3 and 7. I asked her to add Sinemet CR 50-200 milligrams strength at bedtime. She was advised to continue with Azilect once daily as well.   She called a few days after that in the interim reporting that she felt more sleepy after starting the Sinemet CR. I suggested we could reduce it to 25-100 milligrams strength long-acting but she declined.   I saw her on 02/22/17, at which time she reported doing okay, sometimes she felt lightheaded. She had had a few falls, with a tendency to fall  backwards when she stood up too quickly. For her occasional constipation she was using MiraLAX as needed about twice a week. I advised her to continue her current Parkinson's disease medication regimen including Sinemet 1 pill 4 times a day and Azilect 1 pill once daily, talked about fall risk and gait safety.   I saw her on 08/18/2016, at which time she reported increase in tremor on the left side. She had some difficulty with sleep at times. She had not tried any over-the-counter medication. She had a fall about 3 months prior in the yard. Thankfully she did not sustain any major injuries. She did bruise her right thigh. I suggested she try to increase her Sinemet to 1 pill 4 times a day and keep her Azilect at 1 mg once daily. She was advised to try melatonin for sleep.   I saw her on 02/19/2016, at which time she reported overall doing quite well, she was active, sewing and making crafts. She had no recent falls but had noticed lightheadedness particularly upon standing up quickly. She had a near fall experience recently at a restaurant. She was not always hydrating well. Mood and memory were stable. Motor-wise, she felt stable as well. I suggested we continue with her medication regimen, including Azilect and Sinemet.Marland Kitchen She was furthermore advised to stay better hydrated and change positions slowly, also start using compression stockings.   I saw her on 08/12/2015, at which time she reported feeling fairly stable. She was going to the gym about 4 times a week, had lost weight, mostly because of change in taste/lack of taste. Also, she cut out sodas and has been watching what she eats, trying to drink more water. Overall, motor-wise she felt stable, had mild short-term memory issues but no mood or sinister memory issues, no recent falls. Sometimes her left hand tremor was worse. She has 3 grown children and 3 grandchildren. She was on low-dose Lexapro. I suggested she continue with Azilect once daily and  Sinemet 1 pill 3 times a day.   I saw her on 02/11/2015, at which time she reported doing better with the recent addition of Sinemet. She felt she had more energy and mobility was better as well. She needed refills on paper prescriptions because of changing her pharmacies. Her daughter reported that she had stopped taking her antidepressant for a couple of months but her family had noticed more problems with her depression and anxiety and she restarted her medication. She was trying to stay active. She is trying to drink enough water. She did have additional stressors lately because her son was involved in a car accident and was still in the hospital at the time. Overall, however, she had done fairly well. We mutually agreed to continue  her on Azilect 1 mg once daily and Sinemet 1 pill 3 times a day.   I saw her on 10/16/2014, at which time she reported that her tremor was a little worse. She also felt that her walking was worse. She had a harder time picking up her feet, particularly on the left side. Thankfully she had not fallen. She was sleeping a little better since switching her antidepressant to nighttime. I asked her to continue with Azilect once daily but also to add a small dose of Sinemet starting with half a pill twice daily with gradual titration to 1 pill 3 times a day.    I saw her on 04/17/2014, at which time she reported doing a little better with regards to her tremor. She noticed a flareup of her tremor with nervousness. She was still able to use her sewing machine. She had no side effects from her medications. She did find out that she had 2 female cousins on her father's side with Parkinson's disease. I asked her to continue Azilect 1 mg once daily.   I first met her on 10/20/2013 at the request of her primary care physician, at which time she gave a 6-12 month history of left upper extremity tremor. She also reported problems with fine motor skills on the left. Her history and physical  exam are in keeping with mild parkinsonism, probably left eye predominant Parkinson's disease. I suggested further workup with a brain MRI and starting her on Azilect. She did not have a brain scan done.    She has noted L sided issues with fine motor skills, resting tremor on the LUE, she feels easily fatigued and it is difficult for her to pick up her L leg sometimes. Thankfully, she has not fallen, but her balance is off at times. She goes to [employer] 4 times a week, but has less stamina. She is a life long non-smoker, does not drink alcohol and has no history of head injury, exposure to pesticides, but did work in a Emergency planning/management officer for 33 years and she [place]. She has no FHx of PD or tremors.   Her Past Medical History Is Significant For: Past Medical History:  Diagnosis Date  . Abnormal involuntary movements(781.0)   . Disorder of bone and cartilage, unspecified   . Elevated blood pressure reading without diagnosis of hypertension   . Major depressive disorder, single episode, unspecified   . Other musculoskeletal symptoms referable to limbs(729.89)   . Pain in joint, lower leg   . Pure hypercholesterolemia     Her Past Surgical History Is Significant For: Past Surgical History:  Procedure Laterality Date  . BREAST SURGERY  1980's   benign cyst  . COLONOSCOPY  1995, 1998  . VAGINAL HYSTERECTOMY  1975    Her Family History Is Significant For: Family History  Problem Relation Age of Onset  . CAD Mother   . Breast cancer Mother   . CAD Father   . CAD Brother   . Breast cancer Maternal Aunt   . Colon cancer Maternal Aunt   . Hyperlipidemia Sister   . Hypertension Sister     Her Social History Is Significant For: Social History   Socioeconomic History  . Marital status: Married    Spouse name: Not on file  . Number of children: Not on file  . Years of education: Not on file  . Highest education level: Not on file  Occupational History  . Not on file  Social  Needs  . Financial resource strain: Not on file  . Food insecurity    Worry: Not on file    Inability: Not on file  . Transportation needs    Medical: Not on file    Non-medical: Not on file  Tobacco Use  . Smoking status: Never Smoker  . Smokeless tobacco: Never Used  Substance and Sexual Activity  . Alcohol use: No    Alcohol/week: 0.0 standard drinks  . Drug use: No  . Sexual activity: Not on file  Lifestyle  . Physical activity    Days per week: Not on file    Minutes per session: Not on file  . Stress: Not on file  Relationships  . Social Herbalist on phone: Not on file    Gets together: Not on file    Attends religious service: Not on file    Active member of club or organization: Not on file    Attends meetings of clubs or organizations: Not on file    Relationship status: Not on file  Other Topics Concern  . Not on file  Social History Narrative  . Not on file    Her Allergies Are:  No Known Allergies:   Her Current Medications Are:  Outpatient Encounter Medications as of 12/07/2018  Medication Sig  . Calcium Carbonate-Vit D-Min (CALCIUM 1200 PO) Take by mouth daily. Take one tablet a day  . carbidopa-levodopa (SINEMET IR) 25-100 MG tablet TAKE 1 TABLET BY MOUTH 4 TIMES A DAY AT 7 AM, 11 AM, 3 PM and 7 PM, and one if needed in the middle of the night. AWAY FROM MEALTIMES  . Cholecalciferol (VITAMIN D-3 PO) Take by mouth daily. Take one capsule a day  . cyanocobalamin 1000 MCG tablet Take 100 mcg by mouth daily.  Mariane Baumgarten Calcium (STOOL SOFTENER PO) Take by mouth.  . entacapone (COMTAN) 200 MG tablet Take 1 tablet (200 mg total) by mouth 2 (two) times daily. Take with Sinemet dose at 11 AM and 3 PM. (Patient taking differently: Take 200 mg by mouth daily. )  . escitalopram (LEXAPRO) 10 MG tablet Take 10 mg by mouth daily.  . fish oil-omega-3 fatty acids 1000 MG capsule Take 2 g by mouth daily.  Marland Kitchen levothyroxine (SYNTHROID, LEVOTHROID) 75 MCG tablet  Take 75 mcg by mouth daily.  . Multiple Vitamin (MULTIVITAMIN) capsule Take 1 capsule by mouth daily.  . rasagiline (AZILECT) 1 MG TABS tablet Take 1 tablet (1 mg total) by mouth daily.   No facility-administered encounter medications on file as of 12/07/2018.   :  Review of Systems:  Out of a complete 14 point review of systems, all are reviewed and negative with the exception of these symptoms as listed below: Review of Systems  Neurological:       Pt presents today to discuss her PD. Pt reduced her comtan to just daily because the BID dosing made her feel "drunk". Pt has been dealing with "family issues."    Objective:  Neurological Exam  Physical Exam Physical Examination:   Vitals:   12/07/18 1139  BP: 133/85  Pulse: 79    General Examination: The patient is a very pleasant 78 y.o. female in no acute distress. She appears well-developed and well-nourished and well groomed.   HEENT:Normocephalic, atraumatic, pupils are equal, round and reactive to light. Corrective eyeglasses in place, Bilateral cataracts noted. Extraocular tracking shows mild saccadic breakdown without nystagmus noted. There is limitation to upper  gaze. There is mild decrease in eye blink rate. Hearing is mildly impaired. Face is symmetric with moderatefacial masking and normal facial sensation. There is no lip, neck or jaw tremor. Neck is moderately rigid with mildly decreased passive ROM. Oropharynx exam revealsmildmouth dryness. No significant airway crowding is noted. Mallampati is class II. Tongue protrudes centrally and palate elevates symmetrically. There is no drooling.   Chest:is clear to auscultation without wheezing, rhonchi or crackles noted.  Heart:sounds are regular and normal without murmurs, rubs or gallops noted.   Abdomen:is soft, non-tender and non-distended with normal bowel sounds appreciated on auscultation.  Extremities:There is trace pitting edema in the distal lower  extremities bilaterally.  Skin: is warm and dry with no trophic changes noted. Age-related changes are noted on the skin.   Musculoskeletal: exam reveals no obvious joint deformities, tenderness, joint swelling or erythema.  Neurologically:  Mental status: The patient is awake and alert, paying good attention. She is able to completely provide the history. Hersister provides some details. She is oriented to: person, place, time/date, situation, day of week, month of year and year. Her memory, attention, language and knowledge arepreserved to very mildly impaired. There is no aphasia, agnosia, apraxia or anomia. There is a very mild degree of bradyphrenia. Speech is mildly hypophonic with no dysarthria noted. Mood is congruent and affect is normal. Cranial nerves are as described above under HEENT exam. In addition, shoulder shrug is normal but left shoulder higher than R, more Noticeable today than before.  Motor exam: Normal bulk, and strength for age is noted. There are Very mild generalized dyskinesias today, intermittent, mostly LEs.   Tone is mildly rigid with presence of cogwheeling in the left upper extremity. There is overall moderate bradykinesia. There is no drift or rebound.  There is a mildresting tremor in the left upper extremity and no other resting tremor. Romberg isnot tested d/t safetyconcern.Fine motor skills exam:mildly impaired on the right and more moderately impaired on the left overall.  Cerebellar testing shows no dysmetria or intention tremor on finger to nose testing. there is no truncal or gait ataxia.   Sensory exam is intact to light touch in the upper and lower extremities.   Gait, station and balance: She stands up from the seated position with mild difficulty and does need to push up with Her hands. She needs no assistance. No veering to one side is noted. She is not noted to lean today. Posture is moderately stooped. Stance is narrow-based. She walks  with decrease in stride length and pace and decreased arm swing on the left andmild increase in tremor of the L hand.She turns in 3 steps. Balance is overallfairlypreserved.   Assessment and Plan:   In summary, Tami Vega is a very pleasant 78 year old female with an underlying medical history of hyperlipidemia, history of pituitary microadenoma, precancerous breast lesions(withs/pdouble mastectomy in the 1980s), who presents for follow-up consultation of her left-sided predominant Parkinson's disease with symptoms dating back toabout5-6years ago,with thankfully fairly stable findings for quite some time,and good results with Sinemetoverall.She has had progression over time and has had falls. She has mild dyskinesias, not bothersome to her, mild intermittent visual hallucinations, some constipation, no significant memory impairment.She started C/Lin July 2016. She is also on Azilect 1 mg once daily, started thisin July 2015. Sinemet was increased in 5/18 to1 pill4times a day. She has had a fall with injury to the ribs. Typically, she will stand up too quickly and become lightheaded.We talked about the  importance of fall prevention again. She is encouraged to be proactive about constipation issues.  She is reminded to stand up slowly and turns slowly and be extra careful in the bathroom and kitchen. She is reminded to stay active mentally and physically.  She continues to use her sewing machine successfully and is currently making teddy bears. She makes them for friends and family, particularly people who have lost a loved 1.She is currently under stress.  Nevertheless, she denies any significant exacerbation of her depression and anxiety and good results with her Lexapro so far.We started Comtan up to twice daily back in June 2020.  She felt drugged on the second dose, has had reasonably good results with it once daily with her 11 AM Sinemet dose.  She is encouraged to try it again for  her 7 PM dose and see if it benefits her for every other dose at this time with her Sinemet.She found that the Sinemet seems to last her longer when she takes it with her 11 AM dose.  She is willing to try it.  She will keep [place] posted.  We plan to reevaluate things in 4 months, sooner if needed. She will continue with the Sinemet at 7, 11, 3 PM and 7 PM and one in the night prn. She We will also continue with generic Azilect 1 mg once daily. I answered all their questions today and the patient and her sister were in agreement. I spent 30 minutes in total face-to-face time with the patient, more than 50% of which was spent in counseling and coordination of care, reviewing test results, reviewing medication and discussing or reviewing the diagnosis of PD, its prognosis and treatment options. Pertinent laboratory and imaging test results that were available during this visit with the patient were reviewed by me and considered in my medical decision making (see chart for details).

## 2018-12-12 DIAGNOSIS — M545 Low back pain: Secondary | ICD-10-CM | POA: Diagnosis not present

## 2018-12-12 DIAGNOSIS — R8290 Unspecified abnormal findings in urine: Secondary | ICD-10-CM | POA: Diagnosis not present

## 2018-12-19 DIAGNOSIS — N390 Urinary tract infection, site not specified: Secondary | ICD-10-CM | POA: Diagnosis not present

## 2018-12-19 DIAGNOSIS — G20 Parkinson's disease: Secondary | ICD-10-CM | POA: Diagnosis not present

## 2018-12-19 DIAGNOSIS — Z23 Encounter for immunization: Secondary | ICD-10-CM | POA: Diagnosis not present

## 2018-12-19 DIAGNOSIS — R69 Illness, unspecified: Secondary | ICD-10-CM | POA: Diagnosis not present

## 2018-12-19 DIAGNOSIS — E039 Hypothyroidism, unspecified: Secondary | ICD-10-CM | POA: Diagnosis not present

## 2018-12-27 ENCOUNTER — Telehealth: Payer: Self-pay | Admitting: Neurology

## 2018-12-27 NOTE — Telephone Encounter (Signed)
Pt called in and stated she is doing well with Carbidopa-Levodopa

## 2018-12-27 NOTE — Telephone Encounter (Signed)
**  UPDATE** She meant entacapone (COMTAN) 200 MG tablet

## 2019-03-06 DIAGNOSIS — G20 Parkinson's disease: Secondary | ICD-10-CM | POA: Diagnosis not present

## 2019-03-06 DIAGNOSIS — K5900 Constipation, unspecified: Secondary | ICD-10-CM | POA: Diagnosis not present

## 2019-03-06 DIAGNOSIS — Z809 Family history of malignant neoplasm, unspecified: Secondary | ICD-10-CM | POA: Diagnosis not present

## 2019-03-06 DIAGNOSIS — K219 Gastro-esophageal reflux disease without esophagitis: Secondary | ICD-10-CM | POA: Diagnosis not present

## 2019-03-06 DIAGNOSIS — R69 Illness, unspecified: Secondary | ICD-10-CM | POA: Diagnosis not present

## 2019-03-06 DIAGNOSIS — Z803 Family history of malignant neoplasm of breast: Secondary | ICD-10-CM | POA: Diagnosis not present

## 2019-03-06 DIAGNOSIS — R32 Unspecified urinary incontinence: Secondary | ICD-10-CM | POA: Diagnosis not present

## 2019-03-06 DIAGNOSIS — Z596 Low income: Secondary | ICD-10-CM | POA: Diagnosis not present

## 2019-03-06 DIAGNOSIS — E039 Hypothyroidism, unspecified: Secondary | ICD-10-CM | POA: Diagnosis not present

## 2019-03-20 DIAGNOSIS — E559 Vitamin D deficiency, unspecified: Secondary | ICD-10-CM | POA: Diagnosis not present

## 2019-03-20 DIAGNOSIS — G20 Parkinson's disease: Secondary | ICD-10-CM | POA: Diagnosis not present

## 2019-03-20 DIAGNOSIS — E039 Hypothyroidism, unspecified: Secondary | ICD-10-CM | POA: Diagnosis not present

## 2019-03-20 DIAGNOSIS — R69 Illness, unspecified: Secondary | ICD-10-CM | POA: Diagnosis not present

## 2019-04-12 ENCOUNTER — Ambulatory Visit: Payer: Medicare HMO | Admitting: Neurology

## 2019-04-12 ENCOUNTER — Other Ambulatory Visit: Payer: Self-pay

## 2019-04-12 ENCOUNTER — Encounter: Payer: Self-pay | Admitting: Neurology

## 2019-04-12 VITALS — BP 123/67 | HR 77 | Temp 97.0°F | Ht 68.0 in | Wt 173.0 lb

## 2019-04-12 DIAGNOSIS — K5909 Other constipation: Secondary | ICD-10-CM | POA: Diagnosis not present

## 2019-04-12 DIAGNOSIS — G20 Parkinson's disease: Secondary | ICD-10-CM | POA: Diagnosis not present

## 2019-04-12 MED ORDER — ENTACAPONE 200 MG PO TABS: 200.0000 mg | ORAL_TABLET | Freq: Three times a day (TID) | ORAL | 3 refills | Status: DC

## 2019-04-12 NOTE — Progress Notes (Signed)
Subjective:    Patient ID: Tami Vega is a 79 y.o. female.  HPI     Interim history:   Tami Vega is a very pleasant 79 year old right-handed woman with an underlying medical history of hyperlipidemia, history of pituitary microadenoma, precancerous breast lesions status post double mastectomy in the 1980s, who presents for follow-up consultation of her left-sided predominant Parkinson's disease. The patient is accompanied by her sister again today. I last saw her on 12/07/2018, at which time she reported taking Comtan only once daily at 11 AM.  She felt that she was too sedated when she took it twice daily but she was willing to try it again.  I suggest that she added for the 3 PM dose.  We kept her Sinemet the same as well as the Azilect.   Today, 04/12/2019: She reports that she has been tolerating the Comtan twice daily and has no obvious side effects.  She is wondering if she could add a third dose in the morning with her 7 AM Sinemet dose.  Thankfully, she has not had any falls but she has had some interim balance issues.  She admits that she does not always hydrate well with water.  Constipation is an ongoing issue but currently under reasonable control with MiraLAX every other day.  She has had some interim stress and flareup and anxiety.  She discussed with her primary care physician the possibility of increasing the Lexapro but she has been reluctant and continues to take 10 mg once daily.  Appetite is also not as good as before.  She cooks in limitation at home and her son brings food once a week and her daughter brings food once a week. .     The patient's allergies, current medications, family history, past medical history, past social history, past surgical history and problem list were reviewed and updated as appropriate.    Previously (copied from previous notes for reference):        I saw her on 09/05/2018, at which time she reported feeling more slower, she had had a few  times of feeling lightheaded upon standing up too quickly.  She had a fall in the kitchen.  She was taking Sinemet 4 times a day.  She was more fatigued by the end of the day.  Constipation was under control.  She had had some stable hallucinations, nothing bothersome.  I suggested a cautious addition of Comtan for 2 of her 4 doses of Sinemet.  She called back in July 2020 indicating that she felt more unsteady since starting the Comtan and she was advised to reduce the Comtan to only once daily with 1 of her Sinemet doses.   I saw her on 03/02/2018, at which time she reported that she was not able to tolerate the Sinemet long-acting.  She had problems with insomnia, she was no longer on it.  We had started it in or around June 2019.  She was generally taking Sinemet 4 times a day.  She was having more difficulty in the mornings in terms of mobility.  She was advised to take a middle of the night dose of Sinemet for total of 5 pills/day, the middle of the night dose anywhere between 11 PM and 4 AM.  We talked about the importance of monitoring her driving.  She had limited her driving.     I saw her on 08/30/2017, at which time she reported having had one fall in the past 6 months. She fell  in the kitchen. She unfortunately cracked a couple of ribs, had an x-ray through PCP. She noticed that she was more stiffer in the mornings and had difficulty moving at times, shuffling more. We talked about the importance of fall prevention and the importance of being proactive about constipation. She was advised to hydrate well with water and continue with Sinemet 1 pill 4 times a day at 7, 11, 3 and 7. I asked her to add Sinemet CR 50-200 milligrams strength at bedtime. She was advised to continue with Azilect once daily as well.   She called a few days after that in the interim reporting that she felt more sleepy after starting the Sinemet CR. I suggested we could reduce it to 25-100 milligrams strength long-acting but  she declined.   I saw her on 02/22/17, at which time she reported doing okay, sometimes she felt lightheaded. She had had a few falls, with a tendency to fall backwards when she stood up too quickly. For her occasional constipation she was using MiraLAX as needed about twice a week. I advised her to continue her current Parkinson's disease medication regimen including Sinemet 1 pill 4 times a day and Azilect 1 pill once daily, talked about fall risk and gait safety.   I saw her on 08/18/2016, at which time she reported increase in tremor on the left side. She had some difficulty with sleep at times. She had not tried any over-the-counter medication. She had a fall about 3 months prior in the yard. Thankfully she did not sustain any major injuries. She did bruise her right thigh. I suggested she try to increase her Sinemet to 1 pill 4 times a day and keep her Azilect at 1 mg once daily. She was advised to try melatonin for sleep.   I saw her on 02/19/2016, at which time she reported overall doing quite well, she was active, sewing and making crafts. She had no recent falls but had noticed lightheadedness particularly upon standing up quickly. She had a near fall experience recently at a restaurant. She was not always hydrating well. Mood and memory were stable. Motor-wise, she felt stable as well. I suggested we continue with her medication regimen, including Azilect and Sinemet.Marland Kitchen She was furthermore advised to stay better hydrated and change positions slowly, also start using compression stockings.   I saw her on 08/12/2015, at which time she reported feeling fairly stable. She was going to the gym about 4 times a week, had lost weight, mostly because of change in taste/lack of taste. Also, she cut out sodas and has been watching what she eats, trying to drink more water. Overall, motor-wise she felt stable, had mild short-term memory issues but no mood or sinister memory issues, no recent falls. Sometimes  her left hand tremor was worse. She has 3 grown children and 3 grandchildren. She was on low-dose Lexapro. I suggested she continue with Azilect once daily and Sinemet 1 pill 3 times a day.   I saw her on 02/11/2015, at which time she reported doing better with the recent addition of Sinemet. She felt she had more energy and mobility was better as well. She needed refills on paper prescriptions because of changing her pharmacies. Her daughter reported that she had stopped taking her antidepressant for a couple of months but her family had noticed more problems with her depression and anxiety and she restarted her medication. She was trying to stay active. She is trying to drink enough water. She  did have additional stressors lately because her son was involved in a car accident and was still in the hospital at the time. Overall, however, she had done fairly well. We mutually agreed to continue her on Azilect 1 mg once daily and Sinemet 1 pill 3 times a day.   I saw her on 10/16/2014, at which time she reported that her tremor was a little worse. She also felt that her walking was worse. She had a harder time picking up her feet, particularly on the left side. Thankfully she had not fallen. She was sleeping a little better since switching her antidepressant to nighttime. I asked her to continue with Azilect once daily but also to add a small dose of Sinemet starting with half a pill twice daily with gradual titration to 1 pill 3 times a day.    I saw her on 04/17/2014, at which time she reported doing a little better with regards to her tremor. She noticed a flareup of her tremor with nervousness. She was still able to use her sewing machine. She had no side effects from her medications. She did find out that she had 2 female cousins on her father's side with Parkinson's disease. I asked her to continue Azilect 1 mg once daily.   I first met her on 10/20/2013 at the request of her primary care physician, at  which time she gave a 6-12 month history of left upper extremity tremor. She also reported problems with fine motor skills on the left. Her history and physical exam are in keeping with mild parkinsonism, probably left eye predominant Parkinson's disease. I suggested further workup with a brain MRI and starting her on Azilect. She did not have a brain scan done.    She has noted L sided issues with fine motor skills, resting tremor on the LUE, she feels easily fatigued and it is difficult for her to pick up her L leg sometimes. Thankfully, she has not fallen, but her balance is off at times. She goes to [employer] 4 times a week, but has less stamina. She is a life long non-smoker, does not drink alcohol and has no history of head injury, exposure to pesticides, but did work in a Emergency planning/management officer for 33 years and she [place]. She has no FHx of PD or tremors.   Her Past Medical History Is Significant For: Past Medical History:  Diagnosis Date  . Abnormal involuntary movements(781.0)   . Disorder of bone and cartilage, unspecified   . Elevated blood pressure reading without diagnosis of hypertension   . Major depressive disorder, single episode, unspecified   . Other musculoskeletal symptoms referable to limbs(729.89)   . Pain in joint, lower leg   . Pure hypercholesterolemia     Her Past Surgical History Is Significant For: Past Surgical History:  Procedure Laterality Date  . BREAST SURGERY  1980's   benign cyst  . COLONOSCOPY  1995, 1998  . VAGINAL HYSTERECTOMY  1975    Her Family History Is Significant For: Family History  Problem Relation Age of Onset  . CAD Mother   . Breast cancer Mother   . CAD Father   . CAD Brother   . Breast cancer Maternal Aunt   . Colon cancer Maternal Aunt   . Hyperlipidemia Sister   . Hypertension Sister     Her Social History Is Significant For: Social History   Socioeconomic History  . Marital status: Married    Spouse name:  Not on file  .  Number of children: Not on file  . Years of education: Not on file  . Highest education level: Not on file  Occupational History  . Not on file  Tobacco Use  . Smoking status: Never Smoker  . Smokeless tobacco: Never Used  Substance and Sexual Activity  . Alcohol use: No    Alcohol/week: 0.0 standard drinks  . Drug use: No  . Sexual activity: Not on file  Other Topics Concern  . Not on file  Social History Narrative  . Not on file   Social Determinants of Health   Financial Resource Strain:   . Difficulty of Paying Living Expenses: Not on file  Food Insecurity:   . Worried About Charity fundraiser in the Last Year: Not on file  . Ran Out of Food in the Last Year: Not on file  Transportation Needs:   . Lack of Transportation (Medical): Not on file  . Lack of Transportation (Non-Medical): Not on file  Physical Activity:   . Days of Exercise per Week: Not on file  . Minutes of Exercise per Session: Not on file  Stress:   . Feeling of Stress : Not on file  Social Connections:   . Frequency of Communication with Friends and Family: Not on file  . Frequency of Social Gatherings with Friends and Family: Not on file  . Attends Religious Services: Not on file  . Active Member of Clubs or Organizations: Not on file  . Attends Archivist Meetings: Not on file  . Marital Status: Not on file    Her Allergies Are:  No Known Allergies:   Her Current Medications Are:  Outpatient Encounter Medications as of 04/12/2019  Medication Sig  . Calcium Carbonate-Vit D-Min (CALCIUM 1200 PO) Take by mouth daily. Take one tablet a day  . carbidopa-levodopa (SINEMET IR) 25-100 MG tablet TAKE 1 TABLET BY MOUTH 4 TIMES A DAY AT 7 AM, 11 AM, 3 PM and 7 PM, and one if needed in the middle of the night. AWAY FROM MEALTIMES  . Cholecalciferol (VITAMIN D-3 PO) Take by mouth daily. Take one capsule a day  . cyanocobalamin 1000 MCG tablet Take 100 mcg by mouth daily.  Mariane Baumgarten Calcium  (STOOL SOFTENER PO) Take by mouth.  . entacapone (COMTAN) 200 MG tablet Take 1 tablet (200 mg total) by mouth 3 (three) times daily. Take with Sinemet dose at 7 AM, 11 AM and 3 PM.  . escitalopram (LEXAPRO) 10 MG tablet Take 10 mg by mouth daily.  . fish oil-omega-3 fatty acids 1000 MG capsule Take 2 g by mouth daily.  Marland Kitchen levothyroxine (SYNTHROID, LEVOTHROID) 75 MCG tablet Take 75 mcg by mouth daily.  . Multiple Vitamin (MULTIVITAMIN) capsule Take 1 capsule by mouth daily.  . rasagiline (AZILECT) 1 MG TABS tablet Take 1 tablet (1 mg total) by mouth daily.  . [DISCONTINUED] entacapone (COMTAN) 200 MG tablet Take 1 tablet (200 mg total) by mouth 2 (two) times daily. Take with Sinemet dose at 11 AM and 3 PM. (Patient taking differently: Take 200 mg by mouth daily. )   No facility-administered encounter medications on file as of 04/12/2019.  :  Review of Systems:  Out of a complete 14 point review of systems, all are reviewed and negative with the exception of these symptoms as listed below: Review of Systems  Neurological:       Reports tremors have increased along with shortness of breath on  exertion. Pt would like to discuss adding a morning dosage of comtan if possible. She sts the mornings are hard for her. She and her family also have researched inbrija and would like to discuss starting.     Objective:  Neurological Exam  Physical Exam Physical Examination:   Vitals:   04/12/19 1014  BP: 123/67  Pulse: 77  Temp: (!) 97 F (36.1 C)    General Examination: The patient is a very pleasant 79 y.o. female in no acute distress. She appears well-developed and well-nourished and well groomed.   HEENT:Normocephalic, atraumatic, pupils are equal, round and reactive to light. Corrective eyeglasses in place,bilateral cataracts noted.Extraocular tracking shows mild saccadic breakdown without nystagmus noted. There is limitation to upper gaze. There is mild decrease in eye blink rate. Hearing  is mildly impaired. Face is symmetric withmoderatefacial masking and normal facial sensation. There is no lip, neck or jaw tremor. Neck is moderately rigid with mildly decreased passive ROM. Oropharynx exam revealsmildmouth dryness. No significant airway crowding is noted. Mallampati is class II. Tongue protrudes centrally and palate elevates symmetrically. There is no drooling.   Chest:is clear to auscultation without wheezing, rhonchi or crackles noted.  Heart:sounds are regular and normal without murmurs, rubs or gallops noted.   Abdomen:is soft, non-tender and non-distended with normal bowel sounds appreciated on auscultation.  Extremities:There is no pitting edema in the distal lower extremities bilaterally.  Skin: is warm and dry with no trophic changes noted. Age-related changes are noted on the skin. Spider veins distal legs.  Musculoskeletal: exam reveals no obvious joint deformities, tenderness, joint swelling or erythema.  Neurologically:  Mental status: The patient is awake and alert, paying good attention. She is able to completely provide the history. Hersisterprovides some details. She is oriented to: person, place, time/date, situation, day of week, month of year and year. Her memory, attention, language and knowledge arepreservedto very mildly impaired. There is no aphasia, agnosia, apraxia or anomia. There is a very mild degree of bradyphrenia. Speech is mild to moderately hypophonic with no dysarthria noted. Mood is congruent and affect is normal. Cranial nerves are as described above under HEENT exam. In addition, shoulder shrug is normal but left shoulder higher than R, more Noticeable today than before.  Motor exam: Normal bulk, and strength for age is noted. There aremild generalized dyskinesias today. Tone is mildly rigid with presence of cogwheeling in the left upper extremity. There is overall moderatebradykinesia. There is no drift or rebound.   There is a mildresting tremor in the left upper extremity and no other resting tremor. Romberg isnot tested d/t safetyconcern.Fine motor skills exam:mildly impaired on the right andmoremoderately impaired on the left overall.  Cerebellar testing shows no dysmetria or intention tremor on finger to nose testing.there is no truncal or gait ataxia.   Sensory exam is intact to light touch in the upper and lower extremities.   Gait, station and balance: She stands up from the seated position with mild difficulty and does need to push up with Her hands. She needs no assistance. No veering to one side is noted. She is not noted to lean today. Posture is moderately stooped. Stance is narrow-based. She walks with decrease in stride length and pace and decreased arm swing on the left andmild increase in tremor of the L hand.She turns in 3 steps. Balance is overallfairlypreserved.   Assessment and Plan:   In summary, Tami Vega is a very pleasant 79 year old female with an underlying medical history of  hyperlipidemia, history of pituitary microadenoma, precancerous breast lesions(withs/pdouble mastectomy in the 1980s), who presents for follow-up consultation of her left-sided predominant Parkinson's disease with symptoms dating back toabout5-6years ago,with thankfully fairly stable findings forquitesome time,and good results with Sinemetoverall.She has had progression over time and has had falls. She has mild dyskinesias, not bothersome or very apparent to her,mild intermittent visual hallucinations,someconstipation, no significant memory impairment.She started C/Lin July 2016. She is also on Azilect 1 mg once daily, started thisin July 2015. Sinemet was increased in 5/18 to1 pill4times a day. She has hada fall with injury to the ribs.Typically, she will stand up too quickly and become lightheaded.We talked about the importance of fall prevention and being proactive about  constipation issues. She is reminded to drink more water. For depression and anxiety she has been maintained on Lexapro 10 mg daily, she is reluctant to increase it.  She has had some intermittent stress and flareup in anxiety and depression but would like to keep the current prescription the same.  We mutually agreed to increase her Comtan from 1 pill twice daily to 1 pill 3 times daily at this time with her 7, 11 and 3 PM Sinemet dose.  I explained the inhaled Sinemet medication to her as her son had inquired about it.  We can use this as a rescue medication for sudden off times down the road if needed. We started Comtan in June 2020. I renewed her prescriptions today and increase the Comtan to 3 times daily.  She is wondering if I could take over the Lexapro prescription in the future, I would be happy to.  For now, she has enough of a supply.  I suggested a 39-monthfollow-up, sooner if needed.  I answered all their questions today and the patient and her sister were in agreement.

## 2019-04-12 NOTE — Patient Instructions (Signed)
Your exam is stable, as discussed, we will increase your Comtan for better effect in the morning to 1 pill 3 times a day, at 7 AM, 11 AM and 3 PM.  Please continue with generic Azilect once daily.  Please continue with your Sinemet as prescribed, your prescriptions are up-to-date for Sinemet and Azilect and I increased your prescription for Comtan.  I would be happy to take over your prescription for Lexapro if need be. Since you are son had a question about Inbrija: we can consider this new medication down the road: It is an inhaled form of levodopa for as needed use for sudden off times.  You can use it in between your Sinemet. Side effects include low blood pressure, a cough with the inhalation. It is contraindicated in asthma and COPD.

## 2019-05-08 DIAGNOSIS — N390 Urinary tract infection, site not specified: Secondary | ICD-10-CM | POA: Diagnosis not present

## 2019-05-08 DIAGNOSIS — R300 Dysuria: Secondary | ICD-10-CM | POA: Diagnosis not present

## 2019-05-09 DIAGNOSIS — N390 Urinary tract infection, site not specified: Secondary | ICD-10-CM | POA: Diagnosis not present

## 2019-05-09 DIAGNOSIS — R531 Weakness: Secondary | ICD-10-CM | POA: Diagnosis not present

## 2019-06-05 DIAGNOSIS — N390 Urinary tract infection, site not specified: Secondary | ICD-10-CM | POA: Diagnosis not present

## 2019-06-05 DIAGNOSIS — R197 Diarrhea, unspecified: Secondary | ICD-10-CM | POA: Diagnosis not present

## 2019-06-05 DIAGNOSIS — K5900 Constipation, unspecified: Secondary | ICD-10-CM | POA: Diagnosis not present

## 2019-06-08 ENCOUNTER — Telehealth: Payer: Self-pay | Admitting: Neurology

## 2019-06-08 NOTE — Telephone Encounter (Signed)
Please call patient and advise her that from my end of things there is no contraindication in her getting the Covid vaccine.

## 2019-06-08 NOTE — Telephone Encounter (Signed)
I called pt and gave her Dr.Athar recommendations of the COVID 19 vaccine.PT verbalized understanding.

## 2019-06-08 NOTE — Telephone Encounter (Signed)
Patient called wanting to know if it is recommended she get the covid vaccine

## 2019-06-13 DIAGNOSIS — K5900 Constipation, unspecified: Secondary | ICD-10-CM | POA: Diagnosis not present

## 2019-06-13 DIAGNOSIS — R109 Unspecified abdominal pain: Secondary | ICD-10-CM | POA: Diagnosis not present

## 2019-06-13 DIAGNOSIS — R140 Abdominal distension (gaseous): Secondary | ICD-10-CM | POA: Diagnosis not present

## 2019-06-19 DIAGNOSIS — G20 Parkinson's disease: Secondary | ICD-10-CM | POA: Diagnosis not present

## 2019-06-19 DIAGNOSIS — M8580 Other specified disorders of bone density and structure, unspecified site: Secondary | ICD-10-CM | POA: Diagnosis not present

## 2019-06-19 DIAGNOSIS — E039 Hypothyroidism, unspecified: Secondary | ICD-10-CM | POA: Diagnosis not present

## 2019-06-19 DIAGNOSIS — Z1389 Encounter for screening for other disorder: Secondary | ICD-10-CM | POA: Diagnosis not present

## 2019-06-19 DIAGNOSIS — E782 Mixed hyperlipidemia: Secondary | ICD-10-CM | POA: Diagnosis not present

## 2019-06-19 DIAGNOSIS — Z0000 Encounter for general adult medical examination without abnormal findings: Secondary | ICD-10-CM | POA: Diagnosis not present

## 2019-06-23 DIAGNOSIS — M85852 Other specified disorders of bone density and structure, left thigh: Secondary | ICD-10-CM | POA: Diagnosis not present

## 2019-06-23 DIAGNOSIS — Z780 Asymptomatic menopausal state: Secondary | ICD-10-CM | POA: Diagnosis not present

## 2019-06-23 DIAGNOSIS — M810 Age-related osteoporosis without current pathological fracture: Secondary | ICD-10-CM | POA: Diagnosis not present

## 2019-06-23 DIAGNOSIS — M818 Other osteoporosis without current pathological fracture: Secondary | ICD-10-CM | POA: Diagnosis not present

## 2019-07-25 DIAGNOSIS — B351 Tinea unguium: Secondary | ICD-10-CM | POA: Insufficient documentation

## 2019-09-05 ENCOUNTER — Telehealth: Payer: Self-pay

## 2019-09-05 NOTE — Telephone Encounter (Signed)
Pt called in to report she mixed up her medication dosage time of the carbidopa levodopa 25-100 mg. She sts she took her 3 pm dosage as recommended but took last pill of the day at 4 pm. The 7 and 11 am dosage were taken on schedule ( she currently rx'd to tke 1 pill 4 times per day).  Pt was wondering if she should still take her 7 pm dosage? I advised the pt to hold her 7 pm dosage and re start tomorrow at 7 am.   She verbalized understanding and appreciation for the call.

## 2019-09-05 NOTE — Telephone Encounter (Signed)
Agree with plan. --VRP 

## 2019-09-19 DIAGNOSIS — R69 Illness, unspecified: Secondary | ICD-10-CM | POA: Diagnosis not present

## 2019-09-19 DIAGNOSIS — M810 Age-related osteoporosis without current pathological fracture: Secondary | ICD-10-CM | POA: Diagnosis not present

## 2019-09-19 DIAGNOSIS — G20 Parkinson's disease: Secondary | ICD-10-CM | POA: Diagnosis not present

## 2019-09-19 DIAGNOSIS — E039 Hypothyroidism, unspecified: Secondary | ICD-10-CM | POA: Diagnosis not present

## 2019-10-02 DIAGNOSIS — R531 Weakness: Secondary | ICD-10-CM | POA: Diagnosis not present

## 2019-10-10 ENCOUNTER — Ambulatory Visit: Payer: Medicare HMO | Admitting: Neurology

## 2019-10-10 ENCOUNTER — Other Ambulatory Visit: Payer: Self-pay

## 2019-10-10 ENCOUNTER — Encounter: Payer: Self-pay | Admitting: Neurology

## 2019-10-10 ENCOUNTER — Telehealth: Payer: Self-pay

## 2019-10-10 VITALS — BP 114/80 | HR 77 | Ht 67.5 in | Wt 167.3 lb

## 2019-10-10 DIAGNOSIS — G20 Parkinson's disease: Secondary | ICD-10-CM

## 2019-10-10 DIAGNOSIS — R269 Unspecified abnormalities of gait and mobility: Secondary | ICD-10-CM | POA: Diagnosis not present

## 2019-10-10 DIAGNOSIS — R293 Abnormal posture: Secondary | ICD-10-CM

## 2019-10-10 DIAGNOSIS — R69 Illness, unspecified: Secondary | ICD-10-CM | POA: Diagnosis not present

## 2019-10-10 DIAGNOSIS — F439 Reaction to severe stress, unspecified: Secondary | ICD-10-CM

## 2019-10-10 MED ORDER — ENTACAPONE 200 MG PO TABS: 200.0000 mg | ORAL_TABLET | Freq: Three times a day (TID) | ORAL | 3 refills | Status: DC

## 2019-10-10 MED ORDER — CARBIDOPA-LEVODOPA 25-100 MG PO TABS: 1.0000 | ORAL_TABLET | Freq: Every day | ORAL | 3 refills | Status: DC

## 2019-10-10 NOTE — Patient Instructions (Signed)
As discussed, we will try to get you plugged in with home health physical therapy.  We will change her Sinemet to 1 pill 5 times a day at 7, 10 AM, 1 PM, 4 PM and 7 PM daily.  Continue with Comtan generic 1 pill 3 times a day at 7 AM, 1 PM and 7 PM.  Please follow-up in about 3 to 4 months, call us or email [place] with any interim questions or concerns.  Hang in there!

## 2019-10-10 NOTE — Progress Notes (Signed)
Subjective:    Patient ID: Tami Vega is a 79 y.o. female.  HPI     Interim history:   Tami Vega is a very pleasant 79 year old right-handed woman with an underlying medical history of hyperlipidemia, history of pituitary microadenoma, precancerous breast lesions status post double mastectomy in the 1980s, who presents for follow-up consultation of her left-sided predominant Parkinson's disease. The patient is accompanied by her sister again today. I last saw her on 04/12/2019, at which time she reported tolerating Comtan.  We mutually agreed to increase it to 1 pill 3 times daily.  She was advised to continue with Sinemet and Azilect and we talked about potentially utilizing inbrija potentially down the road.  Today, 10/10/2019: She reports that her Sinemet does not last as long.  It does not last for hours in between doses.  She takes 1 pill 4 times a day and often an additional pill at night when she wakes up around 11 PM.  She is not sure if her nighttime dose is actually helping.  She is able to continue with Comtan 1 pill 3 times daily.  She has had much more stress what with her husband's declining health and memory function.  Sister reports that patient's husband has been diagnosed or is being evaluated for dementia.  There have been issues with friction at home, he has been quite frustrated at times and tends to get irritated with her.  The children have been more involved in her and her husband's care and have taken over his medication administration.  Her other sister also checks on her at least once a week.   The patient's allergies, current medications, family history, past medical history, past social history, past surgical history and problem list were reviewed and updated as appropriate.    Previously (copied from previous notes for reference):      I saw her on 12/07/2018, at which time she reported taking Comtan only once daily at 11 AM.  She felt that she was too sedated when  she took it twice daily but she was willing to try it again.  I suggest that she added for the 3 PM dose.  We kept her Sinemet the same as well as the Azilect.          I saw her on 09/05/2018, at which time she reported feeling more slower, she had had a few times of feeling lightheaded upon standing up too quickly.  She had a fall in the kitchen.  She was taking Sinemet 4 times a day.  She was more fatigued by the end of the day.  Constipation was under control.  She had had some stable hallucinations, nothing bothersome.  I suggested a cautious addition of Comtan for 2 of her 4 doses of Sinemet.  She called back in July 2020 indicating that she felt more unsteady since starting the Comtan and she was advised to reduce the Comtan to only once daily with 1 of her Sinemet doses.   I saw her on 03/02/2018, at which time she reported that she was not able to tolerate the Sinemet long-acting.  She had problems with insomnia, she was no longer on it.  We had started it in or around June 2019.  She was generally taking Sinemet 4 times a day.  She was having more difficulty in the mornings in terms of mobility.  She was advised to take a middle of the night dose of Sinemet for total of 5 pills/day, the  middle of the night dose anywhere between 11 PM and 4 AM.  We talked about the importance of monitoring her driving.  She had limited her driving.     I saw her on 08/30/2017, at which time she reported having had one fall in the past 6 months. She fell in the kitchen. She unfortunately cracked a couple of ribs, had an x-ray through PCP. She noticed that she was more stiffer in the mornings and had difficulty moving at times, shuffling more. We talked about the importance of fall prevention and the importance of being proactive about constipation. She was advised to hydrate well with water and continue with Sinemet 1 pill 4 times a day at 7, 11, 3 and 7. I asked her to add Sinemet CR 50-200 milligrams strength at  bedtime. She was advised to continue with Azilect once daily as well.   She called a few days after that in the interim reporting that she felt more sleepy after starting the Sinemet CR. I suggested we could reduce it to 25-100 milligrams strength long-acting but she declined.   I saw her on 02/22/17, at which time she reported doing okay, sometimes she felt lightheaded. She had had a few falls, with a tendency to fall backwards when she stood up too quickly. For her occasional constipation she was using MiraLAX as needed about twice a week. I advised her to continue her current Parkinson's disease medication regimen including Sinemet 1 pill 4 times a day and Azilect 1 pill once daily, talked about fall risk and gait safety.   I saw her on 08/18/2016, at which time she reported increase in tremor on the left side. She had some difficulty with sleep at times. She had not tried any over-the-counter medication. She had a fall about 3 months prior in the yard. Thankfully she did not sustain any major injuries. She did bruise her right thigh. I suggested she try to increase her Sinemet to 1 pill 4 times a day and keep her Azilect at 1 mg once daily. She was advised to try melatonin for sleep.   I saw her on 02/19/2016, at which time she reported overall doing quite well, she was active, sewing and making crafts. She had no recent falls but had noticed lightheadedness particularly upon standing up quickly. She had a near fall experience recently at a restaurant. She was not always hydrating well. Mood and memory were stable. Motor-wise, she felt stable as well. I suggested we continue with her medication regimen, including Azilect and Sinemet.Marland Kitchen She was furthermore advised to stay better hydrated and change positions slowly, also start using compression stockings.   I saw her on 08/12/2015, at which time she reported feeling fairly stable. She was going to the gym about 4 times a week, had lost weight, mostly  because of change in taste/lack of taste. Also, she cut out sodas and has been watching what she eats, trying to drink more water. Overall, motor-wise she felt stable, had mild short-term memory issues but no mood or sinister memory issues, no recent falls. Sometimes her left hand tremor was worse. She has 3 grown children and 3 grandchildren. She was on low-dose Lexapro. I suggested she continue with Azilect once daily and Sinemet 1 pill 3 times a day.   I saw her on 02/11/2015, at which time she reported doing better with the recent addition of Sinemet. She felt she had more energy and mobility was better as well. She needed refills on  paper prescriptions because of changing her pharmacies. Her daughter reported that she had stopped taking her antidepressant for a couple of months but her family had noticed more problems with her depression and anxiety and she restarted her medication. She was trying to stay active. She is trying to drink enough water. She did have additional stressors lately because her son was involved in a car accident and was still in the hospital at the time. Overall, however, she had done fairly well. We mutually agreed to continue her on Azilect 1 mg once daily and Sinemet 1 pill 3 times a day.   I saw her on 10/16/2014, at which time she reported that her tremor was a little worse. She also felt that her walking was worse. She had a harder time picking up her feet, particularly on the left side. Thankfully she had not fallen. She was sleeping a little better since switching her antidepressant to nighttime. I asked her to continue with Azilect once daily but also to add a small dose of Sinemet starting with half a pill twice daily with gradual titration to 1 pill 3 times a day.    I saw her on 04/17/2014, at which time she reported doing a little better with regards to her tremor. She noticed a flareup of her tremor with nervousness. She was still able to use her sewing machine. She  had no side effects from her medications. She did find out that she had 2 female cousins on her father's side with Parkinson's disease. I asked her to continue Azilect 1 mg once daily.   I first met her on 10/20/2013 at the request of her primary care physician, at which time she gave a 6-12 month history of left upper extremity tremor. She also reported problems with fine motor skills on the left. Her history and physical exam are in keeping with mild parkinsonism, probably left eye predominant Parkinson's disease. I suggested further workup with a brain MRI and starting her on Azilect. She did not have a brain scan done.    She has noted L sided issues with fine motor skills, resting tremor on the LUE, she feels easily fatigued and it is difficult for her to pick up her L leg sometimes. Thankfully, she has not fallen, but her balance is off at times. She goes to [employer] 4 times a week, but has less stamina. She is a life long non-smoker, does not drink alcohol and has no history of head injury, exposure to pesticides, but did work in a Emergency planning/management officer for 33 years and she [place]. She has no FHx of PD or tremors.   Her Past Medical History Is Significant For: Past Medical History:  Diagnosis Date  . Abnormal involuntary movements(781.0)   . Disorder of bone and cartilage, unspecified   . Elevated blood pressure reading without diagnosis of hypertension   . Major depressive disorder, single episode, unspecified   . Other musculoskeletal symptoms referable to limbs(729.89)   . Pain in joint, lower leg   . Pure hypercholesterolemia     Her Past Surgical History Is Significant For: Past Surgical History:  Procedure Laterality Date  . BREAST SURGERY  1980's   benign cyst  . COLONOSCOPY  1995, 1998  . VAGINAL HYSTERECTOMY  1975    Her Family History Is Significant For: Family History  Problem Relation Age of Onset  . CAD Mother   . Breast cancer Mother   . CAD Father   .  CAD Brother    . Breast cancer Maternal Aunt   . Colon cancer Maternal Aunt   . Hyperlipidemia Sister   . Hypertension Sister     Her Social History Is Significant For: Social History   Socioeconomic History  . Marital status: Married    Spouse name: Not on file  . Number of children: Not on file  . Years of education: Not on file  . Highest education level: Not on file  Occupational History  . Not on file  Tobacco Use  . Smoking status: Never Smoker  . Smokeless tobacco: Never Used  Substance and Sexual Activity  . Alcohol use: No    Alcohol/week: 0.0 standard drinks  . Drug use: No  . Sexual activity: Not on file  Other Topics Concern  . Not on file  Social History Narrative  . Not on file   Social Determinants of Health   Financial Resource Strain:   . Difficulty of Paying Living Expenses:   Food Insecurity:   . Worried About Charity fundraiser in the Last Year:   . Arboriculturist in the Last Year:   Transportation Needs:   . Film/video editor (Medical):   Marland Kitchen Lack of Transportation (Non-Medical):   Physical Activity:   . Days of Exercise per Week:   . Minutes of Exercise per Session:   Stress:   . Feeling of Stress :   Social Connections:   . Frequency of Communication with Friends and Family:   . Frequency of Social Gatherings with Friends and Family:   . Attends Religious Services:   . Active Member of Clubs or Organizations:   . Attends Archivist Meetings:   Marland Kitchen Marital Status:     Her Allergies Are:  No Known Allergies:   Her Current Medications Are:  Outpatient Encounter Medications as of 10/10/2019  Medication Sig  . Calcium Carbonate-Vit D-Min (CALCIUM 1200 PO) Take by mouth daily. Take one tablet a day  . carbidopa-levodopa (SINEMET IR) 25-100 MG tablet TAKE 1 TABLET BY MOUTH 4 TIMES A DAY AT 7 AM, 11 AM, 3 PM and 7 PM, and one if needed in the middle of the night. AWAY FROM MEALTIMES  . cyanocobalamin 1000 MCG tablet Take 100 mcg by mouth  daily.  Mariane Baumgarten Calcium (STOOL SOFTENER PO) Take by mouth.  . entacapone (COMTAN) 200 MG tablet Take 1 tablet (200 mg total) by mouth 3 (three) times daily. Take with Sinemet dose at 7 AM, 11 AM and 3 PM.  . escitalopram (LEXAPRO) 10 MG tablet Take 10 mg by mouth daily.  . fish oil-omega-3 fatty acids 1000 MG capsule Take 2 g by mouth daily.  Marland Kitchen levothyroxine (SYNTHROID, LEVOTHROID) 75 MCG tablet Take 75 mcg by mouth daily.  . rasagiline (AZILECT) 1 MG TABS tablet Take 1 tablet (1 mg total) by mouth daily.  . [DISCONTINUED] Cholecalciferol (VITAMIN D-3 PO) Take by mouth daily. Take one capsule a day  . [DISCONTINUED] Multiple Vitamin (MULTIVITAMIN) capsule Take 1 capsule by mouth daily.   No facility-administered encounter medications on file as of 10/10/2019.  :  Review of Systems:  Out of a complete 14 point review of systems, all are reviewed and negative with the exception of these symptoms as listed below:      Review of Systems  Neurological:       Here for 6 month f/u. Pt denies any falls since last visit.  She does report her sinemet and  comtan are not lasting as well as before.  Example given she takes her 7 am med and 2 hours after taking her meds have worn off.     Objective:  Neurological Exam  Physical Exam Physical Examination:   Vitals:   10/10/19 1035  BP: 114/80  Pulse: 77  SpO2: 97%    General Examination: The patient is a very pleasant 79 y.o. female in no acute distress. She appears more frail and deconditioned today, much more stooped over especially initially during the exam, well groomed.   HEENT:Normocephalic, atraumatic, pupils are equal, round and reactive to light. Corrective eyeglasses in place,bilateral cataracts noted.Extraocular tracking shows saccadic breakdown without nystagmus noted. There is limitation to upper gaze. There is mild decrease in eye blink rate. Hearing is mildly impaired. Face is symmetric withmoderatefacial masking and  normal facial sensation. There is no lip, neck or jaw tremor. Neck is moderately rigid withdecreased ROM and more stooped neck. Oropharynx exam revealsmildmouth dryness. No significant airway crowding is noted. Mallampati is class II. Tongue protrudes centrally and palate elevates symmetrically. There is no drooling.   Chest:is clear to auscultation without wheezing, rhonchi or crackles noted.  Heart:sounds are regular and normal without murmurs, rubs or gallops noted.   Abdomen:is soft, non-tender and non-distended with normal bowel sounds appreciated on auscultation.  Extremities:There isno pitting edema in the distal lower extremities bilaterally.  Skin: is warm and dry with no trophic changes noted. Age-related changes are noted on the skin.  Musculoskeletal: exam reveals no obvious joint deformities, tenderness, joint swelling or erythema.  Neurologically:  Mental status: The patient is awake and alert, paying goodattention, more slow to respond, speech is more hypophonic. Hersisterprovides some details.  Memory is mildly impaired.   Mild degree of bradyphrenia.  Slight dysarthria noted.  Affect more blunted today. Cranial nerves are as described above under HEENT exam.  Motor exam: Normal bulk, and strength for age is noted. There aremild generalized dyskinesias today. Tone is more rigid with presence of cogwheeling in the left upper extremity. There is overall moderateto severe bradykinesia. There is no drift or rebound.  There is a mildresting tremor in the left upper extremity and no other resting tremor. Romberg isnot tested d/t safetyconcern.Fine motor skills exam:mild to moderately impaired on the right and moderate to significantly impaired on the left today.   Cerebellar testing shows no dysmetria or intention tremor on finger to nose testing.there is no truncal or gait ataxia.   Sensory exam is intact to light touch in the upper and lower  extremities.   Gait, station and balance: She stands up from the seated position with more difficulty today and requires mild assistance.  She has a more significantly stooped posture today, she stands more wide base.  She uses a 4 pronged cane on the right side, she walks slowly and cautiously, mild start hesitation, some difficulty turning.  We did use a clinic wheelchair to help her get to check out today.   Balance is impaired.   Assessment and Plan:   In summary, Shanyla Marconi is a very pleasant 79 year old female with an underlying medical history of hyperlipidemia, history of pituitary microadenoma, precancerous breast lesions(withs/pdouble mastectomy in the 1980s), who presents for follow-up consultation of her left-sided predominant Parkinson's disease with symptoms dating back toabout6years ago.  She has had fairly stable findings of mild progression over time, significantly more notable progression in the past few months.  She has had some falls in the past.  She  has developed some dyskinesias over time, she has had intermittent mild visual hallucinations.  All in all, she had done quite well in the past 5+ years but has had a more steeper decline lately.  Unfortunately, she has also had increase in stress what with her husband's medical issues and memory loss, concern for dementia.  She does have a good psychosocial support system and that her sisters are helping out and involved in her care and her kids are also helping out and stepped up more to help her cope with her husband's issues and they have also been more involved in his care and his medication administration.   She started C/Lin July 2016. She is also on Azilect 1 mg once daily, started thisin July 2015. Sinemet was increased in 5/18 to1 pill4times a day. She has hada fall with injury to the ribs.For depression and anxiety she has been maintained on Lexapro 10 mg daily.  She was advised to increase Comtan from 1 pill  twice daily to 1 pill 3 times daily in January 2021.  She has been on Sinemet 1 pill 4 times a day and 1 optional pill in the middle of the night.  She is advised to increase her Sinemet consistently to 5 times a day during the day starting at 7 AM, 3 hourly.  She is advised to continue with Comtan 3 times daily, 1 pill at 7 AM, 1 at 1 PM and 1 at 7 PM.  We can increase the Comtan gradually to up to 5 pills a day alongside her Sinemet doses in the future so long as she does not have any side effects such as dyskinesias or hallucinations.   We started Comtan in June 2020.   I suggested we proceed with physical therapy evaluation and home health physical therapy if possible.  She is agreeable to this.  She has had decreased mobility and decline in her postural stability.   I advised her to return for a follow-up evaluation in 3 to 4 months, sooner if needed.  I answered all the questions today and the patient and her sister were in agreement.

## 2019-10-10 NOTE — Telephone Encounter (Signed)
Received letter copy from pt during 10/10/2019 o/v stating she has been approved for [employer]- ID # [id number] Grp # [id number] Effective 10/21/2019-10/19/2020.. Assistance program: parkinson's disease

## 2019-10-16 ENCOUNTER — Encounter (HOSPITAL_COMMUNITY): Payer: Self-pay | Admitting: *Deleted

## 2019-10-16 ENCOUNTER — Emergency Department (HOSPITAL_COMMUNITY)
Admission: EM | Admit: 2019-10-16 | Discharge: 2019-10-16 | Disposition: A | Discharge status: Home / Self Care | Payer: Medicare HMO | Attending: Emergency Medicine | Admitting: Emergency Medicine

## 2019-10-16 ENCOUNTER — Other Ambulatory Visit: Payer: Self-pay

## 2019-10-16 DIAGNOSIS — Z5321 Procedure and treatment not carried out due to patient leaving prior to being seen by health care provider: Secondary | ICD-10-CM | POA: Diagnosis present

## 2019-10-16 LAB — COMPREHENSIVE METABOLIC PANEL
ALT: 9 U/L (ref 0–44)
AST: 10 U/L — ABNORMAL LOW (ref 15–41)
Albumin: 4 g/dL (ref 3.5–5.0)
Alkaline Phosphatase: 51 U/L (ref 38–126)
Anion gap: 8 (ref 5–15)
BUN: 23 mg/dL (ref 8–23)
CO2: 25 mmol/L (ref 22–32)
Calcium: 9.3 mg/dL (ref 8.9–10.3)
Chloride: 106 mmol/L (ref 98–111)
Creatinine, Ser: 0.96 mg/dL (ref 0.44–1.00)
GFR calc Af Amer: >60 mL/min (ref >60)
GFR calc non Af Amer: 56 mL/min — ABNORMAL LOW (ref >60)
Glucose, Bld: 111 mg/dL — ABNORMAL HIGH (ref 70–99)
Potassium: 4.1 mmol/L (ref 3.5–5.1)
Sodium: 139 mmol/L (ref 135–145)
Total Bilirubin: 1.5 mg/dL — ABNORMAL HIGH (ref 0.3–1.2)
Total Protein: 6.7 g/dL (ref 6.5–8.1)

## 2019-10-16 LAB — CBC
HCT: 38.6 % (ref 36.0–46.0)
Hemoglobin: 12.7 g/dL (ref 12.0–15.0)
MCH: 29.9 pg (ref 26.0–34.0)
MCHC: 32.9 g/dL (ref 30.0–36.0)
MCV: 90.8 fL (ref 80.0–100.0)
Platelets: 253 10*3/uL (ref 150–400)
RBC: 4.25 MIL/uL (ref 3.87–5.11)
RDW: 13.4 % (ref 11.5–15.5)
WBC: 5.7 10*3/uL (ref 4.0–10.5)
nRBC: 0 % (ref 0.0–0.2)

## 2019-10-16 LAB — LIPASE, BLOOD: Lipase: 26 U/L (ref 11–51)

## 2019-10-16 MED ORDER — SODIUM CHLORIDE 0.9% FLUSH: 3.0000 mL | Freq: Once | INTRAVENOUS | Status: DC

## 2019-10-16 NOTE — ED Notes (Signed)
Pt stated she wanted to leave and go home

## 2019-10-22 DIAGNOSIS — G8929 Other chronic pain: Secondary | ICD-10-CM | POA: Diagnosis not present

## 2019-10-22 DIAGNOSIS — G3184 Mild cognitive impairment, so stated: Secondary | ICD-10-CM | POA: Diagnosis not present

## 2019-10-22 DIAGNOSIS — G20 Parkinson's disease: Secondary | ICD-10-CM | POA: Diagnosis not present

## 2019-10-22 DIAGNOSIS — R69 Illness, unspecified: Secondary | ICD-10-CM | POA: Diagnosis not present

## 2019-10-22 DIAGNOSIS — E039 Hypothyroidism, unspecified: Secondary | ICD-10-CM | POA: Diagnosis not present

## 2019-10-22 DIAGNOSIS — I951 Orthostatic hypotension: Secondary | ICD-10-CM | POA: Diagnosis not present

## 2019-10-22 DIAGNOSIS — K5900 Constipation, unspecified: Secondary | ICD-10-CM | POA: Diagnosis not present

## 2019-10-22 DIAGNOSIS — K219 Gastro-esophageal reflux disease without esophagitis: Secondary | ICD-10-CM | POA: Diagnosis not present

## 2019-10-22 DIAGNOSIS — G4700 Insomnia, unspecified: Secondary | ICD-10-CM | POA: Diagnosis not present

## 2019-11-01 ENCOUNTER — Observation Stay (HOSPITAL_COMMUNITY)
Admission: EM | Admit: 2019-11-01 | Discharge: 2019-11-08 | Disposition: A | Discharge status: Home / Self Care | Payer: Medicare HMO | Attending: Internal Medicine | Admitting: Internal Medicine

## 2019-11-01 ENCOUNTER — Other Ambulatory Visit: Payer: Self-pay

## 2019-11-01 ENCOUNTER — Encounter (HOSPITAL_COMMUNITY): Payer: Self-pay

## 2019-11-01 ENCOUNTER — Emergency Department (HOSPITAL_COMMUNITY): Payer: Medicare HMO

## 2019-11-01 DIAGNOSIS — R0902 Hypoxemia: Secondary | ICD-10-CM | POA: Diagnosis not present

## 2019-11-01 DIAGNOSIS — S42294A Other nondisplaced fracture of upper end of right humerus, initial encounter for closed fracture: Secondary | ICD-10-CM

## 2019-11-01 DIAGNOSIS — Y92009 Unspecified place in unspecified non-institutional (private) residence as the place of occurrence of the external cause: Secondary | ICD-10-CM | POA: Insufficient documentation

## 2019-11-01 DIAGNOSIS — R4020 Unspecified coma: Secondary | ICD-10-CM | POA: Diagnosis not present

## 2019-11-01 DIAGNOSIS — G20 Parkinson's disease: Secondary | ICD-10-CM | POA: Insufficient documentation

## 2019-11-01 DIAGNOSIS — Y939 Activity, unspecified: Secondary | ICD-10-CM | POA: Insufficient documentation

## 2019-11-01 DIAGNOSIS — S42201A Unspecified fracture of upper end of right humerus, initial encounter for closed fracture: Secondary | ICD-10-CM | POA: Diagnosis not present

## 2019-11-01 DIAGNOSIS — W19XXXA Unspecified fall, initial encounter: Secondary | ICD-10-CM | POA: Insufficient documentation

## 2019-11-01 DIAGNOSIS — Z20822 Contact with and (suspected) exposure to covid-19: Secondary | ICD-10-CM | POA: Diagnosis not present

## 2019-11-01 DIAGNOSIS — R0789 Other chest pain: Secondary | ICD-10-CM | POA: Diagnosis not present

## 2019-11-01 DIAGNOSIS — Y999 Unspecified external cause status: Secondary | ICD-10-CM | POA: Diagnosis not present

## 2019-11-01 DIAGNOSIS — J9811 Atelectasis: Secondary | ICD-10-CM | POA: Diagnosis not present

## 2019-11-01 DIAGNOSIS — R2681 Unsteadiness on feet: Secondary | ICD-10-CM | POA: Insufficient documentation

## 2019-11-01 DIAGNOSIS — R531 Weakness: Secondary | ICD-10-CM | POA: Diagnosis not present

## 2019-11-01 DIAGNOSIS — S42209A Unspecified fracture of upper end of unspecified humerus, initial encounter for closed fracture: Secondary | ICD-10-CM | POA: Diagnosis present

## 2019-11-01 DIAGNOSIS — R0602 Shortness of breath: Secondary | ICD-10-CM | POA: Diagnosis not present

## 2019-11-01 DIAGNOSIS — R42 Dizziness and giddiness: Secondary | ICD-10-CM | POA: Diagnosis not present

## 2019-11-01 DIAGNOSIS — S42301A Unspecified fracture of shaft of humerus, right arm, initial encounter for closed fracture: Secondary | ICD-10-CM | POA: Diagnosis not present

## 2019-11-01 DIAGNOSIS — M25511 Pain in right shoulder: Secondary | ICD-10-CM | POA: Diagnosis not present

## 2019-11-01 DIAGNOSIS — E039 Hypothyroidism, unspecified: Secondary | ICD-10-CM | POA: Insufficient documentation

## 2019-11-01 DIAGNOSIS — S2241XA Multiple fractures of ribs, right side, initial encounter for closed fracture: Secondary | ICD-10-CM | POA: Diagnosis not present

## 2019-11-01 DIAGNOSIS — S42291A Other displaced fracture of upper end of right humerus, initial encounter for closed fracture: Secondary | ICD-10-CM | POA: Diagnosis not present

## 2019-11-01 DIAGNOSIS — Z8669 Personal history of other diseases of the nervous system and sense organs: Secondary | ICD-10-CM | POA: Diagnosis not present

## 2019-11-01 DIAGNOSIS — I10 Essential (primary) hypertension: Secondary | ICD-10-CM | POA: Diagnosis not present

## 2019-11-01 DIAGNOSIS — S0990XA Unspecified injury of head, initial encounter: Secondary | ICD-10-CM | POA: Diagnosis not present

## 2019-11-01 DIAGNOSIS — R0600 Dyspnea, unspecified: Secondary | ICD-10-CM

## 2019-11-01 DIAGNOSIS — R079 Chest pain, unspecified: Secondary | ICD-10-CM | POA: Diagnosis not present

## 2019-11-01 DIAGNOSIS — S4991XA Unspecified injury of right shoulder and upper arm, initial encounter: Secondary | ICD-10-CM | POA: Diagnosis present

## 2019-11-01 MED ORDER — FENTANYL CITRATE (PF) 100 MCG/2ML IJ SOLN
50.0000 ug | Freq: Once | INTRAMUSCULAR | Status: AC
  Administered 2019-11-01: 50 ug via INTRAVENOUS
  Filled 2019-11-01: qty 2

## 2019-11-01 NOTE — ED Provider Notes (Signed)
[facility]-EMERGENCY DEPT Provider Note   CSN: [id number] Arrival date & time: 11/01/19  2236     History No chief complaint on file.   Tami Vega is a 79 y.o. female.  Patient to ED with right shoulder pain after fall at home. History of Parkinson's with unsteady gait, not using cane in the home tonight, fell onto the right shoulder, unknown if she hit her head but no LOC reported. No nausea or vomiting since the fall. Not anticoagulated. Denies neck or back pain. No chest pain, SOB, abdominal pain. No LE pain since the fall. She reports lightheadedness prior to fall tonight which has been happening with positional changes recently.   The history is provided by the patient and the EMS personnel. No language interpreter was used.       Past Medical History:  Diagnosis Date  . Abnormal involuntary movements(781.0)   . Disorder of bone and cartilage, unspecified   . Elevated blood pressure reading without diagnosis of hypertension   . Major depressive disorder, single episode, unspecified   . Other musculoskeletal symptoms referable to limbs(729.89)   . Pain in joint, lower leg   . Pure hypercholesterolemia     Patient Active Problem List   Diagnosis Date Noted  . Chest discomfort 12/09/2012    Past Surgical History:  Procedure Laterality Date  . BREAST SURGERY  1980's   benign cyst  . COLONOSCOPY  1995, 1998  . VAGINAL HYSTERECTOMY  1975     OB History   No obstetric history on file.     Family History  Problem Relation Age of Onset  . CAD Mother   . Breast cancer Mother   . CAD Father   . CAD Brother   . Breast cancer Maternal Aunt   . Colon cancer Maternal Aunt   . Hyperlipidemia Sister   . Hypertension Sister     Social History   Tobacco Use  . Smoking status: Never Smoker  . Smokeless tobacco: Never Used  Substance Use Topics  . Alcohol use: No    Alcohol/week: 0.0 standard drinks  . Drug use: No    Home  Medications Prior to Admission medications   Medication Sig Start Date End Date Taking? Authorizing Provider  Calcium Carbonate-Vit D-Min (CALCIUM 1200 PO) Take by mouth daily. Take one tablet a day    [provider]  carbidopa-levodopa (SINEMET IR) 25-100 MG tablet Take 1 tablet by mouth 5 (five) times daily. Take at  7 AM, 10 AM, 1 PM, 4 PM and  7 PM 10/10/19   Huston Foley, MD  cyanocobalamin 1000 MCG tablet Take 100 mcg by mouth daily.    [provider]  Docusate Calcium (STOOL SOFTENER PO) Take by mouth.    [provider]  entacapone (COMTAN) 200 MG tablet Take 1 tablet (200 mg total) by mouth 3 (three) times daily. Take with Sinemet dose at 7 AM, 1 PM and 7 PM. 10/10/19   Huston Foley, MD  escitalopram (LEXAPRO) 10 MG tablet Take 10 mg by mouth daily. 01/31/17   [provider]  fish oil-omega-3 fatty acids 1000 MG capsule Take 2 g by mouth daily.    [provider]  levothyroxine (SYNTHROID, LEVOTHROID) 75 MCG tablet Take 75 mcg by mouth daily. 01/29/18   [provider]  rasagiline (AZILECT) 1 MG TABS tablet Take 1 tablet (1 mg total) by mouth daily. 12/07/18   Huston Foley, MD    Allergies  Patient has no known allergies.  Review of Systems   Review of Systems  Constitutional: Negative for chills and fever.  Respiratory: Negative.  Negative for shortness of breath.   Cardiovascular: Negative.  Negative for chest pain.  Gastrointestinal: Negative.  Negative for abdominal pain, nausea and vomiting.  Musculoskeletal: Negative for back pain and neck pain.       Right shoulder pain  Skin: Negative.  Negative for wound.  Neurological: Positive for weakness and light-headedness. Negative for syncope and headaches.    Physical Exam Updated Vital Signs BP (!) 146/80   Pulse 85   Temp 98.5 F (36.9 C)   Resp (!) 25   SpO2 93%   Physical Exam Vitals and nursing note reviewed.  Constitutional:      Appearance: She is  well-developed.     Comments: Uncomfortable appearing  HENT:     Head: Normocephalic and atraumatic.  Eyes:     Conjunctiva/sclera: Conjunctivae normal.  Cardiovascular:     Rate and Rhythm: Normal rate and regular rhythm.     Pulses: Normal pulses.     Heart sounds: No murmur heard.   Pulmonary:     Effort: Pulmonary effort is normal.     Breath sounds: Normal breath sounds. No wheezing, rhonchi or rales.  Abdominal:     General: Bowel sounds are normal.     Palpations: Abdomen is soft.     Tenderness: There is no abdominal tenderness. There is no guarding or rebound.  Musculoskeletal:        General: Normal range of motion.     Cervical back: Normal range of motion and neck supple.     Comments: No midline cervical tenderness. Proximal right UE tenderness without significant deformity. No elbow or FA tenderness. Distal pulses present.   Skin:    General: Skin is warm and dry.     Findings: No rash.  Neurological:     Mental Status: She is alert and oriented to person, place, and time.     Sensory: No sensory deficit.     Comments: Generalized resting tremor     ED Results / Procedures / Treatments   Labs (all labs ordered are listed, but only abnormal results are displayed) Labs Reviewed  URINE CULTURE  SARS CORONAVIRUS 2 BY RT PCR (HOSPITAL ORDER, PERFORMED IN [facility])  CBC WITH DIFFERENTIAL/PLATELET  COMPREHENSIVE METABOLIC PANEL  URINALYSIS, ROUTINE W REFLEX MICROSCOPIC   Results for orders placed or performed during the hospital encounter of 11/01/19  CBC with Differential  Result Value Ref Range   WBC 5.8 4.0 - 10.5 K/uL   RBC 4.04 3.87 - 5.11 MIL/uL   Hemoglobin 12.5 12.0 - 15.0 g/dL   HCT 35.5 36 - 46 %   MCV 89.4 80.0 - 100.0 fL   MCH 30.9 26.0 - 34.0 pg   MCHC 34.6 30.0 - 36.0 g/dL   RDW 73.2 20.2 - 54.2 %   Platelets 159 150 - 400 K/uL   nRBC 0.0 0.0 - 0.2 %   Neutrophils Relative % 49 %   Neutro Abs 2.8 1.7 - 7.7 K/uL    Lymphocytes Relative 24 %   Lymphs Abs 1.4 0.7 - 4.0 K/uL   Monocytes Relative 23 %   Monocytes Absolute 1.3 (H) 0 - 1 K/uL   Eosinophils Relative 4 %   Eosinophils Absolute 0.2 0 - 0 K/uL   Basophils Relative 0 %   Basophils Absolute 0.0 0 - 0 K/uL  Immature Granulocytes 0 %   Abs Immature Granulocytes 0.02 0.00 - 0.07 K/uL  Comprehensive metabolic panel  Result Value Ref Range   Sodium 136 135 - 145 mmol/L   Potassium 3.6 3.5 - 5.1 mmol/L   Chloride 105 98 - 111 mmol/L   CO2 24 22 - 32 mmol/L   Glucose, Bld 113 (H) 70 - 99 mg/dL   BUN 33 (H) 8 - 23 mg/dL   Creatinine, Ser 8.46 0.44 - 1.00 mg/dL   Calcium 9.0 8.9 - 65.9 mg/dL   Total Protein 6.7 6.5 - 8.1 g/dL   Albumin 4.2 3.5 - 5.0 g/dL   AST 11 (L) 15 - 41 U/L   ALT 9 0 - 44 U/L   Alkaline Phosphatase 47 38 - 126 U/L   Total Bilirubin 1.3 (H) 0.3 - 1.2 mg/dL   GFR calc non Af Amer >60 >60 mL/min   GFR calc Af Amer >60 >60 mL/min   Anion gap 7 5 - 15    EKG None  Radiology No results found.  Procedures Procedures (including critical care time)  Medications Ordered in ED Medications  fentaNYL (SUBLIMAZE) injection 50 mcg (has no administration in time range)    ED Course  I have reviewed the triage vital signs and the nursing notes.  Pertinent labs & imaging results that were available during my care of the patient were reviewed by me and considered in my medical decision making (see chart for details).    MDM Rules/Calculators/A&P                          Patient to ED with right UE injury after unwitnessed fall. REcent history of dizziness/lightheadedness with position changes.   She has a humeral head fracture, closed injury. Pain has been difficult to control in the ED. Immobilization applied for comfort.   Discussed with family (son, Brett Canales, is POA [phone]). Does not feel the husband is capable of providing care.   Elderly patient with recent unsteady gait, h/o parkinson's, positional  lightheadedness, fracture after fall with difficult to control pain. Feel admission is indicated for pain control, consideration of nursing home rehab prior to return home.  Final Clinical Impression(s) / ED Diagnoses Final diagnoses:  None   1. Fall 2. Right humerus fracture 3. Unsteady gait 4. H/o Parkinson's  Rx / DC Orders ED Discharge Orders    None       Danne Harbor 11/02/19 0232    Palumbo, April, MD 11/02/19 [phone]

## 2019-11-01 NOTE — ED Triage Notes (Signed)
Pt arrived via [facility] from home CC Mechanical Fall and right shoulder pain. Per EMS no obvious deformity, arm in comfortable position in sling. Per EMS ambulatory and A/OX4  Hx Parkinson's, HTN, thyroid issues

## 2019-11-02 ENCOUNTER — Observation Stay (HOSPITAL_COMMUNITY): Payer: Medicare HMO

## 2019-11-02 ENCOUNTER — Encounter (HOSPITAL_COMMUNITY): Payer: Self-pay | Admitting: Internal Medicine

## 2019-11-02 ENCOUNTER — Emergency Department (HOSPITAL_COMMUNITY): Payer: Medicare HMO

## 2019-11-02 DIAGNOSIS — G20 Parkinson's disease: Secondary | ICD-10-CM | POA: Diagnosis not present

## 2019-11-02 DIAGNOSIS — M47813 Spondylosis without myelopathy or radiculopathy, cervicothoracic region: Secondary | ICD-10-CM | POA: Diagnosis not present

## 2019-11-02 DIAGNOSIS — R42 Dizziness and giddiness: Secondary | ICD-10-CM

## 2019-11-02 DIAGNOSIS — E039 Hypothyroidism, unspecified: Secondary | ICD-10-CM | POA: Diagnosis present

## 2019-11-02 DIAGNOSIS — M47812 Spondylosis without myelopathy or radiculopathy, cervical region: Secondary | ICD-10-CM | POA: Diagnosis not present

## 2019-11-02 DIAGNOSIS — S0990XA Unspecified injury of head, initial encounter: Secondary | ICD-10-CM | POA: Diagnosis not present

## 2019-11-02 DIAGNOSIS — S42209A Unspecified fracture of upper end of unspecified humerus, initial encounter for closed fracture: Secondary | ICD-10-CM | POA: Diagnosis present

## 2019-11-02 DIAGNOSIS — S199XXA Unspecified injury of neck, initial encounter: Secondary | ICD-10-CM | POA: Diagnosis not present

## 2019-11-02 LAB — BASIC METABOLIC PANEL
Anion gap: 8 (ref 5–15)
BUN: 29 mg/dL — ABNORMAL HIGH (ref 8–23)
CO2: 19 mmol/L — ABNORMAL LOW (ref 22–32)
Calcium: 7.3 mg/dL — ABNORMAL LOW (ref 8.9–10.3)
Chloride: 108 mmol/L (ref 98–111)
Creatinine, Ser: 0.59 mg/dL (ref 0.44–1.00)
GFR calc Af Amer: >60 mL/min (ref >60)
GFR calc non Af Amer: >60 mL/min (ref >60)
Glucose, Bld: 108 mg/dL — ABNORMAL HIGH (ref 70–99)
Potassium: 3.1 mmol/L — ABNORMAL LOW (ref 3.5–5.1)
Sodium: 135 mmol/L (ref 135–145)

## 2019-11-02 LAB — URINALYSIS, ROUTINE W REFLEX MICROSCOPIC
Bilirubin Urine: NEGATIVE
Glucose, UA: NEGATIVE mg/dL
Ketones, ur: 5 mg/dL — AB
Nitrite: NEGATIVE
Protein, ur: NEGATIVE mg/dL
Specific Gravity, Urine: 1.014 (ref 1.005–1.030)
pH: 6 (ref 5.0–8.0)

## 2019-11-02 LAB — COMPREHENSIVE METABOLIC PANEL
ALT: 9 U/L (ref 0–44)
AST: 11 U/L — ABNORMAL LOW (ref 15–41)
Albumin: 4.2 g/dL (ref 3.5–5.0)
Alkaline Phosphatase: 47 U/L (ref 38–126)
Anion gap: 7 (ref 5–15)
BUN: 33 mg/dL — ABNORMAL HIGH (ref 8–23)
CO2: 24 mmol/L (ref 22–32)
Calcium: 9 mg/dL (ref 8.9–10.3)
Chloride: 105 mmol/L (ref 98–111)
Creatinine, Ser: 0.9 mg/dL (ref 0.44–1.00)
GFR calc Af Amer: >60 mL/min (ref >60)
GFR calc non Af Amer: >60 mL/min (ref >60)
Glucose, Bld: 113 mg/dL — ABNORMAL HIGH (ref 70–99)
Potassium: 3.6 mmol/L (ref 3.5–5.1)
Sodium: 136 mmol/L (ref 135–145)
Total Bilirubin: 1.3 mg/dL — ABNORMAL HIGH (ref 0.3–1.2)
Total Protein: 6.7 g/dL (ref 6.5–8.1)

## 2019-11-02 LAB — CBC WITH DIFFERENTIAL/PLATELET
Abs Immature Granulocytes: 0.02 10*3/uL (ref 0.00–0.07)
Basophils Absolute: 0 10*3/uL (ref 0.0–0.1)
Basophils Relative: 0 %
Eosinophils Absolute: 0.2 10*3/uL (ref 0.0–0.5)
Eosinophils Relative: 4 %
HCT: 36.1 % (ref 36.0–46.0)
Hemoglobin: 12.5 g/dL (ref 12.0–15.0)
Immature Granulocytes: 0 %
Lymphocytes Relative: 24 %
Lymphs Abs: 1.4 10*3/uL (ref 0.7–4.0)
MCH: 30.9 pg (ref 26.0–34.0)
MCHC: 34.6 g/dL (ref 30.0–36.0)
MCV: 89.4 fL (ref 80.0–100.0)
Monocytes Absolute: 1.3 10*3/uL — ABNORMAL HIGH (ref 0.1–1.0)
Monocytes Relative: 23 %
Neutro Abs: 2.8 10*3/uL (ref 1.7–7.7)
Neutrophils Relative %: 49 %
Platelets: 159 10*3/uL (ref 150–400)
RBC: 4.04 MIL/uL (ref 3.87–5.11)
RDW: 13.3 % (ref 11.5–15.5)
WBC: 5.8 10*3/uL (ref 4.0–10.5)
nRBC: 0 % (ref 0.0–0.2)

## 2019-11-02 LAB — CBC
HCT: 30.2 % — ABNORMAL LOW (ref 36.0–46.0)
Hemoglobin: 10.2 g/dL — ABNORMAL LOW (ref 12.0–15.0)
MCH: 30.8 pg (ref 26.0–34.0)
MCHC: 33.8 g/dL (ref 30.0–36.0)
MCV: 91.2 fL (ref 80.0–100.0)
Platelets: 156 10*3/uL (ref 150–400)
RBC: 3.31 MIL/uL — ABNORMAL LOW (ref 3.87–5.11)
RDW: 13.3 % (ref 11.5–15.5)
WBC: 7.7 10*3/uL (ref 4.0–10.5)
nRBC: 0 % (ref 0.0–0.2)

## 2019-11-02 LAB — VITAMIN D 25 HYDROXY (VIT D DEFICIENCY, FRACTURES): Vit D, 25-Hydroxy: 25.26 ng/mL — ABNORMAL LOW (ref 30–100)

## 2019-11-02 LAB — SARS CORONAVIRUS 2 BY RT PCR (HOSPITAL ORDER, PERFORMED IN ~~LOC~~ HOSPITAL LAB): SARS Coronavirus 2: NEGATIVE

## 2019-11-02 LAB — TSH: TSH: 1.608 u[IU]/mL (ref 0.350–4.500)

## 2019-11-02 MED ORDER — OMEGA-3-ACID ETHYL ESTERS 1 G PO CAPS
1.0000 g | ORAL_CAPSULE | Freq: Two times a day (BID) | ORAL | Status: DC
  Administered 2019-11-02 – 2019-11-08 (×13): 1 g via ORAL
  Filled 2019-11-02 (×14): qty 1

## 2019-11-02 MED ORDER — ESCITALOPRAM OXALATE 10 MG PO TABS
10.0000 mg | ORAL_TABLET | Freq: Every day | ORAL | Status: DC
  Administered 2019-11-02 – 2019-11-08 (×7): 10 mg via ORAL
  Filled 2019-11-02 (×7): qty 1

## 2019-11-02 MED ORDER — ONDANSETRON HCL 4 MG/2ML IJ SOLN: 4.0000 mg | Freq: Four times a day (QID) | INTRAMUSCULAR | Status: DC | PRN

## 2019-11-02 MED ORDER — HEPARIN SODIUM (PORCINE) 5000 UNIT/ML IJ SOLN
5000.0000 [IU] | Freq: Three times a day (TID) | INTRAMUSCULAR | Status: DC
  Administered 2019-11-02 – 2019-11-08 (×19): 5000 [IU] via SUBCUTANEOUS
  Filled 2019-11-02 (×19): qty 1

## 2019-11-02 MED ORDER — FENTANYL CITRATE (PF) 100 MCG/2ML IJ SOLN
12.5000 ug | INTRAMUSCULAR | Status: DC | PRN
  Administered 2019-11-02 (×2): 12.5 ug via INTRAVENOUS
  Filled 2019-11-02 (×2): qty 2

## 2019-11-02 MED ORDER — SODIUM CHLORIDE 0.9 % IV SOLN: INTRAVENOUS | Status: DC

## 2019-11-02 MED ORDER — SENNOSIDES-DOCUSATE SODIUM 8.6-50 MG PO TABS: 2.0000 | ORAL_TABLET | Freq: Every evening | ORAL | Status: DC | PRN

## 2019-11-02 MED ORDER — ONDANSETRON HCL 4 MG PO TABS: 4.0000 mg | ORAL_TABLET | Freq: Four times a day (QID) | ORAL | Status: DC | PRN

## 2019-11-02 MED ORDER — MELATONIN 5 MG PO TABS
5.0000 mg | ORAL_TABLET | Freq: Every evening | ORAL | Status: DC | PRN
  Administered 2019-11-02 – 2019-11-05 (×3): 5 mg via ORAL
  Filled 2019-11-02 (×4): qty 1

## 2019-11-02 MED ORDER — POLYETHYLENE GLYCOL 3350 17 G PO PACK: 17.0000 g | PACK | Freq: Every day | ORAL | Status: DC | PRN

## 2019-11-02 MED ORDER — OXYCODONE HCL 5 MG PO TABS
5.0000 mg | ORAL_TABLET | ORAL | Status: DC | PRN
  Administered 2019-11-03 – 2019-11-07 (×8): 5 mg via ORAL
  Filled 2019-11-02 (×8): qty 1

## 2019-11-02 MED ORDER — OMEGA-3 FATTY ACIDS 1000 MG PO CAPS: 2.0000 g | ORAL_CAPSULE | Freq: Every day | ORAL | Status: DC

## 2019-11-02 MED ORDER — POLYETHYLENE GLYCOL 3350 17 G PO PACK
17.0000 g | PACK | Freq: Every day | ORAL | Status: DC | PRN
  Administered 2019-11-05: 17 g via ORAL
  Filled 2019-11-02: qty 1

## 2019-11-02 MED ORDER — FENTANYL CITRATE (PF) 100 MCG/2ML IJ SOLN
50.0000 ug | Freq: Once | INTRAMUSCULAR | Status: AC
  Administered 2019-11-02: 50 ug via INTRAVENOUS
  Filled 2019-11-02: qty 2

## 2019-11-02 MED ORDER — LORAZEPAM 2 MG/ML IJ SOLN
0.5000 mg | Freq: Once | INTRAMUSCULAR | Status: AC
  Administered 2019-11-02: 0.5 mg via INTRAVENOUS
  Filled 2019-11-02: qty 1

## 2019-11-02 MED ORDER — CARBIDOPA-LEVODOPA 25-100 MG PO TABS
1.0000 | ORAL_TABLET | ORAL | Status: DC
  Administered 2019-11-02 – 2019-11-08 (×32): 1 via ORAL
  Filled 2019-11-02 (×32): qty 1

## 2019-11-02 MED ORDER — ENTACAPONE 200 MG PO TABS
200.0000 mg | ORAL_TABLET | ORAL | Status: DC
  Administered 2019-11-02 – 2019-11-08 (×19): 200 mg via ORAL
  Filled 2019-11-02 (×21): qty 1

## 2019-11-02 MED ORDER — LEVOTHYROXINE SODIUM 88 MCG PO TABS
88.0000 ug | ORAL_TABLET | Freq: Every day | ORAL | Status: DC
  Administered 2019-11-02 – 2019-11-08 (×7): 88 ug via ORAL
  Filled 2019-11-02 (×7): qty 1

## 2019-11-02 MED ORDER — ADULT MULTIVITAMIN W/MINERALS CH
1.0000 | ORAL_TABLET | Freq: Every day | ORAL | Status: DC
  Administered 2019-11-02 – 2019-11-08 (×7): 1 via ORAL
  Filled 2019-11-02 (×7): qty 1

## 2019-11-02 NOTE — ED Notes (Signed)
Call received from pt son Thresea Doble [phone] requesting rtn call for pt status/updates when possible. [employer]

## 2019-11-02 NOTE — ED Notes (Signed)
Pt placed on pur wick

## 2019-11-02 NOTE — Evaluation (Signed)
Physical Therapy Evaluation Patient Details Name: Tami Vega MRN: [id number] DOB: Feb 06, 1941 Today's Date: 11/02/2019   History of Present Illness  Patient is an 79 y.o. female with history of Parkinson's disease, hypothyroidism presenting to [facility] after a fall at home when patient felt dizzy.  As per the patient patient has been feeling dizzy for the last a few days particularly when she stands up from a sitting position. Pt reports a fall the evening before admission resulting in pt hitting her head on the floor with residual pain in her right shoulder and some neck pain.  Denies any other weakness.  Denies chest pain or shortness of breath. X-rays in the ER revealed right proximal humerus fracture for which ER physician placed patient on sling. CT head is unremarkable CT neck unremarkable    Clinical Impression  Tami Vega is 79 y.o. female admitted with above HPI and diagnosis. Patient is currently limited by functional impairments below (see PT problem list). Patient lives with her husband and per pt and son she is modified independent with RW/quad cane at baseline. Patient's son reports she does not use AD as regularly as she should. Patient now requires Max assist for bed mobility and is limited by pain in Rt shoulder. Patient will benefit from continued skilled PT interventions to address impairments and progress independence with mobility, recommending SNF. Acute PT will follow and progress as able.     Follow Up Recommendations SNF;Supervision/Assistance - 24 hour    Equipment Recommendations  Other (comment) (TBA)    Recommendations for Other Services OT consult     Precautions / Restrictions Precautions Precautions: Fall Precaution Comments: pt reports this is her only fall in the last 6 months Required Braces or Orthoses: Sling Restrictions Weight Bearing Restrictions: Yes RUE Weight Bearing: Non weight bearing Other Position/Activity Restrictions: NWB on Rt shoulder,  sling at all times, pt able to move Rt elbow and wrist as tolerated      Mobility  Bed Mobility Overal bed mobility: Needs Assistance Bed Mobility: Supine to Sit;Sit to Supine;Rolling Rolling: Max assist   Supine to sit: Max assist;+2 for safety/equipment;+2 for physical assistance Sit to supine: Max assist;+2 for physical assistance;+2 for safety/equipment   General bed mobility comments: Patient required Max +2 assist to sit up EOB for sling re-adjustment. Ortho PA, Jason Coop present as +2. Patient then requried Max assist +1 to roll onto Lt side and experienced BM. RN arrived to assist with Lt/Rt rolling for pericare and brief change. pt repositioned in bed and further mobility deferred due to pain.  Transfers           Ambulation/Gait         Stairs            Wheelchair Mobility    Modified Rankin (Stroke Patients Only)       Balance Overall balance assessment: Needs assistance Sitting-balance support: Feet unsupported;Single extremity supported Sitting balance-Leahy Scale: Poor Sitting balance - Comments: pt with slouch posture at EOB, reliant on support to maintain balance.              Pertinent Vitals/Pain Pain Assessment: Faces Faces Pain Scale: Hurts whole lot Pain Location: Rt shoulder wtih bed mobility Pain Descriptors / Indicators: Aching;Grimacing;Guarding;Discomfort Pain Intervention(s): Limited activity within patient's tolerance;Monitored during session;Repositioned    Home Living Family/patient expects to be discharged to:: Private residence Living Arrangements: Spouse/significant other Available Help at Discharge: Family Type of Home: House Home Access: Stairs to enter Entrance Stairs-Rails: Furniture conservator/restorer  Stairs-Number of Steps: 2 Home Layout: One level Home Equipment: Shower seat;Grab bars - tub/shower;Walker - 2 wheels;Cane - quad Additional Comments: son lives close by    Prior Function Level of Independence:  Independent with assistive device(s)         Comments: pt reports she uses RW an quad cane to mobilize in home but does not always use them. she states she was ambulating without device when she had her fall. pt's son reports she does not use them regularly.      Hand Dominance        Extremity/Trunk Assessment   Upper Extremity Assessment Upper Extremity Assessment: Defer to OT evaluation;Generalized weakness;RUE deficits/detail RUE: Unable to fully assess due to immobilization    Lower Extremity Assessment Lower Extremity Assessment: Generalized weakness (will continue assessment)    Cervical / Trunk Assessment Cervical / Trunk Assessment: Kyphotic  Communication   Communication: HOH  Cognition Arousal/Alertness: Lethargic Behavior During Therapy: WFL for tasks assessed/performed Overall Cognitive Status: No family/caregiver present to determine baseline cognitive functioning Area of Impairment: Memory;Following commands;Orientation                 Orientation Level: Disoriented to;Time   Memory: Decreased short-term memory Following Commands: Follows one step commands with increased time       General Comments: Patient oriented to situation, place, self, not accurate on timeline of events. patient provided home setting and PLOF, seems accurate for most part based on discussion with son after evaluation.      General Comments      Exercises     Assessment/Plan    PT Assessment Patient needs continued PT services  PT Problem List Decreased strength;Decreased range of motion;Decreased activity tolerance;Decreased balance;Decreased mobility;Decreased coordination;Decreased cognition;Decreased knowledge of use of DME;Decreased safety awareness;Decreased knowledge of precautions;Pain       PT Treatment Interventions DME instruction;Gait training;Stair training;Functional mobility training;Therapeutic activities;Therapeutic exercise;Balance  training;Patient/family education    PT Goals (Current goals can be found in the Care Plan section)  Acute Rehab PT Goals Patient Stated Goal: regain independence PT Goal Formulation: With patient/family Time For Goal Achievement: 11/16/19 Potential to Achieve Goals: Fair    Frequency Min 2X/week    AM-PAC PT "6 Clicks" Mobility  Outcome Measure Help needed turning from your back to your side while in a flat bed without using bedrails?: A Lot Help needed moving from lying on your back to sitting on the side of a flat bed without using bedrails?: Total Help needed moving to and from a bed to a chair (including a wheelchair)?: Total Help needed standing up from a chair using your arms (e.g., wheelchair or bedside chair)?: Total Help needed to walk in hospital room?: Total Help needed climbing 3-5 steps with a railing? : Total 6 Click Score: 7    End of Session   Activity Tolerance: Patient limited by pain;Patient limited by lethargy Patient left: in bed;with call bell/phone within reach Nurse Communication: Mobility status PT Visit Diagnosis: Muscle weakness (generalized) (M62.81);Other abnormalities of gait and mobility (R26.89);Difficulty in walking, not elsewhere classified (R26.2);Pain Pain - Right/Left: Right Pain - part of body: Shoulder    Time: 1447-1520 PT Time Calculation (min) (ACUTE ONLY): 33 min   Charges:   PT Evaluation $PT Eval Moderate Complexity: 1 Mod PT Treatments $Therapeutic Activity: 8-22 mins       Wynn Maudlin, DPT Acute Rehabilitation Services  Office [phone] Pager [phone]  11/02/2019 5:53 PM

## 2019-11-02 NOTE — H&P (Signed)
History and Physical    Tami Vega XNA:[id number] DOB: July 19, 1940 DOA: 11/01/2019  PCP: Crist Fat, MD   Patient coming from: Home.  Chief Complaint: Fall.  HPI: Tami Vega is a 79 y.o. female with history of Parkinson's disease, hypothyroidism and a fall at home when patient felt dizzy.  As per the patient patient has been feeling dizzy for the last a few days particularly when she stands up from a sitting position.  Last evening patient fell and hit her head on the floor and has been hurting her right shoulder area.  Also has some neck pain.  Denies any other weakness.  Denies chest pain or shortness of breath.  ED Course: X-rays in the ER revealed right proximal humerus fracture for which ER physician placed patient on sling.  CT head is unremarkable CT neck is pending since patient was complaining of neck pain.  EKG shows normal sinus rhythm complete metabolic panel CBC unremarkable.  Covid test negative.  Patient admitted for dizziness and right proximal humerus fracture.  Review of Systems: As per HPI, rest all negative.   Past Medical History:  Diagnosis Date  . Abnormal involuntary movements(781.0)   . Disorder of bone and cartilage, unspecified   . Elevated blood pressure reading without diagnosis of hypertension   . Major depressive disorder, single episode, unspecified   . Other musculoskeletal symptoms referable to limbs(729.89)   . Pain in joint, lower leg   . Pure hypercholesterolemia     Past Surgical History:  Procedure Laterality Date  . BREAST SURGERY  1980's   benign cyst  . COLONOSCOPY  1995, 1998  . VAGINAL HYSTERECTOMY  1975     reports that she has never smoked. She has never used smokeless tobacco. She reports that she does not drink alcohol and does not use drugs.  No Known Allergies  Family History  Problem Relation Age of Onset  . CAD Mother   . Breast cancer Mother   . CAD Father   . CAD Brother   . Breast cancer Maternal Aunt   .  Colon cancer Maternal Aunt   . Hyperlipidemia Sister   . Hypertension Sister     Prior to Admission medications   Medication Sig Start Date End Date Taking? Authorizing Provider  b complex vitamins tablet Take 1 tablet by mouth daily.   Yes [provider]  Calcium Carbonate-Vit D-Min (CALCIUM 1200 PO) Take by mouth daily. Take one tablet a day   Yes [provider]  carbidopa-levodopa (SINEMET IR) 25-100 MG tablet Take 1 tablet by mouth 5 (five) times daily. Take at  7 AM, 10 AM, 1 PM, 4 PM and  7 PM 10/10/19  Yes Huston Foley, MD  entacapone (COMTAN) 200 MG tablet Take 1 tablet (200 mg total) by mouth 3 (three) times daily. Take with Sinemet dose at 7 AM, 1 PM and 7 PM. 10/10/19  Yes Huston Foley, MD  escitalopram (LEXAPRO) 10 MG tablet Take 10 mg by mouth daily. 01/31/17  Yes [provider]  fish oil-omega-3 fatty acids 1000 MG capsule Take 2 g by mouth daily.   Yes [provider]  levothyroxine (SYNTHROID) 88 MCG tablet Take 88 mcg by mouth daily before breakfast.   Yes [provider]  melatonin 5 MG TABS Take 5 mg by mouth at bedtime as needed (sleep).    Yes [provider]  Multiple Vitamin (MULTIVITAMIN WITH MINERALS) TABS tablet Take 1 tablet by mouth daily.  Yes [provider]  polyethylene glycol (MIRALAX / GLYCOLAX) 17 g packet Take 17 g by mouth daily as needed.   Yes [provider]  rasagiline (AZILECT) 1 MG TABS tablet Take 1 tablet (1 mg total) by mouth daily. Patient not taking: Reported on 11/02/2019 12/07/18   Huston Foley, MD    Physical Exam: Constitutional: Moderately built and nourished. Vitals:   11/01/19 2249 11/01/19 2307 11/02/19 0100 11/02/19 0230  BP:  (!) 146/80 (!) 144/77 118/67  Pulse:  85 81 71  Resp:  (!) 25 (!) 25 13  Temp:  98.5 F (36.9 C)    SpO2: 96% 93% 96% 96%   Eyes: Anicteric no pallor. ENMT: No discharge from the ears eyes nose or mouth. Neck: No mass felt.  No neck  rigidity. Respiratory: No rhonchi or crepitations. Cardiovascular: S1-S2 heard. Abdomen: Soft nontender bowel sounds present. Musculoskeletal: Right shoulder is in sling. Skin: No rash. Neurologic: Alert awake oriented to time place and person.  Moves all extremities. Psychiatric: Appears normal.  Normal affect.   Labs on Admission: I have personally reviewed following labs and imaging studies  CBC: Recent Labs  Lab 11/01/19 2354  WBC 5.8  NEUTROABS 2.8  HGB 12.5  HCT 36.1  MCV 89.4  PLT 159   Basic Metabolic Panel: Recent Labs  Lab 11/01/19 2354  NA 136  K 3.6  CL 105  CO2 24  GLUCOSE 113*  BUN 33*  CREATININE 0.90  CALCIUM 9.0   GFR: CrCl cannot be calculated (Unknown ideal weight.). Liver Function Tests: Recent Labs  Lab 11/01/19 2354  AST 11*  ALT 9  ALKPHOS 47  BILITOT 1.3*  PROT 6.7  ALBUMIN 4.2   No results for input(s): LIPASE, AMYLASE in the last 168 hours. No results for input(s): AMMONIA in the last 168 hours. Coagulation Profile: No results for input(s): INR, PROTIME in the last 168 hours. Cardiac Enzymes: No results for input(s): CKTOTAL, CKMB, CKMBINDEX, TROPONINI in the last 168 hours. BNP (last 3 results) No results for input(s): PROBNP in the last 8760 hours. HbA1C: No results for input(s): HGBA1C in the last 72 hours. CBG: No results for input(s): GLUCAP in the last 168 hours. Lipid Profile: No results for input(s): CHOL, HDL, LDLCALC, TRIG, CHOLHDL, LDLDIRECT in the last 72 hours. Thyroid Function Tests: No results for input(s): TSH, T4TOTAL, FREET4, T3FREE, THYROIDAB in the last 72 hours. Anemia Panel: No results for input(s): VITAMINB12, FOLATE, FERRITIN, TIBC, IRON, RETICCTPCT in the last 72 hours. Urine analysis: No results found for: COLORURINE, APPEARANCEUR, LABSPEC, PHURINE, GLUCOSEU, HGBUR, BILIRUBINUR, KETONESUR, PROTEINUR, UROBILINOGEN, NITRITE, LEUKOCYTESUR Sepsis  Labs: @LABRCNTIP (procalcitonin:4,lacticidven:4) ) Recent Results (from the past 240 hour(s))  SARS Coronavirus 2 by RT PCR (hospital order, performed in [facility] hospital lab) Nasopharyngeal Nasopharyngeal Swab     Status: None   Collection Time: 11/02/19  1:30 AM   Specimen: Nasopharyngeal Swab  Result Value Ref Range Status   SARS Coronavirus 2 NEGATIVE NEGATIVE Final    Comment: (NOTE) SARS-CoV-2 target nucleic acids are NOT DETECTED.  The SARS-CoV-2 RNA is generally detectable in upper and lower respiratory specimens during the acute phase of infection. The lowest concentration of SARS-CoV-2 viral copies this assay can detect is 250 copies / mL. A negative result does not preclude SARS-CoV-2 infection and should not be used as the sole basis for treatment or other patient management decisions.  A negative result may occur with improper specimen collection / handling, submission of specimen other than nasopharyngeal  swab, presence of viral mutation(s) within the areas targeted by this assay, and inadequate number of viral copies (<250 copies / mL). A negative result must be combined with clinical observations, patient history, and epidemiological information.  Fact Sheet for Patients:   BoilerBrush.com.cyhttps://www.fda.gov/media/136312/download  Fact Sheet for Healthcare Providers: https://pope.com/https://www.fda.gov/media/136313/download  This test is not yet approved or  cleared by the [place] FDA and has been authorized for detection and/or diagnosis of SARS-CoV-2 by FDA under an Emergency Use Authorization (EUA).  This EUA will remain in effect (meaning this test can be used) for the duration of the COVID-19 declaration under Section 564(b)(1) of the Act, 21 U.S.C. section 360bbb-3(b)(1), unless the authorization is terminated or revoked sooner.  Performed at [facility], [address] [place], [place] [address]      Radiological Exams on Admission: CT Head Wo  Contrast  Result Date: 11/02/2019 CLINICAL DATA:  Head trauma fall EXAM: CT HEAD WITHOUT CONTRAST TECHNIQUE: Contiguous axial images were obtained from the base of the skull through the vertex without intravenous contrast. COMPARISON:  Brain MRI 06/28/2006 FINDINGS: Brain: No acute territorial infarction, hemorrhage or intracranial mass. Mild atrophy. Nonenlarged ventricles. Vascular: No hyperdense vessels.  Carotid vascular calcification. Skull: Normal. Negative for fracture or focal lesion. Sinuses/Orbits: Mucosal thickening in the maxillary sinuses. Partially calcified soft tissue density or small mass at the right superior nasal passage. Other: None IMPRESSION: 1. No CT evidence for acute intracranial abnormality. 2. Atrophy. Electronically Signed   By: Jasmine PangKim  Fujinaga M.D.   On: 11/02/2019 01:58   DG Chest Portable 1 View  Result Date: 11/01/2019 CLINICAL DATA:  Fall with right shoulder pain EXAM: PORTABLE CHEST 1 VIEW COMPARISON:  09/26/2018 FINDINGS: Low lung volumes with strandy atelectasis and or scarring at the bases. No consolidation or effusion. Normal cardiomediastinal silhouette with aortic atherosclerosis. No pneumothorax. Incompletely visualized proximal right humerus fracture. IMPRESSION: 1. Low lung volumes with strandy atelectasis and or scarring at the bases. 2. Incompletely visualized proximal right humerus fracture. Electronically Signed   By: Jasmine PangKim  Fujinaga M.D.   On: 11/01/2019 23:36   DG Shoulder Right Portable  Result Date: 11/01/2019 CLINICAL DATA:  Fall, right shoulder pain EXAM: PORTABLE RIGHT SHOULDER COMPARISON:  Chest radiograph 09/26/2018 FINDINGS: Mildly comminuted fracture of the proximal right humerus predominantly extending through the surgical neck with extension into the greater and lesser tuberosities. Associated soft tissue swelling and probable shoulder effusion is present. No other acute osseous injury. The humeral head remains normally located however.  Acromioclavicular alignment is maintained. No other acute osseous injury. Remote right rib fractures are similar to comparison radiographs. No acute abnormality in the right chest. IMPRESSION: Mildly comminuted fracture of the proximal right humerus predominantly extending through the surgical neck with extension into the greater and lesser tuberosities. Right rib fractures appear largely healed, remote. Electronically Signed   By: Kreg ShropshirePrice  DeHay M.D.   On: 11/01/2019 23:37   DG Humerus Right  Result Date: 11/01/2019 CLINICAL DATA:  Status post fall. EXAM: RIGHT HUMERUS - 2+ VIEW COMPARISON:  None. FINDINGS: Acute, mildly impacted fracture deformity is seen involving the head and surgical neck of the proximal right humerus. There is no evidence of dislocation. Soft tissues are unremarkable. IMPRESSION: Acute fracture of the proximal right humerus. Electronically Signed   By: Aram Candelahaddeus  Houston M.D.   On: 11/01/2019 23:36    EKG: Independently reviewed.  Normal sinus rhythm.  Assessment/Plan Principal Problem:   Dizziness Active Problems:   Parkinson's disease (HCC)  Proximal humerus fracture   Hypothyroidism    1. Dizziness -suspect likely patient has orthostatic hypotension likely related to patient's Parkinson's disease.  Will monitor in telemetry.  Get physical therapy consult.  Check orthostatics in the morning. 2. Right humerus fracture presently on sling pain relief medication may discuss with orthopedics in the morning. 3. Parkinson's disease we will continue present home medications. 4. Hypothyroidism on Synthroid.   DVT prophylaxis: Heparin. Code Status: Full code. Family Communication: Patient's son. Disposition Plan: To be determined. Consults called: Physical therapy. Admission status: Observation.   Eduard Clos MD [facility] Pager [phone].  If 7PM-7AM, please contact night-coverage [email or web address] Password [email or web address]  11/02/2019, 3:35 AM

## 2019-11-02 NOTE — Progress Notes (Signed)
Patient seen and examined in the ER admitted for fall this morning suffering from right humerus fracture. During my examination she reported she has been feeling dehydrated due to poor appetite and fell at home without obvious loss of consciousness.  Vital signs are stable on physical exam limited range of mobility of her right arm rest of extremities appear okay.  Spoke with orthopedic, Dr. Yevette Edwards who reviewed the x-rays recommending sling for now and outpatient follow-up. I will check vitamin D levels, TSH. PT/OT ordered. Oxycodone ordered for pain control along with bowel regimen. IV fentanyl if necessary.  Patient's son, Viviann Spare, has been updated by me.  Please call with further questions as needed  Time spent-15 minutes  Stephania Fragmin MD [facility]

## 2019-11-02 NOTE — ED Notes (Signed)
2nd call received from pt son Mareta Chesnut [phone] requesting rtn call for pt status/updates when possible. [employer]

## 2019-11-02 NOTE — Consult Note (Signed)
Reason for Consult: R arm px Referring Physician: Dr Nelson Chimes  Tami Vega is an 79 y.o. female.  HPI: She had a dizzy spell and fell at home striking her head and R shoulder. She denies LOC. She reports ongoing R arm and shoulder pain and was found to have a R proximal humerus fx. She has been placed in a sling and does say it feels some better and pain is controlled. She is being worked up additionally by medicine team for potential admit for other pathology. She has no hx of R arm pathology. She states she has not had much of an appetite recently and she was dehydrated and has parkinsons and she believes that is why she fell.    Past Medical History:  Diagnosis Date  . Abnormal involuntary movements(781.0)   . Disorder of bone and cartilage, unspecified   . Elevated blood pressure reading without diagnosis of hypertension   . Major depressive disorder, single episode, unspecified   . Other musculoskeletal symptoms referable to limbs(729.89)   . Pain in joint, lower leg   . Pure hypercholesterolemia     Past Surgical History:  Procedure Laterality Date  . BREAST SURGERY  1980's   benign cyst  . COLONOSCOPY  1995, 1998  . VAGINAL HYSTERECTOMY  1975    Family History  Problem Relation Age of Onset  . CAD Mother   . Breast cancer Mother   . CAD Father   . CAD Brother   . Breast cancer Maternal Aunt   . Colon cancer Maternal Aunt   . Hyperlipidemia Sister   . Hypertension Sister     Social History:  reports that she has never smoked. She has never used smokeless tobacco. She reports that she does not drink alcohol and does not use drugs.  Allergies: No Known Allergies  Medications: Current Facility-Administered Medications:  .  0.9 %  sodium chloride infusion, , Intravenous, Continuous, Amin, Ankit Chirag, MD .  carbidopa-levodopa (SINEMET IR) 25-100 MG per tablet immediate release 1 tablet, 1 tablet, Oral, 5 times per day, Eduard Clos, MD, 1 tablet at 11/02/19  1140 .  entacapone (COMTAN) tablet 200 mg, 200 mg, Oral, 3 times per day, Eduard Clos, MD, 200 mg at 11/02/19 0553 .  escitalopram (LEXAPRO) tablet 10 mg, 10 mg, Oral, Daily, Eduard Clos, MD, 10 mg at 11/02/19 1141 .  fentaNYL (SUBLIMAZE) injection 12.5 mcg, 12.5 mcg, Intravenous, Q4H PRN, Eduard Clos, MD, 12.5 mcg at 11/02/19 0437 .  heparin injection 5,000 Units, 5,000 Units, Subcutaneous, Q8H, Eduard Clos, MD, 5,000 Units at 11/02/19 0555 .  levothyroxine (SYNTHROID) tablet 88 mcg, 88 mcg, Oral, Q0600, Eduard Clos, MD, 88 mcg at 11/02/19 0553 .  melatonin tablet 5 mg, 5 mg, Oral, QHS PRN, Eduard Clos, MD .  multivitamin with minerals tablet 1 tablet, 1 tablet, Oral, Daily, Eduard Clos, MD, 1 tablet at 11/02/19 1141 .  omega-3 acid ethyl esters (LOVAZA) capsule 1 g, 1 g, Oral, BID, Eduard Clos, MD, 1 g at 11/02/19 1141 .  ondansetron (ZOFRAN) tablet 4 mg, 4 mg, Oral, Q6H PRN **OR** ondansetron (ZOFRAN) injection 4 mg, 4 mg, Intravenous, Q6H PRN, Eduard Clos, MD .  oxyCODONE (Oxy IR/ROXICODONE) immediate release tablet 5 mg, 5 mg, Oral, Q4H PRN, Amin, Ankit Chirag, MD .  polyethylene glycol (MIRALAX / GLYCOLAX) packet 17 g, 17 g, Oral, Daily PRN, Amin, Ankit Chirag, MD .  senna-docusate (Senokot-S) tablet 2 tablet, 2 tablet,  Oral, QHS PRN, Amin, Ankit Chirag, MD  Current Outpatient Medications:  .  b complex vitamins tablet, Take 1 tablet by mouth daily., Disp: , Rfl:  .  Calcium Carbonate-Vit D-Min (CALCIUM 1200 PO), Take by mouth daily. Take one tablet a day, Disp: , Rfl:  .  carbidopa-levodopa (SINEMET IR) 25-100 MG tablet, Take 1 tablet by mouth 5 (five) times daily. Take at  7 AM, 10 AM, 1 PM, 4 PM and  7 PM, Disp: 450 tablet, Rfl: 3 .  entacapone (COMTAN) 200 MG tablet, Take 1 tablet (200 mg total) by mouth 3 (three) times daily. Take with Sinemet dose at 7 AM, 1 PM and 7 PM., Disp: 270 tablet, Rfl: 3 .   escitalopram (LEXAPRO) 10 MG tablet, Take 10 mg by mouth daily., Disp: , Rfl: 4 .  fish oil-omega-3 fatty acids 1000 MG capsule, Take 2 g by mouth daily., Disp: , Rfl:  .  levothyroxine (SYNTHROID) 88 MCG tablet, Take 88 mcg by mouth daily before breakfast., Disp: , Rfl:  .  melatonin 5 MG TABS, Take 5 mg by mouth at bedtime as needed (sleep). , Disp: , Rfl:  .  Multiple Vitamin (MULTIVITAMIN WITH MINERALS) TABS tablet, Take 1 tablet by mouth daily., Disp: , Rfl:  .  polyethylene glycol (MIRALAX / GLYCOLAX) 17 g packet, Take 17 g by mouth daily as needed., Disp: , Rfl:  .  rasagiline (AZILECT) 1 MG TABS tablet, Take 1 tablet (1 mg total) by mouth daily. (Patient not taking: Reported on 11/02/2019), Disp: 90 tablet, Rfl: 3  Results for orders placed or performed during the hospital encounter of 11/01/19 (from the past 48 hour(s))  CBC with Differential     Status: Abnormal   Collection Time: 11/01/19 11:54 PM  Result Value Ref Range   WBC 5.8 4.0 - 10.5 K/uL   RBC 4.04 3.87 - 5.11 MIL/uL   Hemoglobin 12.5 12.0 - 15.0 g/dL   HCT 77.8 36 - 46 %   MCV 89.4 80.0 - 100.0 fL   MCH 30.9 26.0 - 34.0 pg   MCHC 34.6 30.0 - 36.0 g/dL   RDW 24.2 35.3 - 61.4 %   Platelets 159 150 - 400 K/uL   nRBC 0.0 0.0 - 0.2 %   Neutrophils Relative % 49 %   Neutro Abs 2.8 1.7 - 7.7 K/uL   Lymphocytes Relative 24 %   Lymphs Abs 1.4 0.7 - 4.0 K/uL   Monocytes Relative 23 %   Monocytes Absolute 1.3 (H) 0 - 1 K/uL   Eosinophils Relative 4 %   Eosinophils Absolute 0.2 0 - 0 K/uL   Basophils Relative 0 %   Basophils Absolute 0.0 0 - 0 K/uL   Immature Granulocytes 0 %   Abs Immature Granulocytes 0.02 0.00 - 0.07 K/uL    Comment: Performed at [facility], [address], [place], [place] [address]  Comprehensive metabolic panel     Status: Abnormal   Collection Time: 11/01/19 11:54 PM  Result Value Ref Range   Sodium 136 135 - 145 mmol/L   Potassium 3.6 3.5 - 5.1 mmol/L   Chloride 105 98 - 111  mmol/L   CO2 24 22 - 32 mmol/L   Glucose, Bld 113 (H) 70 - 99 mg/dL    Comment: Glucose reference range applies only to samples taken after fasting for at least 8 hours.   BUN 33 (H) 8 - 23 mg/dL   Creatinine, Ser 0.08 0.44 - 1.00 mg/dL  Calcium 9.0 8.9 - 10.3 mg/dL   Total Protein 6.7 6.5 - 8.1 g/dL   Albumin 4.2 3.5 - 5.0 g/dL   AST 11 (L) 15 - 41 U/L   ALT 9 0 - 44 U/L   Alkaline Phosphatase 47 38 - 126 U/L   Total Bilirubin 1.3 (H) 0.3 - 1.2 mg/dL   GFR calc non Af Amer >60 >60 mL/min   GFR calc Af Amer >60 >60 mL/min   Anion gap 7 5 - 15    Comment: Performed at [facility], [address] [place], [place] [address]  SARS Coronavirus 2 by RT PCR (hospital order, performed in [facility] hospital lab) Nasopharyngeal Nasopharyngeal Swab     Status: None   Collection Time: 11/02/19  1:30 AM   Specimen: Nasopharyngeal Swab  Result Value Ref Range   SARS Coronavirus 2 NEGATIVE NEGATIVE    Comment: (NOTE) SARS-CoV-2 target nucleic acids are NOT DETECTED.  The SARS-CoV-2 RNA is generally detectable in upper and lower respiratory specimens during the acute phase of infection. The lowest concentration of SARS-CoV-2 viral copies this assay can detect is 250 copies / mL. A negative result does not preclude SARS-CoV-2 infection and should not be used as the sole basis for treatment or other patient management decisions.  A negative result may occur with improper specimen collection / handling, submission of specimen other than nasopharyngeal swab, presence of viral mutation(s) within the areas targeted by this assay, and inadequate number of viral copies (<250 copies / mL). A negative result must be combined with clinical observations, patient history, and epidemiological information.  Fact Sheet for Patients:   BoilerBrush.com.cyhttps://www.fda.gov/media/136312/download  Fact Sheet for Healthcare Providers: https://pope.com/https://www.fda.gov/media/136313/download  This test is not yet  approved or  cleared by the [place] FDA and has been authorized for detection and/or diagnosis of SARS-CoV-2 by FDA under an Emergency Use Authorization (EUA).  This EUA will remain in effect (meaning this test can be used) for the duration of the COVID-19 declaration under Section 564(b)(1) of the Act, 21 U.S.C. section 360bbb-3(b)(1), unless the authorization is terminated or revoked sooner.  Performed at [facility], [address] [place], [place] [address]   Basic metabolic panel     Status: Abnormal   Collection Time: 11/02/19  4:39 AM  Result Value Ref Range   Sodium 135 135 - 145 mmol/L   Potassium 3.1 (L) 3.5 - 5.1 mmol/L   Chloride 108 98 - 111 mmol/L   CO2 19 (L) 22 - 32 mmol/L   Glucose, Bld 108 (H) 70 - 99 mg/dL    Comment: Glucose reference range applies only to samples taken after fasting for at least 8 hours.   BUN 29 (H) 8 - 23 mg/dL   Creatinine, Ser 4.030.59 0.44 - 1.00 mg/dL   Calcium 7.3 (L) 8.9 - 10.3 mg/dL   GFR calc non Af Amer >60 >60 mL/min   GFR calc Af Amer >60 >60 mL/min   Anion gap 8 5 - 15    Comment: Performed at [facility], [address] [place], [place] [address]  CBC     Status: Abnormal   Collection Time: 11/02/19  4:39 AM  Result Value Ref Range   WBC 7.7 4.0 - 10.5 K/uL   RBC 3.31 (L) 3.87 - 5.11 MIL/uL   Hemoglobin 10.2 (L) 12.0 - 15.0 g/dL   HCT 95.630.2 (L) 36 - 46 %   MCV 91.2 80.0 - 100.0 fL  MCH 30.8 26.0 - 34.0 pg   MCHC 33.8 30.0 - 36.0 g/dL   RDW 16.1 09.6 - 04.5 %   Platelets 156 150 - 400 K/uL   nRBC 0.0 0.0 - 0.2 %    Comment: Performed at [facility], [address], [place], [place] [address]  Urinalysis, Routine w reflex microscopic Urine, Catheterized     Status: Abnormal   Collection Time: 11/02/19 10:39 AM  Result Value Ref Range   Color, Urine AMBER (A) YELLOW    Comment: BIOCHEMICALS MAY BE AFFECTED BY COLOR   APPearance CLEAR CLEAR   Specific  Gravity, Urine 1.014 1.005 - 1.030   pH 6.0 5.0 - 8.0   Glucose, UA NEGATIVE NEGATIVE mg/dL   Hgb urine dipstick SMALL (A) NEGATIVE   Bilirubin Urine NEGATIVE NEGATIVE   Ketones, ur 5 (A) NEGATIVE mg/dL   Protein, ur NEGATIVE NEGATIVE mg/dL   Nitrite NEGATIVE NEGATIVE   Leukocytes,Ua TRACE (A) NEGATIVE   RBC / HPF 0-5 0 - 5 RBC/hpf   WBC, UA 0-5 0 - 5 WBC/hpf   Bacteria, UA RARE (A) NONE SEEN   Squamous Epithelial / LPF 0-5 0 - 5    Comment: Performed at [facility], [address], [place], [place] [address]    CT Head Wo Contrast  Result Date: 11/02/2019 CLINICAL DATA:  Head trauma fall EXAM: CT HEAD WITHOUT CONTRAST TECHNIQUE: Contiguous axial images were obtained from the base of the skull through the vertex without intravenous contrast. COMPARISON:  Brain MRI 06/28/2006 FINDINGS: Brain: No acute territorial infarction, hemorrhage or intracranial mass. Mild atrophy. Nonenlarged ventricles. Vascular: No hyperdense vessels.  Carotid vascular calcification. Skull: Normal. Negative for fracture or focal lesion. Sinuses/Orbits: Mucosal thickening in the maxillary sinuses. Partially calcified soft tissue density or small mass at the right superior nasal passage. Other: None IMPRESSION: 1. No CT evidence for acute intracranial abnormality. 2. Atrophy. Electronically Signed   By: Jasmine Pang M.D.   On: 11/02/2019 01:58   CT CERVICAL SPINE WO CONTRAST  Result Date: 11/02/2019 CLINICAL DATA:  Acute neck pain after a fall at home EXAM: CT CERVICAL SPINE WITHOUT CONTRAST TECHNIQUE: Multidetector CT imaging of the cervical spine was performed without intravenous contrast. Multiplanar CT image reconstructions were also generated. COMPARISON:  None. FINDINGS: Alignment: Normal. Skull base and vertebrae: No acute fracture. No primary bone lesion or focal pathologic process. Soft tissues and spinal canal: No prevertebral fluid or swelling. No visible canal hematoma. Disc levels:  Degenerative changes with narrowed interspaces and endplate hypertrophic changes most prominent at C5-6, C6-7, and T1-2 levels. Degenerative changes in the facet joints. Upper chest: Suggestion of a diffuse interstitial pattern in the lung apices, possibly edema or pneumonitis. Other: None. IMPRESSION: 1. No acute displaced fractures identified in the cervical spine. Degenerative changes. 2. Suggestion of a diffuse interstitial pattern in the lung apices, possibly edema or pneumonitis. Electronically Signed   By: Burman Nieves M.D.   On: 11/02/2019 04:05   DG Chest Portable 1 View  Result Date: 11/01/2019 CLINICAL DATA:  Fall with right shoulder pain EXAM: PORTABLE CHEST 1 VIEW COMPARISON:  09/26/2018 FINDINGS: Low lung volumes with strandy atelectasis and or scarring at the bases. No consolidation or effusion. Normal cardiomediastinal silhouette with aortic atherosclerosis. No pneumothorax. Incompletely visualized proximal right humerus fracture. IMPRESSION: 1. Low lung volumes with strandy atelectasis and or scarring at the bases. 2. Incompletely visualized proximal right humerus fracture. Electronically Signed   By: Jasmine Pang  M.D.   On: 11/01/2019 23:36   DG Shoulder Right Portable  Result Date: 11/01/2019 CLINICAL DATA:  Fall, right shoulder pain EXAM: PORTABLE RIGHT SHOULDER COMPARISON:  Chest radiograph 09/26/2018 FINDINGS: Mildly comminuted fracture of the proximal right humerus predominantly extending through the surgical neck with extension into the greater and lesser tuberosities. Associated soft tissue swelling and probable shoulder effusion is present. No other acute osseous injury. The humeral head remains normally located however. Acromioclavicular alignment is maintained. No other acute osseous injury. Remote right rib fractures are similar to comparison radiographs. No acute abnormality in the right chest. IMPRESSION: Mildly comminuted fracture of the proximal right humerus predominantly  extending through the surgical neck with extension into the greater and lesser tuberosities. Right rib fractures appear largely healed, remote. Electronically Signed   By: Kreg Shropshire M.D.   On: 11/01/2019 23:37   DG Humerus Right  Result Date: 11/01/2019 CLINICAL DATA:  Status post fall. EXAM: RIGHT HUMERUS - 2+ VIEW COMPARISON:  None. FINDINGS: Acute, mildly impacted fracture deformity is seen involving the head and surgical neck of the proximal right humerus. There is no evidence of dislocation. Soft tissues are unremarkable. IMPRESSION: Acute fracture of the proximal right humerus. Electronically Signed   By: Aram Candela M.D.   On: 11/01/2019 23:36    Review of Systems  Constitutional: Negative for chills, diaphoresis and fever.  HENT: Negative for congestion, ear discharge, facial swelling, sneezing and voice change.   Eyes: Negative for discharge and redness.  Respiratory: Negative for cough and shortness of breath.   Gastrointestinal: Negative for nausea and vomiting.  Musculoskeletal: Positive for neck pain and neck stiffness.  Skin: Negative for color change, pallor, rash and wound.  Neurological: Positive for dizziness. Negative for tremors, light-headedness and numbness.  Psychiatric/Behavioral: Negative for agitation and behavioral problems. The patient is not nervous/anxious.    Blood pressure (!) 111/94, pulse 80, temperature 98.5 F (36.9 C), temperature source Oral, resp. rate 15, SpO2 97 %. Physical Exam Constitutional:      General: She is not in acute distress.    Appearance: Normal appearance. She is not ill-appearing.  HENT:     Head: Normocephalic and atraumatic.     Right Ear: External ear normal.     Left Ear: External ear normal.     Nose: Nose normal.  Eyes:     Extraocular Movements: Extraocular movements intact.  Pulmonary:     Effort: Pulmonary effort is normal.  Musculoskeletal:        General: Tenderness present. No swelling or deformity.      Right shoulder: Tenderness and bony tenderness present. Decreased range of motion. Decreased strength.     Left shoulder: Normal.     Right upper arm: Tenderness and bony tenderness present.     Left upper arm: Normal.     Right elbow: Normal.     Left elbow: Normal.     Right wrist: Normal.     Left wrist: Normal.       Arms:     Cervical back: Normal range of motion. Tenderness present. No rigidity.  Skin:    General: Skin is warm and dry.     Capillary Refill: Capillary refill takes less than 2 seconds.     Findings: No bruising or erythema.  Neurological:     General: No focal deficit present.     Mental Status: She is alert and oriented to person, place, and time.  Psychiatric:  Mood and Affect: Mood normal.        Behavior: Behavior normal.        Thought Content: Thought content normal.     Assessment/Plan:  R proximal humerus fx involving the head and surgical neck, minimal impaction.   -To be managed non-op  -F/U outpatient with Dr Ave Filter [facility])   [phone]  -Sling already provided and to be continued until f/u   -Pain currently well controlled, continue current regimen  -Avoid use R shoulder, OK to utilize hand and elbow as tolerated   -No additional surveillance needed by ortho   Additional medical workup ongoing by medicine team  Eilene Ghazi Jahrel Borthwick 11/02/2019, 2:03 PM

## 2019-11-02 NOTE — ED Notes (Signed)
Pt states that she is still in pain. I informed her that we could not give her more pain medicine for 2 more hours. I gave her an ice pack to help decrease her pain.

## 2019-11-03 DIAGNOSIS — R42 Dizziness and giddiness: Secondary | ICD-10-CM | POA: Diagnosis not present

## 2019-11-03 LAB — MAGNESIUM: Magnesium: 2.3 mg/dL (ref 1.7–2.4)

## 2019-11-03 LAB — URINE CULTURE

## 2019-11-03 LAB — BASIC METABOLIC PANEL
Anion gap: 9 (ref 5–15)
BUN: 30 mg/dL — ABNORMAL HIGH (ref 8–23)
CO2: 24 mmol/L (ref 22–32)
Calcium: 8.7 mg/dL — ABNORMAL LOW (ref 8.9–10.3)
Chloride: 104 mmol/L (ref 98–111)
Creatinine, Ser: 0.66 mg/dL (ref 0.44–1.00)
GFR calc Af Amer: >60 mL/min (ref >60)
GFR calc non Af Amer: >60 mL/min (ref >60)
Glucose, Bld: 102 mg/dL — ABNORMAL HIGH (ref 70–99)
Potassium: 3.4 mmol/L — ABNORMAL LOW (ref 3.5–5.1)
Sodium: 137 mmol/L (ref 135–145)

## 2019-11-03 MED ORDER — VITAMIN D 25 MCG (1000 UNIT) PO TABS
1000.0000 [IU] | ORAL_TABLET | Freq: Every day | ORAL | Status: DC
  Administered 2019-11-03 – 2019-11-08 (×6): 1000 [IU] via ORAL
  Filled 2019-11-03 (×6): qty 1

## 2019-11-03 MED ORDER — POTASSIUM CHLORIDE CRYS ER 20 MEQ PO TBCR
40.0000 meq | EXTENDED_RELEASE_TABLET | Freq: Once | ORAL | Status: AC
  Administered 2019-11-03: 40 meq via ORAL
  Filled 2019-11-03: qty 2

## 2019-11-03 NOTE — [place] FL2 (Signed)
[place] MEDICAID FL2 LEVEL OF CARE SCREENING TOOL     IDENTIFICATION  Patient Name: Tami Vega Birthdate: 10-06-1940 Sex: female Admission Date (Current Location): 11/01/2019  [facility] and [place] Number:  Producer, television/film/video and Address:  [facility],  [address] [place], [place] [address]      Provider Number: [id number]  Attending Physician Name and Address:  Dimple Nanas, MD  Relative Name and Phone Number:  Nayelis Bonito, son, [phone]    Current Level of Care: Hospital Recommended Level of Care: Skilled Nursing Facility Prior Approval Number:    Date Approved/Denied:   PASRR Number:   [id number]  Discharge Plan: SNF    Current Diagnoses: Patient Active Problem List   Diagnosis Date Noted  . Dizziness 11/02/2019  . Parkinson's disease (HCC) 11/02/2019  . Proximal humerus fracture 11/02/2019  . Hypothyroidism 11/02/2019  . Chest discomfort 12/09/2012    Orientation RESPIRATION BLADDER Height & Weight     Self, Situation, Place  O2 (2L [place]) External catheter Weight:   Height:     BEHAVIORAL SYMPTOMS/MOOD NEUROLOGICAL BOWEL NUTRITION STATUS   (none)  (none) Continent Diet (see discharge summary)  AMBULATORY STATUS COMMUNICATION OF NEEDS Skin   Extensive Assist Verbally Normal                       Personal Care Assistance Level of Assistance  Bathing, Feeding, Dressing Bathing Assistance: Maximum assistance Feeding assistance: Independent Dressing Assistance: Maximum assistance     Functional Limitations Info  Sight, Hearing, Speech Sight Info: Adequate Hearing Info: Adequate Speech Info: Adequate    SPECIAL CARE FACTORS FREQUENCY  PT (By licensed PT), OT (By licensed OT)     PT Frequency: 5X/W OT Frequency: 5X/W            Contractures Contractures Info: Not present    Additional Factors Info  Code Status, Allergies Code Status Info: Full Allergies Info: NKA           Current Medications (11/03/2019):   This is the current hospital active medication list Current Facility-Administered Medications  Medication Dose Route Frequency Provider Last Rate Last Admin  . carbidopa-levodopa (SINEMET IR) 25-100 MG per tablet immediate release 1 tablet  1 tablet Oral 5 times per day Eduard Clos, MD   1 tablet at 11/03/19 1002  . cholecalciferol (VITAMIN D3) tablet 1,000 Units  1,000 Units Oral Daily Dimple Nanas, MD   1,000 Units at 11/03/19 1003  . entacapone (COMTAN) tablet 200 mg  200 mg Oral 3 times per day Eduard Clos, MD   200 mg at 11/03/19 1017  . escitalopram (LEXAPRO) tablet 10 mg  10 mg Oral Daily Eduard Clos, MD   10 mg at 11/03/19 1003  . heparin injection 5,000 Units  5,000 Units Subcutaneous Q8H Eduard Clos, MD   5,000 Units at 11/03/19 0459  . levothyroxine (SYNTHROID) tablet 88 mcg  88 mcg Oral Q0600 Eduard Clos, MD   88 mcg at 11/03/19 5102  . melatonin tablet 5 mg  5 mg Oral QHS PRN Eduard Clos, MD   5 mg at 11/02/19 2027  . multivitamin with minerals tablet 1 tablet  1 tablet Oral Daily Eduard Clos, MD   1 tablet at 11/03/19 1003  . omega-3 acid ethyl esters (LOVAZA) capsule 1 g  1 g Oral BID Eduard Clos, MD   1 g at 11/03/19 1003  . ondansetron (ZOFRAN)  tablet 4 mg  4 mg Oral Q6H PRN Eduard Clos, MD       Or  . ondansetron [facility]) injection 4 mg  4 mg Intravenous Q6H PRN Eduard Clos, MD      . oxyCODONE (Oxy IR/ROXICODONE) immediate release tablet 5 mg  5 mg Oral Q4H PRN Dimple Nanas, MD   5 mg at 11/03/19 0459  . polyethylene glycol (MIRALAX / GLYCOLAX) packet 17 g  17 g Oral Daily PRN Amin, Ankit Chirag, MD      . senna-docusate (Senokot-S) tablet 2 tablet  2 tablet Oral QHS PRN Amin, Loura Halt, MD         Discharge Medications: Please see discharge summary for a list of discharge medications.  Relevant Imaging Results:  Relevant Lab Results:   Additional Information 242  [phone] [place], [place]

## 2019-11-03 NOTE — Progress Notes (Signed)
PROGRESS NOTE    Tami Vega  GGY:[id number] DOB: 11-03-1940 DOA: 11/01/2019 PCP: Crist Fat, MD   Brief Narrative:  79 y.o. female with history of Parkinson's disease, hypothyroidism and a fall at home when patient felt dizzy.  As per the patient patient has been feeling dizzy for the last a few days particularly when she stands up from a sitting position. X-rays in the ER revealed right proximal humerus fracture for which ER physician placed patient on sling.  CT head is unremarkable CT neck is pending since patient was complaining of neck pain.  EKG shows normal sinus rhythm complete metabolic panel CBC unremarkable.  Covid test negative.  Patient admitted for dizziness and right proximal humerus fracture. Seen by ortho, recommends conservative management.    Assessment & Plan:   Principal Problem:   Dizziness Active Problems:   Parkinson's disease (HCC)   Proximal humerus fracture   Hypothyroidism   Dizziness/Presyncope without LOC Right Humerus Fracture  -Patient appears to be well-hydrated now.  We will go to stop IV fluids as she is tolerating orals -PT recommended SNF, TOC team consulted -Seen by orthopedic, nonoperative management.  Sling in place.  Outpatient follow-up with Dr. Ave Filter -Supportive care  Parkinson's Disease  -Continue home meds  Hypothyroidism -TSH within normal limits.  Continue Synthroid  Vitamin D deficiency -Vitamin D supplement started   DVT prophylaxis: Heparin Code Status: Full  Family Communication:  Spoke with her Doristine Locks    Dispo: The patient is from: Home              Anticipated d/c is to: SNF              Anticipated d/c date is: 1 day              Patient currently is medically stable to d/c.  PT recommends SNF, TOC team made aware for placement.       There is no height or weight on file to calculate BMI.    Subjective: Patient sitting in the chair, no complaints besides slight mild right shoulder pain as  expected from the fracture.  She is agreeable to go to SNF.  Review of Systems Otherwise negative except as per HPI, including: General: Denies fever, chills, night sweats or unintended weight loss. Resp: Denies cough, wheezing, shortness of breath. Cardiac: Denies chest pain, palpitations, orthopnea, paroxysmal nocturnal dyspnea. GI: Denies abdominal pain, nausea, vomiting, diarrhea or constipation GU: Denies dysuria, frequency, hesitancy or incontinence MS: Denies myalgias Neuro: Denies headache, neurologic deficits (focal weakness, numbness, tingling), abnormal gait Psych: Denies anxiety, depression, SI/HI/AVH Skin: Denies new rashes or lesions ID: Denies sick contacts, exotic exposures, travel  Examination:  General exam: Appears calm and comfortable  Respiratory system: Mild bibasilar crackles Cardiovascular system: S1 & S2 heard, RRR. No JVD, murmurs, rubs, gallops or clicks. No pedal edema. Gastrointestinal system: Abdomen is nondistended, soft and nontender. No organomegaly or masses felt. Normal bowel sounds heard. Central nervous system: Alert and oriented. No focal neurological deficits. Extremities: Right shoulder sling noted Skin: No rashes, lesions or ulcers Psychiatry: Judgement and insight appear normal. Mood & affect appropriate.     Objective: Vitals:   11/02/19 2020 11/03/19 0005 11/03/19 0408 11/03/19 0855  BP: 116/65 113/64 128/65 (!) 110/57  Pulse: 79 75 68 72  Resp: 17 20 20 16   Temp: 98.3 F (36.8 C) 98 F (36.7 C) 98.1 F (36.7 C) 98.3 F (36.8 C)  TempSrc:      SpO2:  93% 94% 95% 94%    Intake/Output Summary (Last 24 hours) at 11/03/2019 0946 Last data filed at 11/02/2019 1829 Gross per 24 hour  Intake 380.04 ml  Output --  Net 380.04 ml   There were no vitals filed for this visit.   Data Reviewed:   CBC: Recent Labs  Lab 11/01/19 2354 11/02/19 0439  WBC 5.8 7.7  NEUTROABS 2.8  --   HGB 12.5 10.2*  HCT 36.1 30.2*  MCV 89.4 91.2   PLT 159 156   Basic Metabolic Panel: Recent Labs  Lab 11/01/19 2354 11/02/19 0439 11/03/19 0322  NA 136 135 137  K 3.6 3.1* 3.4*  CL 105 108 104  CO2 24 19* 24  GLUCOSE 113* 108* 102*  BUN 33* 29* 30*  CREATININE 0.90 0.59 0.66  CALCIUM 9.0 7.3* 8.7*  MG  --   --  2.3   GFR: CrCl cannot be calculated (Unknown ideal weight.). Liver Function Tests: Recent Labs  Lab 11/01/19 2354  AST 11*  ALT 9  ALKPHOS 47  BILITOT 1.3*  PROT 6.7  ALBUMIN 4.2   No results for input(s): LIPASE, AMYLASE in the last 168 hours. No results for input(s): AMMONIA in the last 168 hours. Coagulation Profile: No results for input(s): INR, PROTIME in the last 168 hours. Cardiac Enzymes: No results for input(s): CKTOTAL, CKMB, CKMBINDEX, TROPONINI in the last 168 hours. BNP (last 3 results) No results for input(s): PROBNP in the last 8760 hours. HbA1C: No results for input(s): HGBA1C in the last 72 hours. CBG: No results for input(s): GLUCAP in the last 168 hours. Lipid Profile: No results for input(s): CHOL, HDL, LDLCALC, TRIG, CHOLHDL, LDLDIRECT in the last 72 hours. Thyroid Function Tests: Recent Labs    11/02/19 1655  TSH 1.608   Anemia Panel: No results for input(s): VITAMINB12, FOLATE, FERRITIN, TIBC, IRON, RETICCTPCT in the last 72 hours. Sepsis Labs: No results for input(s): PROCALCITON, LATICACIDVEN in the last 168 hours.  Recent Results (from the past 240 hour(s))  SARS Coronavirus 2 by RT PCR (hospital order, performed in [facility] hospital lab) Nasopharyngeal Nasopharyngeal Swab     Status: None   Collection Time: 11/02/19  1:30 AM   Specimen: Nasopharyngeal Swab  Result Value Ref Range Status   SARS Coronavirus 2 NEGATIVE NEGATIVE Final    Comment: (NOTE) SARS-CoV-2 target nucleic acids are NOT DETECTED.  The SARS-CoV-2 RNA is generally detectable in upper and lower respiratory specimens during the acute phase of infection. The lowest concentration of SARS-CoV-2  viral copies this assay can detect is 250 copies / mL. A negative result does not preclude SARS-CoV-2 infection and should not be used as the sole basis for treatment or other patient management decisions.  A negative result may occur with improper specimen collection / handling, submission of specimen other than nasopharyngeal swab, presence of viral mutation(s) within the areas targeted by this assay, and inadequate number of viral copies (<250 copies / mL). A negative result must be combined with clinical observations, patient history, and epidemiological information.  Fact Sheet for Patients:   BoilerBrush.com.cy  Fact Sheet for Healthcare Providers: https://pope.com/  This test is not yet approved or  cleared by the [place] FDA and has been authorized for detection and/or diagnosis of SARS-CoV-2 by FDA under an Emergency Use Authorization (EUA).  This EUA will remain in effect (meaning this test can be used) for the duration of the COVID-19 declaration under Section 564(b)(1) of the Act, 21 U.S.C. section  360bbb-3(b)(1), unless the authorization is terminated or revoked sooner.  Performed at [facility], [address], [place], [place] [address]          Radiology Studies: CT Head Wo Contrast  Result Date: 11/02/2019 CLINICAL DATA:  Head trauma fall EXAM: CT HEAD WITHOUT CONTRAST TECHNIQUE: Contiguous axial images were obtained from the base of the skull through the vertex without intravenous contrast. COMPARISON:  Brain MRI 06/28/2006 FINDINGS: Brain: No acute territorial infarction, hemorrhage or intracranial mass. Mild atrophy. Nonenlarged ventricles. Vascular: No hyperdense vessels.  Carotid vascular calcification. Skull: Normal. Negative for fracture or focal lesion. Sinuses/Orbits: Mucosal thickening in the maxillary sinuses. Partially calcified soft tissue density or small mass at the right  superior nasal passage. Other: None IMPRESSION: 1. No CT evidence for acute intracranial abnormality. 2. Atrophy. Electronically Signed   By: Jasmine Pang M.D.   On: 11/02/2019 01:58   CT CERVICAL SPINE WO CONTRAST  Result Date: 11/02/2019 CLINICAL DATA:  Acute neck pain after a fall at home EXAM: CT CERVICAL SPINE WITHOUT CONTRAST TECHNIQUE: Multidetector CT imaging of the cervical spine was performed without intravenous contrast. Multiplanar CT image reconstructions were also generated. COMPARISON:  None. FINDINGS: Alignment: Normal. Skull base and vertebrae: No acute fracture. No primary bone lesion or focal pathologic process. Soft tissues and spinal canal: No prevertebral fluid or swelling. No visible canal hematoma. Disc levels: Degenerative changes with narrowed interspaces and endplate hypertrophic changes most prominent at C5-6, C6-7, and T1-2 levels. Degenerative changes in the facet joints. Upper chest: Suggestion of a diffuse interstitial pattern in the lung apices, possibly edema or pneumonitis. Other: None. IMPRESSION: 1. No acute displaced fractures identified in the cervical spine. Degenerative changes. 2. Suggestion of a diffuse interstitial pattern in the lung apices, possibly edema or pneumonitis. Electronically Signed   By: Burman Nieves M.D.   On: 11/02/2019 04:05   DG Chest Portable 1 View  Result Date: 11/01/2019 CLINICAL DATA:  Fall with right shoulder pain EXAM: PORTABLE CHEST 1 VIEW COMPARISON:  09/26/2018 FINDINGS: Low lung volumes with strandy atelectasis and or scarring at the bases. No consolidation or effusion. Normal cardiomediastinal silhouette with aortic atherosclerosis. No pneumothorax. Incompletely visualized proximal right humerus fracture. IMPRESSION: 1. Low lung volumes with strandy atelectasis and or scarring at the bases. 2. Incompletely visualized proximal right humerus fracture. Electronically Signed   By: Jasmine Pang M.D.   On: 11/01/2019 23:36   DG Shoulder  Right Portable  Result Date: 11/01/2019 CLINICAL DATA:  Fall, right shoulder pain EXAM: PORTABLE RIGHT SHOULDER COMPARISON:  Chest radiograph 09/26/2018 FINDINGS: Mildly comminuted fracture of the proximal right humerus predominantly extending through the surgical neck with extension into the greater and lesser tuberosities. Associated soft tissue swelling and probable shoulder effusion is present. No other acute osseous injury. The humeral head remains normally located however. Acromioclavicular alignment is maintained. No other acute osseous injury. Remote right rib fractures are similar to comparison radiographs. No acute abnormality in the right chest. IMPRESSION: Mildly comminuted fracture of the proximal right humerus predominantly extending through the surgical neck with extension into the greater and lesser tuberosities. Right rib fractures appear largely healed, remote. Electronically Signed   By: Kreg Shropshire M.D.   On: 11/01/2019 23:37   DG Humerus Right  Result Date: 11/01/2019 CLINICAL DATA:  Status post fall. EXAM: RIGHT HUMERUS - 2+ VIEW COMPARISON:  None. FINDINGS: Acute, mildly impacted fracture deformity is seen involving the head and surgical neck of the proximal right humerus.  There is no evidence of dislocation. Soft tissues are unremarkable. IMPRESSION: Acute fracture of the proximal right humerus. Electronically Signed   By: Aram Candelahaddeus  Houston M.D.   On: 11/01/2019 23:36        Scheduled Meds: . carbidopa-levodopa  1 tablet Oral 5 times per day  . cholecalciferol  1,000 Units Oral Daily  . entacapone  200 mg Oral 3 times per day  . escitalopram  10 mg Oral Daily  . heparin  5,000 Units Subcutaneous Q8H  . levothyroxine  88 mcg Oral Q0600  . multivitamin with minerals  1 tablet Oral Daily  . omega-3 acid ethyl esters  1 g Oral BID  . potassium chloride  40 mEq Oral Once   Continuous Infusions:   LOS: 0 days   Time spent= 35 mins    Jonavin Seder Joline Maxcyhirag Ashaunti Treptow, MD [facility]  If 7PM-7AM, please contact night-coverage  11/03/2019, 9:46 AM

## 2019-11-03 NOTE — Care Management Obs Status (Signed)
MEDICARE OBSERVATION STATUS NOTIFICATION   Patient Details  Name: Tami Vega MRN: [id number] Date of Birth: Aug 07, 1940   Medicare Observation Status Notification Given:  Yes (Patient unable to sign due to broken rm in sling.  Patient gave CSW permission to sign for her.)    Ida Rogue, LCSW 11/03/2019, 1:54 PM

## 2019-11-03 NOTE — Evaluation (Signed)
Occupational Therapy Evaluation Patient Details Name: Tami Vega MRN: [id number] DOB: [birth year] Today's Date: 11/03/2019    History of Present Illness Patient is an 79 y.o. female with history of Parkinson's disease, hypothyroidism presenting to [facility] after a fall at home when patient felt dizzy.  As per the patient patient has been feeling dizzy for the last a few days particularly when she stands up from a sitting position. Pt reports a fall the evening before admission resulting in pt hitting her head on the floor with residual pain in her right shoulder and some neck pain.  Denies any other weakness.  Denies chest pain or shortness of breath. X-rays in the ER revealed right proximal humerus fracture for which ER physician placed patient on sling. CT head is unremarkable CT neck unremarkable   Clinical Impression   This 79 yo female admitted with above presents to acute OT with PLOF of being able to do her own basic ADLs and some IADLs from A RW level. She currently is setup-total A for all basic ADLs and Max A for bed mobility and min A for stand turn. She will benefit from acute OT with follow up at OT at [facility].    Follow Up Recommendations  SNF;Supervision/Assistance - 24 hour    Equipment Recommendations  Other (comment) (TBD next venue)       Precautions / Restrictions Precautions Precautions: Fall Precaution Comments: pt reports this is her only fall in the last 6 months Required Braces or Orthoses: Sling Restrictions Weight Bearing Restrictions: Yes RUE Weight Bearing: Non weight bearing Other Position/Activity Restrictions: NWB on Rt shoulder, sling at all times, pt able to move Rt elbow and wrist as tolerated      Mobility Bed Mobility Overal bed mobility: Needs Assistance Bed Mobility: Supine to Sit     Supine to sit: Max assist     General bed mobility comments: A for legs and trunk  Transfers Overall transfer level: Needs assistance Equipment used: 1 person  hand held assist Transfers: Sit to/from [employer] Sit to Stand: Min assist Stand pivot transfers: Min assist            Balance Overall balance assessment: Needs assistance Sitting-balance support: Single extremity supported;Feet supported Sitting balance-Leahy Scale: Poor     Standing balance support: Single extremity supported Standing balance-Leahy Scale: Poor                             ADL either performed or assessed with clinical judgement   ADL Overall ADL's : Needs assistance/impaired Eating/Feeding: Set up;Sitting Eating/Feeding Details (indicate cue type and reason): in recliner Grooming: Moderate assistance;Sitting Grooming Details (indicate cue type and reason): in recliner Upper Body Bathing: Moderate assistance;Sitting Upper Body Bathing Details (indicate cue type and reason): recliner Lower Body Bathing: Total assistance Lower Body Bathing Details (indicate cue type and reason): Min A sit<>stand and stand pivot Upper Body Dressing : Maximal assistance;Sitting Upper Body Dressing Details (indicate cue type and reason): recliner Lower Body Dressing: Total assistance Lower Body Dressing Details (indicate cue type and reason): min A sit<>stand Toilet Transfer: Minimal assistance;Stand-pivot Toilet Transfer Details (indicate cue type and reason): bed>recliner Toileting- Clothing Manipulation and Hygiene: Total assistance Toileting - Clothing Manipulation Details (indicate cue type and reason): Min A sit<>stand             Vision Patient Visual Report: No change from baseline  Pertinent Vitals/Pain Pain Assessment: Faces Faces Pain Scale: Hurts little more Pain Location: Rt shoulder with mobility Pain Descriptors / Indicators: Aching;Grimacing;Guarding;Discomfort Pain Intervention(s): Limited activity within patient's tolerance;Monitored during session;Repositioned     Hand Dominance Right    Extremity/Trunk Assessment Upper Extremity Assessment Upper Extremity Assessment: RUE deficits/detail RUE Deficits / Details: In shoulder immobilizer, hand WNL           Communication Communication Communication: HOH   Cognition Arousal/Alertness: Awake/alert Behavior During Therapy: WFL for tasks assessed/performed Overall Cognitive Status: No family/caregiver present to determine baseline cognitive functioning Area of Impairment: Memory;Following commands                       Following Commands: Follows one step commands with increased time       General Comments: Pt did report she has had some memory problems              Home Living Family/patient expects to be discharged to:: Skilled nursing facility Living Arrangements: Spouse/significant other Available Help at Discharge: Family Type of Home: House Home Access: Stairs to enter [employer] of Steps: 2 Entrance Stairs-Rails: Right;Left Home Layout: One level     Bathroom Shower/Tub: Walk-in shower;Tub/shower unit   Bathroom Toilet: Standard Bathroom Accessibility: Yes   Home Equipment: Shower seat;Grab bars - tub/shower;Walker - 2 wheels;Cane - quad   Additional Comments: son lives close by      Prior Functioning/Environment Level of Independence: Independent with assistive device(s)        Comments: pt reports she uses RW an quad cane to mobilize in home but does not always use them. she states she was ambulating without device when she had her fall. pt's son reports she does not use them regularly.         OT Problem List: Decreased strength;Decreased range of motion;Impaired balance (sitting and/or standing);Pain;Decreased knowledge of precautions      OT Treatment/Interventions: Self-care/ADL training;DME and/or AE instruction;Patient/family education;Balance training    OT Goals(Current goals can be found in the care plan section) Acute Rehab OT Goals Patient Stated  Goal: to get better and be able to go home OT Goal Formulation: With patient Time For Goal Achievement: 11/17/19 Potential to Achieve Goals: Good  OT Frequency: Min 2X/week              AM-PAC OT "6 Clicks" Daily Activity     Outcome Measure Help from another person eating meals?: A Lot Help from another person taking care of personal grooming?: A Lot Help from another person toileting, which includes using toliet, bedpan, or urinal?: Total Help from another person bathing (including washing, rinsing, drying)?: Total Help from another person to put on and taking off regular upper body clothing?: Total Help from another person to put on and taking off regular lower body clothing?: Total 6 Click Score: 8   End of Session Equipment Utilized During Treatment: Gait belt (RUE sling) Nurse Communication:  (NT: mobility status)  Activity Tolerance: Patient tolerated treatment well Patient left: in chair;with call bell/phone within reach;with chair alarm set  OT Visit Diagnosis: Unsteadiness on feet (R26.81);Other abnormalities of gait and mobility (R26.89);History of falling (Z91.81);Pain;Other symptoms and signs involving cognitive function Pain - Right/Left: Right Pain - part of body: Shoulder                Time: 3716-9678 OT Time Calculation (min): 32 min Charges:  OT General Charges $OT Visit: 1 Visit OT Evaluation $OT Eval Moderate  Complexity: 1 Mod OT Treatments $Self Care/Home Management : 8-22 mins  Ignacia Palma, OTR/L Acute [employer] Pager [phone] Office [phone]    Evette Georges 11/03/2019, 10:29 AM

## 2019-11-03 NOTE — TOC Initial Note (Addendum)
Transition of Care [facility]) - Initial/Assessment Note    Patient Details  Name: Tami Vega MRN: [id number] Date of Birth: [birth year]  [facility]) CM/SW Contact:    Tami Mage, LCSW Phone Number: 11/03/2019, 10:10 AM  Clinical Narrative:  Met with patient in follow up to PT recommendation of SNF for rehab/24 hour supervision.  Tami Vega states she lives at home with her husband in [place], [place].  "He helps out allot, but he is no better shape than I am."  She is open to going to SNF "if my insurance will pay."  Prefers facility in [place] or [facility] in [place].  With her permission, called son who confirmed family's wish for short term rehab, and also confirmed facility choices. Bed search initiated. TOC will continue to follow during the course of hospitalization.  Addendum:  [facility] is not in network.  Left message for [facility] to review and let me know.  Patient does have bed offer in [place], but wants to first know about [place], which is significantly closer for her husband to visit.   Addendum II:  [facility] [place] is also out of network.  Son Tami Vega pursuing bed at [facility].  Spoke to admissions and asked them to initiate insurance authorization this afternoon.                  Expected Discharge Plan: Skilled Nursing Facility Barriers to Discharge: SNF Pending bed offer   Patient Goals and CMS Choice Patient states their goals for this hospitalization and ongoing recovery are:: "I think I had a fall, I'm not sure what happened."   Choice offered to / list presented to : Patient, Adult Children  Expected Discharge Plan and Services Expected Discharge Plan: [place]   Discharge Planning Services: CM Consult Post Acute Care Choice: [place] Living arrangements for the past 2 months: Single Family Home                                      Prior Living  Arrangements/Services Living arrangements for the past 2 months: Single Family Home Lives with:: Spouse Patient language and need for interpreter reviewed:: Yes        Need for Family Participation in Patient Care: Yes (Comment) Care giver support system in place?: Yes (comment)   Criminal Activity/Legal Involvement Pertinent to Current Situation/Hospitalization: No - Comment as needed  Activities of Daily Living Home Assistive Devices/Equipment: Eyeglasses ADL Screening (condition at time of admission) Patient's cognitive ability adequate to safely complete daily activities?: Yes Is the patient deaf or have difficulty hearing?: No Does the patient have difficulty seeing, even when wearing glasses/contacts?: Yes Does the patient have difficulty concentrating, remembering, or making decisions?: No Patient able to express need for assistance with ADLs?: Yes Does the patient have difficulty dressing or bathing?: Yes Independently performs ADLs?: No Communication: Independent Dressing (OT): Needs assistance Is this a change from baseline?: Change from baseline, expected to last >3 days Grooming: Needs assistance Is this a change from baseline?: Change from baseline, expected to last >3 days Feeding: Needs assistance Is this a change from baseline?: Change from baseline, expected to last >3 days Bathing: Needs assistance Is this a change from baseline?: Change from baseline, expected to last >3 days Toileting: Independent In/Out Bed: Independent Walks in Home: Independent Does the patient have difficulty walking or  climbing stairs?: Yes Weakness of Legs: Both Weakness of Arms/Hands: Right  Permission Sought/Granted Permission sought to share information with : Family Supports Permission granted to share information with : Yes, Verbal Permission Granted  Share Information with NAME: Tami Vega     Permission granted to share info w Relationship: son  Permission granted to  share info w Contact Information: [phone]  Emotional Assessment Appearance:: Appears stated age Attitude/Demeanor/Rapport: Engaged Affect (typically observed): Appropriate Orientation: : Oriented to Self, Oriented to Place, Oriented to Situation Alcohol / Substance Use: Not Applicable Psych Involvement: No (comment)  Admission diagnosis:  Dizziness [R42] Unsteady gait [R26.81] Fall, initial encounter [W19.XXXA] Other closed nondisplaced fracture of proximal end of right humerus, initial encounter [S42.294A] Patient Active Problem List   Diagnosis Date Noted  . Dizziness 11/02/2019  . Parkinson's disease ([place]) 11/02/2019  . Proximal humerus fracture 11/02/2019  . Hypothyroidism 11/02/2019  . Chest discomfort 12/09/2012   PCP:  Townsend Roger, MD Pharmacy:   [id number] [place], [place] - [address] [place] [address]Phone: [phone]: [phone]    Social Determinants of Health (SDOH) Interventions    Readmission Risk Interventions No flowsheet data found.

## 2019-11-04 ENCOUNTER — Observation Stay (HOSPITAL_COMMUNITY): Payer: Medicare HMO

## 2019-11-04 DIAGNOSIS — R42 Dizziness and giddiness: Secondary | ICD-10-CM | POA: Diagnosis not present

## 2019-11-04 DIAGNOSIS — J9811 Atelectasis: Secondary | ICD-10-CM | POA: Diagnosis not present

## 2019-11-04 DIAGNOSIS — R0600 Dyspnea, unspecified: Secondary | ICD-10-CM | POA: Diagnosis not present

## 2019-11-04 LAB — BASIC METABOLIC PANEL
Anion gap: 11 (ref 5–15)
BUN: 22 mg/dL (ref 8–23)
CO2: 23 mmol/L (ref 22–32)
Calcium: 8.6 mg/dL — ABNORMAL LOW (ref 8.9–10.3)
Chloride: 104 mmol/L (ref 98–111)
Creatinine, Ser: 0.78 mg/dL (ref 0.44–1.00)
GFR calc Af Amer: >60 mL/min (ref >60)
GFR calc non Af Amer: >60 mL/min (ref >60)
Glucose, Bld: 106 mg/dL — ABNORMAL HIGH (ref 70–99)
Potassium: 4 mmol/L (ref 3.5–5.1)
Sodium: 138 mmol/L (ref 135–145)

## 2019-11-04 LAB — MAGNESIUM: Magnesium: 2.2 mg/dL (ref 1.7–2.4)

## 2019-11-04 MED ORDER — OXYCODONE HCL 5 MG PO TABS: 5.0000 mg | ORAL_TABLET | ORAL | 0 refills | Status: DC | PRN

## 2019-11-04 MED ORDER — SENNOSIDES-DOCUSATE SODIUM 8.6-50 MG PO TABS: 2.0000 | ORAL_TABLET | Freq: Every evening | ORAL | Status: DC | PRN

## 2019-11-04 MED ORDER — VITAMIN D3 25 MCG PO TABS: 1000.0000 [IU] | ORAL_TABLET | Freq: Every day | ORAL | Status: DC

## 2019-11-04 NOTE — Progress Notes (Signed)
Physical Therapy Treatment Patient Details Name: Tami Vega MRN: [id number] DOB: September 23, 1940 Today's Date: 11/04/2019    History of Present Illness Patient is an 79 y.o. female with history of Parkinson's disease, hypothyroidism presenting to [facility] after a fall at home when patient felt dizzy.  As per the patient patient has been feeling dizzy for the last a few days particularly when she stands up from a sitting position. Pt reports a fall the evening before admission resulting in pt hitting her head on the floor with residual pain in her right shoulder and some neck pain.  Denies any other weakness.  Denies chest pain or shortness of breath. X-rays in the ER revealed right proximal humerus fracture per ortho limit use of R shoulder and NWB in sling. CT head is unremarkable CT neck unremarkable    PT Comments    Pt making good progress today.  She was able to ambulate a few steps with HHA.  Required cues for sequencing with transfers.  Pt did have c/o dizziness in standing and was found to have orthostatic hypotension.  Notified RN.  Cont POC.     Follow Up Recommendations  SNF;Supervision/Assistance - 24 hour     Equipment Recommendations  Cane (to be further assessed/next venue)    Recommendations for Other Services       Precautions / Restrictions Precautions Precautions: Fall Precaution Comments: pt reports this is her only fall in the last 6 months; monitor BP Required Braces or Orthoses: Sling Restrictions RUE Weight Bearing: Non weight bearing Other Position/Activity Restrictions: NWB on Rt shoulder, sling at all times, pt able to move Rt elbow and wrist as tolerated    Mobility  Bed Mobility Overal bed mobility: Needs Assistance Bed Mobility: Supine to Sit     Supine to sit: Min assist     General bed mobility comments: cues for sequence and min A to lift trunk using L UE  Transfers Overall transfer level: Needs assistance Equipment used: 1 person hand held  assist Transfers: Sit to/from [employer] Sit to Stand: Min guard Stand pivot transfers: Min guard       General transfer comment: sit to stand x 4  Ambulation/Gait Ambulation/Gait assistance: Min assist Gait Distance (Feet): 12 Feet Assistive device: 1 person hand held assist Gait Pattern/deviations: Decreased stride length;Trunk flexed Gait velocity: decreased   General Gait Details: HHA with L UE; fatigued easily; limited by c/o dizziness so returned to sitting   Stairs             Wheelchair Mobility    Modified Rankin (Stroke Patients Only)       Balance Overall balance assessment: Needs assistance Sitting-balance support: No upper extremity supported Sitting balance-Leahy Scale: Fair     Standing balance support: Single extremity supported Standing balance-Leahy Scale: Poor Standing balance comment: Required HHA                            Cognition Arousal/Alertness: Awake/alert Behavior During Therapy: WFL for tasks assessed/performed Overall Cognitive Status: History of cognitive impairments - at baseline                                 General Comments: Baseline impaired memory; Pt able to answer questions appropriatly and follow commands      Exercises General Exercises - Lower Extremity Ankle Circles/Pumps: AROM;20 reps;Both;Seated Long Arc Quad: AROM;20 reps;Both;Seated  Hip Flexion/Marching: AROM;20 reps;Seated;Both    General Comments General comments (skin integrity, edema, etc.): Pt had c/o dizziness when sitting EOB checked and BP 148/116 but pt moving arm.  Rechecked once in recliner and relaxed and bp 95/79; performed walking and c/o dizziness with BP 82/68 with return to sitting; reclined and after 5 mins BP 109/70.  Notified RN of orthostatic hypotension.  O2 sats were 92% on RA.  HR stable.      Pertinent Vitals/Pain Pain Assessment: 0-10 Pain Score: 4  Pain Location: R shoulder with  trnasfers Pain Descriptors / Indicators: Discomfort;Guarding Pain Intervention(s): Limited activity within patient's tolerance;Monitored during session;Premedicated before session;Repositioned    Home Living                      Prior Function            PT Goals (current goals can now be found in the care plan section) Acute Rehab PT Goals Patient Stated Goal: to get better and be able to go home PT Goal Formulation: With patient/family Time For Goal Achievement: 11/16/19 Potential to Achieve Goals: Fair Progress towards PT goals: Progressing toward goals    Frequency    Min 2X/week      PT Plan Current plan remains appropriate    Co-evaluation              AM-PAC PT "6 Clicks" Mobility   Outcome Measure  Help needed turning from your back to your side while in a flat bed without using bedrails?: A Little Help needed moving from lying on your back to sitting on the side of a flat bed without using bedrails?: A Little Help needed moving to and from a bed to a chair (including a wheelchair)?: A Little Help needed standing up from a chair using your arms (e.g., wheelchair or bedside chair)?: A Little Help needed to walk in hospital room?: A Little Help needed climbing 3-5 steps with a railing? : A Lot 6 Click Score: 17    End of Session Equipment Utilized During Treatment: Gait belt Activity Tolerance: Patient tolerated treatment well Patient left: with chair alarm set;in chair;with call bell/phone within reach Nurse Communication: Mobility status;Other (comment) (orthostatic) PT Visit Diagnosis: Muscle weakness (generalized) (M62.81);Other abnormalities of gait and mobility (R26.89);Difficulty in walking, not elsewhere classified (R26.2);Pain Pain - Right/Left: Right Pain - part of body: Shoulder     Time: 1540-1605 PT Time Calculation (min) (ACUTE ONLY): 25 min  Charges:  $Gait Training: 8-22 mins $Therapeutic Exercise: 8-22 mins                      Anise Salvo, PT Acute Rehab Services Pager [phone] [facility] [phone]     Rayetta Humphrey 11/04/2019, 4:53 PM

## 2019-11-04 NOTE — Progress Notes (Addendum)
Occupational Therapy Treatment Patient Details Name: Tami Vega MRN: [id number] DOB: [birth year] Today's Date: 11/04/2019    History of present illness Patient is an 79 y.o. female with history of Parkinson's disease, hypothyroidism presenting to [facility] after a fall at home when patient felt dizzy.  As per the patient patient has been feeling dizzy for the last a few days particularly when she stands up from a sitting position. Pt reports a fall the evening before admission resulting in pt hitting her head on the floor with residual pain in her right shoulder and some neck pain.  Denies any other weakness.  Denies chest pain or shortness of breath. X-rays in the ER revealed right proximal humerus fracture for which ER physician placed patient on sling. CT head is unremarkable CT neck unremarkable   OT comments  Patient progressing and showed improved overall ADL performance compared to previous session as evidenced by increased 6-Clicks AM-PAC score of occupational performance from 8/24 upon OT evaluation, to 14/24 today. Patient limited by RUE pain and precautions/immobilzation along with deficits noted below. Pt continues to demonstrate good rehab potential and would benefit from continued skilled OT to increase safety and independence with ADLs and functional transfers to allow pt to return home safely and reduce caregiver burden and fall risk.   Follow Up Recommendations  SNF;Supervision/Assistance - 24 hour    Equipment Recommendations  Other (comment) (Will defer to post-acute recommendations)    Recommendations for Other Services      Precautions / Restrictions Precautions Precautions: Fall Required Braces or Orthoses: Sling Restrictions Weight Bearing Restrictions: Yes RUE Weight Bearing: Non weight bearing Other Position/Activity Restrictions: NWB on Rt shoulder, sling at all times, pt able to move Rt elbow and wrist as tolerated       Mobility Bed Mobility Overal bed  mobility: Needs Assistance Bed Mobility: Supine to Sit     Supine to sit: Mod assist Sit to supine: Min assist   General bed mobility comments: Latearl scooting along EOB with SBA and cues.  Transfers Overall transfer level: Needs assistance Equipment used: 1 person hand held assist Transfers: Sit to/from [employer] Sit to Stand: Min guard Stand pivot transfers: Min guard            Balance Overall balance assessment: Needs assistance Sitting-balance support: No upper extremity supported Sitting balance-Leahy Scale: Fair Sitting balance - Comments: pt with slouch posture at EOB, often rocking in place but no LOB     Standing balance-Leahy Scale: Poor Standing balance comment: Required assistance of gait belt                           ADL either performed or assessed with clinical judgement   ADL Overall ADL's : Needs assistance/impaired     Grooming: Min guard;Wash/dry hands;Standing Grooming Details (indicate cue type and reason): Stood at sink, using one-hand technique to wash and dry LT hand.         Upper Body Dressing : Maximal assistance Upper Body Dressing Details (indicate cue type and reason): Pt and son shown how to doff and don RUE sling and how to position RUE correctly in sling during mobility, and in sling and on pillwos at rest to avoid pull on neck. Lower Body Dressing: Maximal assistance Lower Body Dressing Details (indicate cue type and reason): Pt stood to doff underwear over hips prior to toileting with Min As.  Required max assist to don underwear over hips while  standing and using 1 handed techniques as cued. Toilet Transfer: Regular Toilet;Minimal assistance Statistician Details (indicate cue type and reason): Used LUE crossing midline to grip wall mounted grab bar and stand from toilet with contact guard assist.  Min As given for controlled descent onto commode. Toileting- Clothing Manipulation and Hygiene: Set  up;Min guard;Sitting/lateral lean Toileting - Clothing Manipulation Details (indicate cue type and reason): Contact guard assist and full setup for pt to perform anterior perineal hygiene on commode.  Clothing mngt (see LE dressing above)     Functional mobility during ADLs: Minimal assistance       Vision Patient Visual Report: No change from baseline Additional Comments: Pt was having difficulty distinguising written exercises from pictures.  Son agreed to work with pt and train his sister who will also assist in pt's care, on pt's HEP.   Perception     Praxis Praxis Praxis-Other Comments: Parkinson's dz    Cognition Arousal/Alertness: Awake/alert Behavior During Therapy: WFL for tasks assessed/performed Overall Cognitive Status: History of cognitive impairments - at baseline Area of Impairment: Memory;Following commands;Problem solving                 Orientation Level: Disoriented to;Time (Stated place as "somewhere that helps people I guess."  Pt able to pick "hospital" from 3 choices.)   Memory: Decreased short-term memory Following Commands: Follows one step commands with increased time       General Comments: Baseline impaired memory        Exercises Shoulder Exercises Elbow Flexion:  (Pt attempted 1 rep of elbow flex/extension but with increased pain. Reminded pt exercises are "as tolerated" and discontinued elbow ex for today.) Hand Exercises Forearm Supination: AROM;10 reps;Seated Forearm Pronation: AROM;Seated;10 reps Wrist Flexion: AROM;Seated;10 reps Wrist Extension: AROM;Seated;10 reps  Finger flex/ext x10 reps   Shoulder Instructions       General Comments      Pertinent Vitals/ Pain       Pain Assessment: Faces Faces Pain Scale: Hurts little more Pain Location: Rt shoulder with mobility.  Up to 8/10 when pt attempted gentle elbow flex;/ext, therefore this exercise terminated after 1 rep. Pain Descriptors / Indicators:  Aching;Grimacing;Guarding;Discomfort Pain Intervention(s): Limited activity within patient's tolerance;Monitored during session;Repositioned  Home Living                                          Prior Functioning/Environment              Frequency  Min 2X/week        Progress Toward Goals  OT Goals(current goals can now be found in the care plan section)  Progress towards OT goals: Progressing toward goals  Acute Rehab OT Goals Patient Stated Goal: to get better and be able to go home OT Goal Formulation: With patient/family Time For Goal Achievement: 11/17/19 Potential to Achieve Goals: Good  Plan Discharge plan remains appropriate    Co-evaluation                 AM-PAC OT "6 Clicks" Daily Activity     Outcome Measure   Help from another person eating meals?: A Little Help from another person taking care of personal grooming?: A Little Help from another person toileting, which includes using toliet, bedpan, or urinal?: A Lot Help from another person bathing (including washing, rinsing, drying)?: A Lot Help from another person to put  on and taking off regular upper body clothing?: A Lot Help from another person to put on and taking off regular lower body clothing?: A Lot 6 Click Score: 14    End of Session Equipment Utilized During Treatment: Gait belt  OT Visit Diagnosis: Unsteadiness on feet (R26.81);Other abnormalities of gait and mobility (R26.89);History of falling (Z91.81);Pain;Other symptoms and signs involving cognitive function Pain - Right/Left: Right Pain - part of body: Shoulder;Arm   Activity Tolerance Patient tolerated treatment well   Patient Left in bed;with call bell/phone within reach;with family/visitor present   Nurse Communication Mobility status        Time: 1137-1207 OT Time Calculation (min): 30 min  Charges: OT General Charges $OT Visit: 1 Visit OT Treatments $Self Care/Home Management : 8-22  mins $Therapeutic Exercise: 8-22 mins  Victorino Dike, OT Acute Rehab Services Office: [phone] 11/04/2019    Theodoro Clock 11/04/2019, 12:56 PM

## 2019-11-04 NOTE — Progress Notes (Signed)
PROGRESS NOTE    Tami Vega  PPI:[id number] DOB: Jan 29, 1941 DOA: 11/01/2019 PCP: Crist Fat, MD   Brief Narrative:  79 y.o. female with history of Parkinson's disease, hypothyroidism and a fall at home when patient felt dizzy.  As per the patient patient has been feeling dizzy for the last a few days particularly when she stands up from a sitting position. X-rays in the ER revealed right proximal humerus fracture for which ER physician placed patient on sling.  CT head is unremarkable CT neck is pending since patient was complaining of neck pain.  EKG shows normal sinus rhythm complete metabolic panel CBC unremarkable.  Covid test negative.  Patient admitted for dizziness and right proximal humerus fracture. Seen by ortho, recommends conservative management.  PT has recommended SNF, patient and son agreeable for this therefore TOC team to make arrangements for this.   Assessment & Plan:   Principal Problem:   Dizziness Active Problems:   Parkinson's disease (HCC)   Proximal humerus fracture   Hypothyroidism   Dizziness/Presyncope without LOC Right Humerus Fracture  -Her dizziness is improved, IV fluids has been stopped as she is tolerating orals and without any issues. -PT recommending SNF.  TOC team aware to make arrangements. -Seen by orthopedic, nonoperative management.  Sling in place.  Outpatient follow-up with Dr. Ave Filter -Pain medication and bowel regimen has been prescribed. -Encouraged her to use incentive spirometer.  Out of bed to chair whenever possible especially while she is in the hospital.  Parkinson's Disease  -Continue home meds  Hypothyroidism -TSH within normal limits.  Continue Synthroid  Vitamin D deficiency -Vitamin D supplements has been started.   DVT prophylaxis: Heparin Code Status: Full  Family Communication:  Spoke with her Doristine Locks- yesterday    Dispo: The patient is from: Home              Anticipated d/c is to: SNF               Anticipated d/c date is: 1 day             Patient is currently medically stable to be discharged to SNF when bed is available       There is no height or weight on file to calculate BMI.    Subjective: Patient lying in the bed does not have any complaints this morning, she was little drowsy as she was waking up.  Reported of some right shoulder pain as expected from her fracture but no other new complaints  Review of Systems Otherwise negative except as per HPI, including: General: Denies fever, chills, night sweats or unintended weight loss. Resp: Denies cough, wheezing, shortness of breath. Cardiac: Denies chest pain, palpitations, orthopnea, paroxysmal nocturnal dyspnea. GI: Denies abdominal pain, nausea, vomiting, diarrhea or constipation GU: Denies dysuria, frequency, hesitancy or incontinence MS: Denies myalgias Neuro: Denies headache, neurologic deficits (focal weakness, numbness, tingling), abnormal gait Psych: Denies anxiety, depression, SI/HI/AVH Skin: Denies new rashes or lesions ID: Denies sick contacts, exotic exposures, travel  Examination:  Constitutional: Not in acute distress Respiratory: Very minimal bibasilar rhonchi. Cardiovascular: Normal sinus rhythm, no rubs Abdomen: Nontender nondistended good bowel sounds Musculoskeletal: Limited range of motion of her right shoulder, sling in place. Skin: No rashes seen Neurologic: CN 2-12 grossly intact.  And nonfocal Psychiatric: Normal judgment and insight. Alert and oriented x 3. Normal mood.   Objective: Vitals:   11/03/19 1200 11/03/19 1514 11/03/19 2006 11/04/19 0507  BP:  (!) 126/93 124/61 130/73  Pulse:  86 74 73  Resp:  16 20 18   Temp:  98.3 F (36.8 C) 98.5 F (36.9 C) 97.7 F (36.5 C)  TempSrc:  Oral    SpO2: 92% 94% 90% 94%    Intake/Output Summary (Last 24 hours) at 11/04/2019 1022 Last data filed at 11/04/2019 0500 Gross per 24 hour  Intake 1062 ml  Output 2375 ml  Net -1313 ml   There  were no vitals filed for this visit.   Data Reviewed:   CBC: Recent Labs  Lab 11/01/19 2354 11/02/19 0439  WBC 5.8 7.7  NEUTROABS 2.8  --   HGB 12.5 10.2*  HCT 36.1 30.2*  MCV 89.4 91.2  PLT 159 156   Basic Metabolic Panel: Recent Labs  Lab 11/01/19 2354 11/02/19 0439 11/03/19 0322 11/04/19 0316  NA 136 135 137 138  K 3.6 3.1* 3.4* 4.0  CL 105 108 104 104  CO2 24 19* 24 23  GLUCOSE 113* 108* 102* 106*  BUN 33* 29* 30* 22  CREATININE 0.90 0.59 0.66 0.78  CALCIUM 9.0 7.3* 8.7* 8.6*  MG  --   --  2.3 2.2   GFR: CrCl cannot be calculated (Unknown ideal weight.). Liver Function Tests: Recent Labs  Lab 11/01/19 2354  AST 11*  ALT 9  ALKPHOS 47  BILITOT 1.3*  PROT 6.7  ALBUMIN 4.2   No results for input(s): LIPASE, AMYLASE in the last 168 hours. No results for input(s): AMMONIA in the last 168 hours. Coagulation Profile: No results for input(s): INR, PROTIME in the last 168 hours. Cardiac Enzymes: No results for input(s): CKTOTAL, CKMB, CKMBINDEX, TROPONINI in the last 168 hours. BNP (last 3 results) No results for input(s): PROBNP in the last 8760 hours. HbA1C: No results for input(s): HGBA1C in the last 72 hours. CBG: No results for input(s): GLUCAP in the last 168 hours. Lipid Profile: No results for input(s): CHOL, HDL, LDLCALC, TRIG, CHOLHDL, LDLDIRECT in the last 72 hours. Thyroid Function Tests: Recent Labs    11/02/19 1655  TSH 1.608   Anemia Panel: No results for input(s): VITAMINB12, FOLATE, FERRITIN, TIBC, IRON, RETICCTPCT in the last 72 hours. Sepsis Labs: No results for input(s): PROCALCITON, LATICACIDVEN in the last 168 hours.  Recent Results (from the past 240 hour(s))  SARS Coronavirus 2 by RT PCR (hospital order, performed in [facility] hospital lab) Nasopharyngeal Nasopharyngeal Swab     Status: None   Collection Time: 11/02/19  1:30 AM   Specimen: Nasopharyngeal Swab  Result Value Ref Range Status   SARS Coronavirus 2 NEGATIVE  NEGATIVE Final    Comment: (NOTE) SARS-CoV-2 target nucleic acids are NOT DETECTED.  The SARS-CoV-2 RNA is generally detectable in upper and lower respiratory specimens during the acute phase of infection. The lowest concentration of SARS-CoV-2 viral copies this assay can detect is 250 copies / mL. A negative result does not preclude SARS-CoV-2 infection and should not be used as the sole basis for treatment or other patient management decisions.  A negative result may occur with improper specimen collection / handling, submission of specimen other than nasopharyngeal swab, presence of viral mutation(s) within the areas targeted by this assay, and inadequate number of viral copies (<250 copies / mL). A negative result must be combined with clinical observations, patient history, and epidemiological information.  Fact Sheet for Patients:   01/02/20  Fact Sheet for Healthcare Providers: BoilerBrush.com.cy  This test is not yet approved or  cleared by the https://pope.com/ FDA and  has been authorized for detection and/or diagnosis of SARS-CoV-2 by FDA under an Emergency Use Authorization (EUA).  This EUA will remain in effect (meaning this test can be used) for the duration of the COVID-19 declaration under Section 564(b)(1) of the Act, 21 U.S.C. section 360bbb-3(b)(1), unless the authorization is terminated or revoked sooner.  Performed at [facility], [address] [place], [place] [address]   Urine culture     Status: Abnormal   Collection Time: 11/02/19 10:39 AM   Specimen: Urine, Catheterized  Result Value Ref Range Status   Specimen Description   Final    URINE, CATHETERIZED Performed at [facility], [address], [place], [place] [address]    Special Requests   Final    NONE Performed at [facility], [address] [place], [place] [address]    Culture  MULTIPLE SPECIES PRESENT, SUGGEST RECOLLECTION (A)  Final   Report Status 11/03/2019 FINAL  Final         Radiology Studies: DG Chest Port 1 View  Result Date: 11/04/2019 CLINICAL DATA:  Fall 3 days ago, dyspnea EXAM: PORTABLE CHEST 1 VIEW COMPARISON:  11/01/2019 FINDINGS: Mild atelectasis in the left lower lung. Right lung is clear. No pleural effusion or pneumothorax. The heart is normal in size. Right humeral neck fracture, unchanged. IMPRESSION: Mild atelectasis in the left lower lung. Right femoral neck fracture, unchanged. Electronically Signed   By: Charline Bills M.D.   On: 11/04/2019 08:21        Scheduled Meds: . carbidopa-levodopa  1 tablet Oral 5 times per day  . cholecalciferol  1,000 Units Oral Daily  . entacapone  200 mg Oral 3 times per day  . escitalopram  10 mg Oral Daily  . heparin  5,000 Units Subcutaneous Q8H  . levothyroxine  88 mcg Oral Q0600  . multivitamin with minerals  1 tablet Oral Daily  . omega-3 acid ethyl esters  1 g Oral BID   Continuous Infusions:   LOS: 0 days   Time spent= 35 mins    Rayane Gallardo Joline Maxcy, MD [facility]  If 7PM-7AM, please contact night-coverage  11/04/2019, 10:22 AM

## 2019-11-05 DIAGNOSIS — R42 Dizziness and giddiness: Secondary | ICD-10-CM | POA: Diagnosis not present

## 2019-11-05 LAB — BASIC METABOLIC PANEL
Anion gap: 11 (ref 5–15)
BUN: 18 mg/dL (ref 8–23)
CO2: 22 mmol/L (ref 22–32)
Calcium: 8.6 mg/dL — ABNORMAL LOW (ref 8.9–10.3)
Chloride: 104 mmol/L (ref 98–111)
Creatinine, Ser: 0.69 mg/dL (ref 0.44–1.00)
GFR calc Af Amer: >60 mL/min (ref >60)
GFR calc non Af Amer: >60 mL/min (ref >60)
Glucose, Bld: 106 mg/dL — ABNORMAL HIGH (ref 70–99)
Potassium: 3.5 mmol/L (ref 3.5–5.1)
Sodium: 137 mmol/L (ref 135–145)

## 2019-11-05 LAB — MAGNESIUM: Magnesium: 2 mg/dL (ref 1.7–2.4)

## 2019-11-05 LAB — BRAIN NATRIURETIC PEPTIDE: B Natriuretic Peptide: 26.6 pg/mL (ref 0.0–100.0)

## 2019-11-05 MED ORDER — POTASSIUM CHLORIDE CRYS ER 20 MEQ PO TBCR
40.0000 meq | EXTENDED_RELEASE_TABLET | Freq: Once | ORAL | Status: AC
  Administered 2019-11-05: 40 meq via ORAL
  Filled 2019-11-05: qty 2

## 2019-11-05 NOTE — Discharge Summary (Addendum)
Physician Discharge Summary  Mekaila Tarnow IWL:[id number] DOB: [birth year] DOA: 11/01/2019  PCP: Crist Fat, MD  Admit date: 11/01/2019 Discharge date: 11/08/2019  Admitted From: Home Disposition: SNF  Recommendations for Outpatient Follow-up:  1. Follow up with PCP in 1-2 weeks 2. Please obtain BMP/CBC in one week your next doctors visit.  3. Pain medications with bowel regimen 4. Follow-up patient orthopedic, Dr. Bretta Bang days 5. Right shoulder-maintain sling in place, avoid use of right shoulder but okay to utilize hand and elbow as tolerated 6. Daily vitamin D supplements    Discharge Condition: Stable CODE STATUS: Full Diet recommendation: Regular  Brief/Interim Summary: 79 y.o.femalewithhistory of Parkinson's disease, hypothyroidism and a fall at home when patient felt dizzy. As per the patient patient has been feeling dizzy for the last a few days particularly when she stands up from a sitting position. X-rays in the ER revealed right proximal humerus fracture for which ER physician placed patient on sling. CT head is unremarkable CT neck is pending since patient was complaining of neck pain. EKG shows normal sinus rhythm complete metabolic panel CBC unremarkable. Covid test negative. Patient admitted for dizziness and right proximal humerus fracture. Seen by ortho, recommends conservative management.  PT has recommended SNF, patient and son agreeable for this therefore TOC team to make arrangements for this.  Discharge to SNF when bed available   Assessment & Plan:   Principal Problem:   Dizziness Active Problems:   Parkinson's disease (HCC)   Proximal humerus fracture   Hypothyroidism   Dizziness/Presyncope without LOC Right Humerus Fracture  -Dizziness improved.  Tolerating orals.  Encourage use of incentive spirometer -PT recommending SNF.  TOC team aware to make arrangements. -Seen by orthopedic, nonoperative management.  Sling in place.   Outpatient follow-up with Dr. Ave Filter -Pain medication and bowel regimen has been prescribed. -Encouraged her to use incentive spirometer.  Out of bed to chair whenever possible especially while she is in the hospital.  Mild left lung atelectasis at the bases -Advised to use incentive spirometer, out of bed to chair.  Mobilize whenever possible  Parkinson's Disease  -Continue home meds  Hypothyroidism -TSH within normal limits.  Continue Synthroid  Vitamin D deficiency -Vitamin D supplements has been started.  Body mass index is 25.69 kg/m.    Discharge Diagnoses:  Principal Problem:   Dizziness Active Problems:   Parkinson's disease (HCC)   Proximal humerus fracture   Hypothyroidism   Consultations:  Orthopedic  Subjective: Patient feels little better this morning, daughter is at bedside as well.  Still having right shoulder discomfort as expected from her fracture but no other new complaints. I explained them that she does have atelectasis on her x-ray but no evidence of infection therefore she has to aggressively use her incentive spirometer, try and get out of bed to chair as well.  Discharge Exam: Vitals:   11/07/19 1957 11/08/19 0558  BP: (!) 118/53 124/67  Pulse: 80 65  Resp: 18 17  Temp: 97.9 F (36.6 C) 97.9 F (36.6 C)  SpO2: 92% 92%   Vitals:   11/07/19 0414 11/07/19 1431 11/07/19 1957 11/08/19 0558  BP: 128/62 101/84 (!) 118/53 124/67  Pulse: 67 75 80 65  Resp: 18 16 18 17   Temp: 97.8 F (36.6 C) 98.5 F (36.9 C) 97.9 F (36.6 C) 97.9 F (36.6 C)  TempSrc:  Oral Oral   SpO2: 92% 93% 92% 92%  Weight:      Height:  General: Pt is alert, awake, not in acute distress Cardiovascular: RRR, S1/S2 +, no rubs, no gallops Respiratory: Minimal bibasilar rhonchi. Abdominal: Soft, NT, ND, bowel sounds + Extremities: no edema, no cyanosis Right shoulder sling in place for her humeral fracture.  Some ecchymosis noted over the  site.  Discharge Instructions   Allergies as of 11/08/2019   No Known Allergies     Medication List    TAKE these medications   b complex vitamins tablet Take 1 tablet by mouth daily.   CALCIUM 1200 PO Take by mouth daily. Take one tablet a day   carbidopa-levodopa 25-100 MG tablet Commonly known as: SINEMET IR Take 1 tablet by mouth 5 (five) times daily. Take at  7 AM, 10 AM, 1 PM, 4 PM and  7 PM   entacapone 200 MG tablet Commonly known as: Comtan Take 1 tablet (200 mg total) by mouth 3 (three) times daily. Take with Sinemet dose at 7 AM, 1 PM and 7 PM.   escitalopram 10 MG tablet Commonly known as: LEXAPRO Take 10 mg by mouth daily.   fish oil-omega-3 fatty acids 1000 MG capsule Take 2 g by mouth daily.   levothyroxine 88 MCG tablet Commonly known as: SYNTHROID Take 88 mcg by mouth daily before breakfast.   melatonin 5 MG Tabs Take 5 mg by mouth at bedtime as needed (sleep).   multivitamin with minerals Tabs tablet Take 1 tablet by mouth daily.   oxyCODONE 5 MG immediate release tablet Commonly known as: Oxy IR/ROXICODONE Take 1 tablet (5 mg total) by mouth every 4 (four) hours as needed for moderate pain or severe pain.   polyethylene glycol 17 g packet Commonly known as: MIRALAX / GLYCOLAX Take 17 g by mouth daily as needed.   rasagiline 1 MG Tabs tablet Commonly known as: Azilect Take 1 tablet (1 mg total) by mouth daily.   senna-docusate 8.6-50 MG tablet Commonly known as: Senokot-S Take 2 tablets by mouth at bedtime as needed for mild constipation or moderate constipation.   Vitamin D3 25 MCG tablet Commonly known as: Vitamin D Take 1 tablet (1,000 Units total) by mouth daily.       Contact information for follow-up providers    Leonia Reader, Barbara Cower, MD. Schedule an appointment as soon as possible for a visit in 1 week(s).   Specialty: Internal Medicine Contact information: [address] [place] [address] [phone]             Contact information for after-discharge care    Destination    [facility]erred SNF .   Service: Skilled Nursing Contact information: [address] [place] [address] [phone]                 No Known Allergies  You were cared for by a hospitalist during your hospital stay. If you have any questions about your discharge medications or the care you received while you were in the hospital after you are discharged, you can call the unit and asked to speak with the hospitalist on call if the hospitalist that took care of you is not available. Once you are discharged, your primary care physician will handle any further medical issues. Please note that no refills for any discharge medications will be authorized once you are discharged, as it is imperative that you return to your primary care physician (or establish a relationship with a primary care physician if you do not have one)  for your aftercare needs so that they can reassess your need for medications and monitor your lab values.   Procedures/Studies: CT Head Wo Contrast  Result Date: 11/02/2019 CLINICAL DATA:  Head trauma fall EXAM: CT HEAD WITHOUT CONTRAST TECHNIQUE: Contiguous axial images were obtained from the base of the skull through the vertex without intravenous contrast. COMPARISON:  Brain MRI 06/28/2006 FINDINGS: Brain: No acute territorial infarction, hemorrhage or intracranial mass. Mild atrophy. Nonenlarged ventricles. Vascular: No hyperdense vessels.  Carotid vascular calcification. Skull: Normal. Negative for fracture or focal lesion. Sinuses/Orbits: Mucosal thickening in the maxillary sinuses. Partially calcified soft tissue density or small mass at the right superior nasal passage. Other: None IMPRESSION: 1. No CT evidence for acute intracranial abnormality. 2. Atrophy. Electronically Signed   By: Jasmine PangKim  Fujinaga M.D.   On: 11/02/2019 01:58   CT CERVICAL SPINE WO CONTRAST  Result  Date: 11/02/2019 CLINICAL DATA:  Acute neck pain after a fall at home EXAM: CT CERVICAL SPINE WITHOUT CONTRAST TECHNIQUE: Multidetector CT imaging of the cervical spine was performed without intravenous contrast. Multiplanar CT image reconstructions were also generated. COMPARISON:  None. FINDINGS: Alignment: Normal. Skull base and vertebrae: No acute fracture. No primary bone lesion or focal pathologic process. Soft tissues and spinal canal: No prevertebral fluid or swelling. No visible canal hematoma. Disc levels: Degenerative changes with narrowed interspaces and endplate hypertrophic changes most prominent at C5-6, C6-7, and T1-2 levels. Degenerative changes in the facet joints. Upper chest: Suggestion of a diffuse interstitial pattern in the lung apices, possibly edema or pneumonitis. Other: None. IMPRESSION: 1. No acute displaced fractures identified in the cervical spine. Degenerative changes. 2. Suggestion of a diffuse interstitial pattern in the lung apices, possibly edema or pneumonitis. Electronically Signed   By: Burman NievesWilliam  Stevens M.D.   On: 11/02/2019 04:05   DG Chest Port 1 View  Result Date: 11/04/2019 CLINICAL DATA:  Fall 3 days ago, dyspnea EXAM: PORTABLE CHEST 1 VIEW COMPARISON:  11/01/2019 FINDINGS: Mild atelectasis in the left lower lung. Right lung is clear. No pleural effusion or pneumothorax. The heart is normal in size. Right humeral neck fracture, unchanged. IMPRESSION: Mild atelectasis in the left lower lung. Right femoral neck fracture, unchanged. Electronically Signed   By: Charline BillsSriyesh  Krishnan M.D.   On: 11/04/2019 08:21   DG Chest Portable 1 View  Result Date: 11/01/2019 CLINICAL DATA:  Fall with right shoulder pain EXAM: PORTABLE CHEST 1 VIEW COMPARISON:  09/26/2018 FINDINGS: Low lung volumes with strandy atelectasis and or scarring at the bases. No consolidation or effusion. Normal cardiomediastinal silhouette with aortic atherosclerosis. No pneumothorax. Incompletely visualized  proximal right humerus fracture. IMPRESSION: 1. Low lung volumes with strandy atelectasis and or scarring at the bases. 2. Incompletely visualized proximal right humerus fracture. Electronically Signed   By: Jasmine PangKim  Fujinaga M.D.   On: 11/01/2019 23:36   DG Shoulder Right Portable  Result Date: 11/01/2019 CLINICAL DATA:  Fall, right shoulder pain EXAM: PORTABLE RIGHT SHOULDER COMPARISON:  Chest radiograph 09/26/2018 FINDINGS: Mildly comminuted fracture of the proximal right humerus predominantly extending through the surgical neck with extension into the greater and lesser tuberosities. Associated soft tissue swelling and probable shoulder effusion is present. No other acute osseous injury. The humeral head remains normally located however. Acromioclavicular alignment is maintained. No other acute osseous injury. Remote right rib fractures are similar to comparison radiographs. No acute abnormality in the right chest. IMPRESSION: Mildly comminuted fracture of the proximal right humerus predominantly extending through the surgical neck with extension into  the greater and lesser tuberosities. Right rib fractures appear largely healed, remote. Electronically Signed   By: Kreg Shropshire M.D.   On: 11/01/2019 23:37   DG Humerus Right  Result Date: 11/01/2019 CLINICAL DATA:  Status post fall. EXAM: RIGHT HUMERUS - 2+ VIEW COMPARISON:  None. FINDINGS: Acute, mildly impacted fracture deformity is seen involving the head and surgical neck of the proximal right humerus. There is no evidence of dislocation. Soft tissues are unremarkable. IMPRESSION: Acute fracture of the proximal right humerus. Electronically Signed   By: Aram Candela M.D.   On: 11/01/2019 23:36     The results of significant diagnostics from this hospitalization (including imaging, microbiology, ancillary and laboratory) are listed below for reference.     Microbiology: Recent Results (from the past 240 hour(s))  SARS Coronavirus 2 by RT PCR  (hospital order, performed in [facility] hospital lab) Nasopharyngeal Nasopharyngeal Swab     Status: None   Collection Time: 11/02/19  1:30 AM   Specimen: Nasopharyngeal Swab  Result Value Ref Range Status   SARS Coronavirus 2 NEGATIVE NEGATIVE Final    Comment: (NOTE) SARS-CoV-2 target nucleic acids are NOT DETECTED.  The SARS-CoV-2 RNA is generally detectable in upper and lower respiratory specimens during the acute phase of infection. The lowest concentration of SARS-CoV-2 viral copies this assay can detect is 250 copies / mL. A negative result does not preclude SARS-CoV-2 infection and should not be used as the sole basis for treatment or other patient management decisions.  A negative result may occur with improper specimen collection / handling, submission of specimen other than nasopharyngeal swab, presence of viral mutation(s) within the areas targeted by this assay, and inadequate number of viral copies (<250 copies / mL). A negative result must be combined with clinical observations, patient history, and epidemiological information.  Fact Sheet for Patients:   BoilerBrush.com.cy  Fact Sheet for Healthcare Providers: https://pope.com/  This test is not yet approved or  cleared by the [place] FDA and has been authorized for detection and/or diagnosis of SARS-CoV-2 by FDA under an Emergency Use Authorization (EUA).  This EUA will remain in effect (meaning this test can be used) for the duration of the COVID-19 declaration under Section 564(b)(1) of the Act, 21 U.S.C. section 360bbb-3(b)(1), unless the authorization is terminated or revoked sooner.  Performed at [facility], [address] [place], [place] [address]   Urine culture     Status: Abnormal   Collection Time: 11/02/19 10:39 AM   Specimen: Urine, Catheterized  Result Value Ref Range Status   Specimen Description   Final    URINE,  CATHETERIZED Performed at [facility], [address] [place], [place] [address]    Special Requests   Final    NONE Performed at [facility], [address], [place], [place] [address]    Culture MULTIPLE SPECIES PRESENT, SUGGEST RECOLLECTION (A)  Final   Report Status 11/03/2019 FINAL  Final     Labs: BNP (last 3 results) Recent Labs    11/05/19 0252  BNP 26.6   Basic Metabolic Panel: Recent Labs  Lab 11/03/19 0322 11/04/19 0316 11/05/19 0252 11/06/19 0322 11/07/19 0508  NA 137 138 137 132* 135  K 3.4* 4.0 3.5 3.8 4.0  CL 104 104 104 100 102  CO2 24 23 22 23 24   GLUCOSE 102* 106* 106* 97 103*  BUN 30* 22 18 23  25*  CREATININE 0.66 0.78 0.69 0.73 0.86  CALCIUM 8.7* 8.6* 8.6* 8.4* 8.5*  MG 2.3 2.2 2.0 2.3 2.3   Liver Function Tests: Recent Labs  Lab 11/01/19 2354  AST 11*  ALT 9  ALKPHOS 47  BILITOT 1.3*  PROT 6.7  ALBUMIN 4.2   No results for input(s): LIPASE, AMYLASE in the last 168 hours. No results for input(s): AMMONIA in the last 168 hours. CBC: Recent Labs  Lab 11/01/19 2354 11/02/19 0439  WBC 5.8 7.7  NEUTROABS 2.8  --   HGB 12.5 10.2*  HCT 36.1 30.2*  MCV 89.4 91.2  PLT 159 156   Cardiac Enzymes: No results for input(s): CKTOTAL, CKMB, CKMBINDEX, TROPONINI in the last 168 hours. BNP: Invalid input(s): POCBNP CBG: No results for input(s): GLUCAP in the last 168 hours. D-Dimer No results for input(s): DDIMER in the last 72 hours. Hgb A1c No results for input(s): HGBA1C in the last 72 hours. Lipid Profile No results for input(s): CHOL, HDL, LDLCALC, TRIG, CHOLHDL, LDLDIRECT in the last 72 hours. Thyroid function studies No results for input(s): TSH, T4TOTAL, T3FREE, THYROIDAB in the last 72 hours.  Invalid input(s): FREET3 Anemia work up No results for input(s): VITAMINB12, FOLATE, FERRITIN, TIBC, IRON, RETICCTPCT in the last 72 hours. Urinalysis    Component Value Date/Time   COLORURINE  AMBER (A) 11/02/2019 1039   APPEARANCEUR CLEAR 11/02/2019 1039   LABSPEC 1.014 11/02/2019 1039   PHURINE 6.0 11/02/2019 1039   GLUCOSEU NEGATIVE 11/02/2019 1039   HGBUR SMALL (A) 11/02/2019 1039   BILIRUBINUR NEGATIVE 11/02/2019 1039   KETONESUR 5 (A) 11/02/2019 1039   PROTEINUR NEGATIVE 11/02/2019 1039   NITRITE NEGATIVE 11/02/2019 1039   LEUKOCYTESUR TRACE (A) 11/02/2019 1039   Sepsis Labs Invalid input(s): PROCALCITONIN,  WBC,  LACTICIDVEN Microbiology Recent Results (from the past 240 hour(s))  SARS Coronavirus 2 by RT PCR (hospital order, performed in [facility] hospital lab) Nasopharyngeal Nasopharyngeal Swab     Status: None   Collection Time: 11/02/19  1:30 AM   Specimen: Nasopharyngeal Swab  Result Value Ref Range Status   SARS Coronavirus 2 NEGATIVE NEGATIVE Final    Comment: (NOTE) SARS-CoV-2 target nucleic acids are NOT DETECTED.  The SARS-CoV-2 RNA is generally detectable in upper and lower respiratory specimens during the acute phase of infection. The lowest concentration of SARS-CoV-2 viral copies this assay can detect is 250 copies / mL. A negative result does not preclude SARS-CoV-2 infection and should not be used as the sole basis for treatment or other patient management decisions.  A negative result may occur with improper specimen collection / handling, submission of specimen other than nasopharyngeal swab, presence of viral mutation(s) within the areas targeted by this assay, and inadequate number of viral copies (<250 copies / mL). A negative result must be combined with clinical observations, patient history, and epidemiological information.  Fact Sheet for Patients:   BoilerBrush.com.cy  Fact Sheet for Healthcare Providers: https://pope.com/  This test is not yet approved or  cleared by the [place] FDA and has been authorized for detection and/or diagnosis of SARS-CoV-2 by FDA under an  Emergency Use Authorization (EUA).  This EUA will remain in effect (meaning this test can be used) for the duration of the COVID-19 declaration under Section 564(b)(1) of the Act, 21 U.S.C. section 360bbb-3(b)(1), unless the authorization is terminated or revoked sooner.  Performed at [facility], [address], [place], [place] [address]   Urine culture     Status: Abnormal   Collection Time: 11/02/19 10:39  AM   Specimen: Urine, Catheterized  Result Value Ref Range Status   Specimen Description   Final    URINE, CATHETERIZED Performed at [facility], [address], [place], [place] [address]    Special Requests   Final    NONE Performed at [facility], [address] [place], [place] [address]    Culture MULTIPLE SPECIES PRESENT, SUGGEST RECOLLECTION (A)  Final   Report Status 11/03/2019 FINAL  Final     Time coordinating discharge:  I have spent 35 minutes face to face with the patient and on the ward discussing the patients care, assessment, plan and disposition with other care givers. >50% of the time was devoted counseling the patient about the risks and benefits of treatment/Discharge disposition and coordinating care.   SIGNED:   Dimple Nanas, MD  [facility] 11/08/2019, 11:29 AM   If 7PM-7AM, please contact night-coverage

## 2019-11-06 LAB — BASIC METABOLIC PANEL
Anion gap: 9 (ref 5–15)
BUN: 23 mg/dL (ref 8–23)
CO2: 23 mmol/L (ref 22–32)
Calcium: 8.4 mg/dL — ABNORMAL LOW (ref 8.9–10.3)
Chloride: 100 mmol/L (ref 98–111)
Creatinine, Ser: 0.73 mg/dL (ref 0.44–1.00)
GFR calc Af Amer: >60 mL/min (ref >60)
GFR calc non Af Amer: >60 mL/min (ref >60)
Glucose, Bld: 97 mg/dL (ref 70–99)
Potassium: 3.8 mmol/L (ref 3.5–5.1)
Sodium: 132 mmol/L — ABNORMAL LOW (ref 135–145)

## 2019-11-06 LAB — MAGNESIUM: Magnesium: 2.3 mg/dL (ref 1.7–2.4)

## 2019-11-06 NOTE — TOC Progression Note (Signed)
Transition of Care [facility]) - Progression Note    Patient Details  Name: Keylen Uzelac MRN: [id number] Date of Birth: 09/02/1940  [facility] of [facility]) CM/SW Contact  Elliot Gault, LCSW Phone Number: 11/06/2019, 9:45 AM  Clinical Narrative:     TOC following. Per MD, pt medically stable for dc. Plan is for dc to [facility] for SNF rehab if insurance provides authorization. Contacted Angie in Admissions at [facility] to inquire on auth status. Angie looked it up and auth is still pending. Provided Angie with TOC phone number to contact when auth determination is received.   Will continue to follow and assist with dc planning.  Expected Discharge Plan: Skilled Nursing Facility Barriers to Discharge: SNF Pending bed offer  Expected Discharge Plan and Services Expected Discharge Plan: Skilled Nursing Facility   Discharge Planning Services: CM Consult Post Acute Care Choice: Skilled Nursing Facility Living arrangements for the past 2 months: Single Family Home Expected Discharge Date: 11/05/19                                     Social Determinants of Health (SDOH) Interventions    Readmission Risk Interventions No flowsheet data found.

## 2019-11-06 NOTE — Progress Notes (Signed)
Patient doing well this morning seen and examined at bedside. No acute events overnight. Vital signs remained stable.  Sling noted in her right arm with some skin ecchymosis and limited range of motion otherwise her exam is unremarkable.  At this time continue management as planned, [facility] team working on SNF placement, pending insurance authorization.  Please call with further questions as necessary.  Stephania Fragmin MD [facility]

## 2019-11-07 LAB — BASIC METABOLIC PANEL
Anion gap: 9 (ref 5–15)
BUN: 25 mg/dL — ABNORMAL HIGH (ref 8–23)
CO2: 24 mmol/L (ref 22–32)
Calcium: 8.5 mg/dL — ABNORMAL LOW (ref 8.9–10.3)
Chloride: 102 mmol/L (ref 98–111)
Creatinine, Ser: 0.86 mg/dL (ref 0.44–1.00)
GFR calc Af Amer: >60 mL/min (ref >60)
GFR calc non Af Amer: >60 mL/min (ref >60)
Glucose, Bld: 103 mg/dL — ABNORMAL HIGH (ref 70–99)
Potassium: 4 mmol/L (ref 3.5–5.1)
Sodium: 135 mmol/L (ref 135–145)

## 2019-11-07 LAB — MAGNESIUM: Magnesium: 2.3 mg/dL (ref 1.7–2.4)

## 2019-11-07 NOTE — Progress Notes (Signed)
Patient seen and examined at bedside this morning, no complaints.  Tells me she feels slightly better in terms of her pain of the right shoulder. Vital signs remained stable.  Right arm is still in sling with some skin ecchymosis and limited range of motion.  Continue management as planned, TOC team is working on placement pending insurance authorization.  Also spoke with patient's daughter this morning, answered all the questions.  Please call with further questions as needed  Stephania Fragmin MD [facility]

## 2019-11-07 NOTE — Progress Notes (Signed)
Occupational Therapy treatment  Patient agreeable to getting OOB, min hand held assist to ambulate to bathroom, max A clothing management and hygiene after voiding and min A for toilet transfer using L UE to pull on grab bar to assist. Patient reports pain with mobility in R shoulder, OT tighten sling for increased support at beginning of session. Patient min  Hand held assist to ambulate from bathroom to recliner, donned O2 and ice pack to R shoulder at end of session.    11/07/19 1400  OT Visit Information  Last OT Received On 11/07/19  Assistance Needed +1  History of Present Illness Patient is an 78 y.o. female with history of Parkinson's disease, hypothyroidism presenting to [facility] after a fall at home when patient felt dizzy.  As per the patient patient has been feeling dizzy for the last a few days particularly when she stands up from a sitting position. Pt reports a fall the evening before admission resulting in pt hitting her head on the floor with residual pain in her right shoulder and some neck pain.  Denies any other weakness.  Denies chest pain or shortness of breath. X-rays in the ER revealed right proximal humerus fracture per ortho limit use of R shoulder and NWB in sling. CT head is unremarkable CT neck unremarkable  Precautions  Precautions Fall  Precaution Comments pt reports this is her only fall in the last 6 months; monitor BP  Required Braces or Orthoses Sling  Pain Assessment  Pain Assessment 0-10  Pain Score 6  Pain Location R shoulder with mobility  Pain Descriptors / Indicators Discomfort;Guarding  Pain Intervention(s) Repositioned;Other (comment);Ice applied (sling)  Cognition  Arousal/Alertness Awake/alert  Behavior During Therapy WFL for tasks assessed/performed  Overall Cognitive Status History of cognitive impairments - at baseline  General Comments patient following directions appropriately  ADL  Overall ADL's  Needs assistance/impaired  Grooming Minimal  assistance;Wash/dry hands;Standing  Grooming Details (indicate cue type and reason) stood at sink, assist to wash/dry  left hand  Lower Body Dressing Maximal assistance;Sitting/lateral leans;Sit to/from stand  Lower Body Dressing Details (indicate cue type and reason) to doff soiled clothing and don clean mesh underwear, patient attempts to assist with pulling clothing over hips with L hand  Toilet Transfer Minimal assistance;Cueing for safety;Ambulation;Regular Toilet;Grab bars  Toilet Transfer Details (indicate cue type and reason) hand held assist x1 to ambulate to/from bathroom, patient able to hold onto grab bar with L UE to assist with sit <> stand  Toileting- Clothing Manipulation and Hygiene Maximal assistance;Sit to/from stand  Toileting - Clothing Manipulation Details (indicate cue type and reason) for peri care, patient able to hold onto grab bar with L UE for stability in standing  Functional mobility during ADLs Minimal assistance (hand held assist)  Bed Mobility  Overal bed mobility Needs Assistance  Bed Mobility Supine to Sit  Supine to sit Min assist  General bed mobility comments cues for sequencing and min A to lift trunk  Balance  Overall balance assessment Needs assistance  Sitting-balance support No upper extremity supported  Sitting balance-Leahy Scale Fair  Standing balance support Single extremity supported  Standing balance-Leahy Scale Poor  Standing balance comment Required HHA  Restrictions  Weight Bearing Restrictions Yes  RUE Weight Bearing NWB  Other Position/Activity Restrictions NWB on Rt shoulder, sling at all times, pt able to move Rt elbow and wrist as tolerated  Transfers  Overall transfer level Needs assistance  Equipment used 1 person hand held assist  Transfers  Sit to/from Stand  Sit to [employer] assist  General transfer comment min A to power up and for steadying assist  General Comments  General comments (skin integrity, edema, etc.) patient  report shortness of breath after activity on room air despite O2 reading 94%, donned 1L at end of session  OT - End of Session  Equipment Utilized During Treatment Oxygen  Activity Tolerance Patient tolerated treatment well  Patient left in chair;with call bell/phone within reach;with chair alarm set  Nurse Communication Mobility status  OT Assessment/Plan  OT Plan Discharge plan remains appropriate  OT Visit Diagnosis Unsteadiness on feet (R26.81);Other abnormalities of gait and mobility (R26.89);History of falling (Z91.81);Pain;Other symptoms and signs involving cognitive function  Pain - Right/Left Right  Pain - part of body Shoulder;Arm  OT Frequency (ACUTE ONLY) Min 2X/week  Follow Up Recommendations SNF;Supervision/Assistance - 24 hour  OT Equipment Other (comment) (TBD)  AM-PAC OT "6 Clicks" Daily Activity Outcome Measure (Version 2)  Help from another person eating meals? 3  Help from another person taking care of personal grooming? 3  Help from another person toileting, which includes using toliet, bedpan, or urinal? 2  Help from another person bathing (including washing, rinsing, drying)? 2  Help from another person to put on and taking off regular upper body clothing? 2  Help from another person to put on and taking off regular lower body clothing? 2  6 Click Score 14  OT Goal Progression  Progress towards OT goals Progressing toward goals  Acute Rehab OT Goals  Patient Stated Goal to get better and be able to go home  OT Goal Formulation With patient/family  Time For Goal Achievement 11/17/19  Potential to Achieve Goals Good  ADL Goals  Pt Will Perform Grooming with min assist;standing (3 tasks EOB)  Pt Will Transfer to Toilet with min guard assist;ambulating;bedside commode (over toilet)  Pt Will Perform Toileting - Clothing Manipulation and hygiene with min assist ( min G assist sit <> stand)  Pt/caregiver will Perform Home Exercise Program Increased ROM;Increased  strength;Right Upper extremity;With written HEP provided (elbow to hand ex with fam assist)  Additional ADL Goal #1 Pt will be S in and OOB for basic ADLs--coming out and in on her left side  OT Time Calculation  OT Start Time (ACUTE ONLY) 1053  OT Stop Time (ACUTE ONLY) 1121  OT Time Calculation (min) 28 min  OT General Charges  $OT Visit 1 Visit  OT Treatments  $Self Care/Home Management  23-37 mins   Marlyce Huge OT OT pager: [phone]

## 2019-11-07 NOTE — Progress Notes (Signed)
Physical Therapy Treatment Patient Details Name: Tami Vega MRN: [id number] DOB: 19-Feb-1941 Today's Date: 11/07/2019    History of Present Illness Patient is an 79 y.o. female with history of Parkinson's disease, hypothyroidism presenting to [facility] after a fall at home when patient felt dizzy.  As per the patient patient has been feeling dizzy for the last a few days particularly when she stands up from a sitting position. Pt reports a fall the evening before admission resulting in pt hitting her head on the floor with residual pain in her right shoulder and some neck pain.  Denies any other weakness.  Denies chest pain or shortness of breath. X-rays in the ER revealed right proximal humerus fracture per ortho limit use of R shoulder and NWB in sling. CT head is unremarkable CT neck unremarkable    PT Comments    Patient progressing well with acute PT and BP remained stable with mobility this session. Patient initiated gait training with [facility] today and required min assist to steady and cues for sequencing steps with cane. Patient performed 5x sit<>stand from recliner for functional LE strengthening with min assist to steady. Acute PT will continue to follow as able, continue to recommend SNF placement for rehab follow up.    Follow Up Recommendations  SNF;Supervision/Assistance - 24 hour     Equipment Recommendations   (TBA)    Recommendations for Other Services       Precautions / Restrictions Precautions Precautions: Fall Precaution Comments: pt reports this is her only fall in the last 6 months; monitor BP Required Braces or Orthoses: Sling Restrictions Weight Bearing Restrictions: Yes RUE Weight Bearing: Non weight bearing Other Position/Activity Restrictions: NWB on Rt shoulder, sling at all times, pt able to move Rt elbow and wrist as tolerated    Mobility  Bed Mobility Overal bed mobility: Needs Assistance Bed Mobility: Supine to Sit     Supine to sit: Min assist      General bed mobility comments: pt OOB in recliner at start and end of session.   Transfers Overall transfer level: Needs assistance Equipment used: Quad cane Transfers: Sit to/from Stand Sit to Stand: Min assist         General transfer comment: Min assist ofr power up and steady with transition to quad cane. 5x from recliner during session.  Ambulation/Gait Ambulation/Gait assistance: Min assist Gait Distance (Feet): 18 Feet (8 ft fwd/bkwd, and 10 ft) Assistive device: Quad cane Gait Pattern/deviations: Decreased stride length;Trunk flexed Gait velocity: decr   General Gait Details: cues for use of quad cane in Lt UE and min assist to steady throughout. pt with shaking/bobbing head during mobility. no c/o of dizziness with mobility.    Stairs             Wheelchair Mobility    Modified Rankin (Stroke Patients Only)       Balance Overall balance assessment: Needs assistance Sitting-balance support: No upper extremity supported Sitting balance-Leahy Scale: Fair     Standing balance support: Single extremity supported Standing balance-Leahy Scale: Poor Standing balance comment: Required HHA         Cognition Arousal/Alertness: Awake/alert Behavior During Therapy: WFL for tasks assessed/performed Overall Cognitive Status: History of cognitive impairments - at baseline          General Comments: Pt with improved orientation today. following directions well, oriented to self, place, situation, and month/year.       Exercises      General Comments General comments (skin integrity,  edema, etc.): VSS throughout mobility. Sitting BP 109/96 with HR of 73 bpm and SpO2 of 97% on 1L/min; Standing BP 108/90 with HR of 84 bpm and SpO2 of 93% on RA. Pt's amb on RA and sats stayed between 92-97%.       Pertinent Vitals/Pain Pain Assessment: Faces Pain Score: 6  Faces Pain Scale: Hurts a little bit Pain Location: R shoulder with mobility Pain Descriptors /  Indicators: Discomfort Pain Intervention(s): Repositioned;Monitored during session           PT Goals (current goals can now be found in the care plan section) Acute Rehab PT Goals Patient Stated Goal: to get better and be able to go home PT Goal Formulation: With patient/family Time For Goal Achievement: 11/16/19 Potential to Achieve Goals: Fair Progress towards PT goals: Progressing toward goals    Frequency    Min 2X/week      PT Plan Current plan remains appropriate       AM-PAC PT "6 Clicks" Mobility   Outcome Measure  Help needed turning from your back to your side while in a flat bed without using bedrails?: A Little Help needed moving from lying on your back to sitting on the side of a flat bed without using bedrails?: A Little Help needed moving to and from a bed to a chair (including a wheelchair)?: A Little Help needed standing up from a chair using your arms (e.g., wheelchair or bedside chair)?: A Little Help needed to walk in hospital room?: A Little Help needed climbing 3-5 steps with a railing? : A Lot 6 Click Score: 17    End of Session Equipment Utilized During Treatment: Gait belt Activity Tolerance: Patient tolerated treatment well Patient left: with chair alarm set;in chair;with call bell/phone within reach Nurse Communication: Mobility status PT Visit Diagnosis: Muscle weakness (generalized) (M62.81);Other abnormalities of gait and mobility (R26.89);Difficulty in walking, not elsewhere classified (R26.2);Pain Pain - Right/Left: Right Pain - part of body: Shoulder     Time: 6010-9323 PT Time Calculation (min) (ACUTE ONLY): 29 min  Charges:  $Gait Training: 8-22 mins $Therapeutic Activity: 8-22 mins                     Wynn Maudlin, DPT Acute Rehabilitation Services  Office [phone] Pager [phone]  11/07/2019 3:24 PM

## 2019-11-08 DIAGNOSIS — S42201A Unspecified fracture of upper end of right humerus, initial encounter for closed fracture: Secondary | ICD-10-CM | POA: Diagnosis not present

## 2019-11-08 DIAGNOSIS — R2681 Unsteadiness on feet: Secondary | ICD-10-CM | POA: Diagnosis not present

## 2019-11-08 DIAGNOSIS — Z743 Need for continuous supervision: Secondary | ICD-10-CM | POA: Diagnosis not present

## 2019-11-08 DIAGNOSIS — R5381 Other malaise: Secondary | ICD-10-CM | POA: Diagnosis not present

## 2019-11-08 DIAGNOSIS — R262 Difficulty in walking, not elsewhere classified: Secondary | ICD-10-CM | POA: Diagnosis not present

## 2019-11-08 DIAGNOSIS — S42294D Other nondisplaced fracture of upper end of right humerus, subsequent encounter for fracture with routine healing: Secondary | ICD-10-CM | POA: Diagnosis not present

## 2019-11-08 DIAGNOSIS — G20 Parkinson's disease: Secondary | ICD-10-CM | POA: Diagnosis not present

## 2019-11-08 DIAGNOSIS — E7800 Pure hypercholesterolemia, unspecified: Secondary | ICD-10-CM | POA: Diagnosis not present

## 2019-11-08 DIAGNOSIS — R69 Illness, unspecified: Secondary | ICD-10-CM | POA: Diagnosis not present

## 2019-11-08 DIAGNOSIS — E039 Hypothyroidism, unspecified: Secondary | ICD-10-CM | POA: Diagnosis not present

## 2019-11-08 DIAGNOSIS — R0902 Hypoxemia: Secondary | ICD-10-CM | POA: Diagnosis not present

## 2019-11-08 DIAGNOSIS — E559 Vitamin D deficiency, unspecified: Secondary | ICD-10-CM | POA: Diagnosis not present

## 2019-11-08 DIAGNOSIS — W19XXXD Unspecified fall, subsequent encounter: Secondary | ICD-10-CM | POA: Diagnosis not present

## 2019-11-08 DIAGNOSIS — R279 Unspecified lack of coordination: Secondary | ICD-10-CM | POA: Diagnosis not present

## 2019-11-08 DIAGNOSIS — M810 Age-related osteoporosis without current pathological fracture: Secondary | ICD-10-CM | POA: Diagnosis not present

## 2019-11-08 DIAGNOSIS — S42201D Unspecified fracture of upper end of right humerus, subsequent encounter for fracture with routine healing: Secondary | ICD-10-CM | POA: Diagnosis not present

## 2019-11-08 DIAGNOSIS — R42 Dizziness and giddiness: Secondary | ICD-10-CM | POA: Diagnosis not present

## 2019-11-08 NOTE — Progress Notes (Signed)
Patient seen and examined at bedside, no complaints.  She is very eager to start physical therapy at this time.  She is aware that currently we are waiting on placement otherwise is medically stable. Vital signs remained stable.  RN and [facility] staff has been updated.  No further change in management.  Discharge summary completed on 11/05/2019.  Call with further questions if needed  Stephania Fragmin MD [facility]

## 2019-11-08 NOTE — Progress Notes (Signed)
Went over discharge instructions w/ pt.  Called report to [facility] in [place] and gave report to Therapist, sports.

## 2019-11-08 NOTE — TOC Transition Note (Signed)
Transition of Care [facility]) - CM/SW Discharge Note   Patient Details  Name: Tami Vega MRN: [id number] Date of Birth: [birth year]  Transition of Care [facility]) CM/SW Contact:  Ida Rogue, LCSW Phone Number: 11/08/2019, 9:52 AM   Clinical Narrative:   Patient to transfer to [facility] in [place].  PTAR arranged.  Nursing, please call report to [phone], bed 208. TOC sign off.    Final next level of care: Skilled Nursing Facility Barriers to Discharge: Barriers Resolved   Patient Goals and CMS Choice Patient states their goals for this hospitalization and ongoing recovery are:: "I think I had a fall, I'm not sure what happened."   Choice offered to / list presented to : Patient, Adult Children  Discharge Placement                       Discharge Plan and Services   Discharge Planning Services: CM Consult Post Acute Care Choice: Skilled Nursing Facility                               Social Determinants of Health (SDOH) Interventions     Readmission Risk Interventions No flowsheet data found.

## 2019-11-11 DIAGNOSIS — M810 Age-related osteoporosis without current pathological fracture: Secondary | ICD-10-CM | POA: Diagnosis not present

## 2019-11-11 DIAGNOSIS — S42201D Unspecified fracture of upper end of right humerus, subsequent encounter for fracture with routine healing: Secondary | ICD-10-CM | POA: Diagnosis not present

## 2019-11-11 DIAGNOSIS — E039 Hypothyroidism, unspecified: Secondary | ICD-10-CM | POA: Diagnosis not present

## 2019-11-11 DIAGNOSIS — R262 Difficulty in walking, not elsewhere classified: Secondary | ICD-10-CM | POA: Diagnosis not present

## 2019-11-22 DIAGNOSIS — S42201A Unspecified fracture of upper end of right humerus, initial encounter for closed fracture: Secondary | ICD-10-CM | POA: Diagnosis not present

## 2019-11-23 ENCOUNTER — Other Ambulatory Visit: Payer: Self-pay | Admitting: Neurology

## 2019-11-23 DIAGNOSIS — G20 Parkinson's disease: Secondary | ICD-10-CM

## 2019-11-26 DIAGNOSIS — D649 Anemia, unspecified: Secondary | ICD-10-CM | POA: Diagnosis not present

## 2019-11-26 DIAGNOSIS — M80021D Age-related osteoporosis with current pathological fracture, right humerus, subsequent encounter for fracture with routine healing: Secondary | ICD-10-CM | POA: Diagnosis not present

## 2019-11-26 DIAGNOSIS — Z9181 History of falling: Secondary | ICD-10-CM | POA: Diagnosis not present

## 2019-11-26 DIAGNOSIS — E785 Hyperlipidemia, unspecified: Secondary | ICD-10-CM | POA: Diagnosis not present

## 2019-11-26 DIAGNOSIS — R262 Difficulty in walking, not elsewhere classified: Secondary | ICD-10-CM | POA: Diagnosis not present

## 2019-11-26 DIAGNOSIS — G20 Parkinson's disease: Secondary | ICD-10-CM | POA: Diagnosis not present

## 2019-11-26 DIAGNOSIS — R69 Illness, unspecified: Secondary | ICD-10-CM | POA: Diagnosis not present

## 2019-11-26 DIAGNOSIS — E039 Hypothyroidism, unspecified: Secondary | ICD-10-CM | POA: Diagnosis not present

## 2019-11-27 DIAGNOSIS — Z9181 History of falling: Secondary | ICD-10-CM | POA: Diagnosis not present

## 2019-11-27 DIAGNOSIS — G20 Parkinson's disease: Secondary | ICD-10-CM | POA: Diagnosis not present

## 2019-11-27 DIAGNOSIS — R262 Difficulty in walking, not elsewhere classified: Secondary | ICD-10-CM | POA: Diagnosis not present

## 2019-11-27 DIAGNOSIS — E785 Hyperlipidemia, unspecified: Secondary | ICD-10-CM | POA: Diagnosis not present

## 2019-11-27 DIAGNOSIS — D649 Anemia, unspecified: Secondary | ICD-10-CM | POA: Diagnosis not present

## 2019-11-27 DIAGNOSIS — R69 Illness, unspecified: Secondary | ICD-10-CM | POA: Diagnosis not present

## 2019-11-27 DIAGNOSIS — M80021D Age-related osteoporosis with current pathological fracture, right humerus, subsequent encounter for fracture with routine healing: Secondary | ICD-10-CM | POA: Diagnosis not present

## 2019-11-27 DIAGNOSIS — E039 Hypothyroidism, unspecified: Secondary | ICD-10-CM | POA: Diagnosis not present

## 2019-11-30 DIAGNOSIS — M80021D Age-related osteoporosis with current pathological fracture, right humerus, subsequent encounter for fracture with routine healing: Secondary | ICD-10-CM | POA: Diagnosis not present

## 2019-11-30 DIAGNOSIS — G20 Parkinson's disease: Secondary | ICD-10-CM | POA: Diagnosis not present

## 2019-11-30 DIAGNOSIS — E785 Hyperlipidemia, unspecified: Secondary | ICD-10-CM | POA: Diagnosis not present

## 2019-11-30 DIAGNOSIS — D649 Anemia, unspecified: Secondary | ICD-10-CM | POA: Diagnosis not present

## 2019-11-30 DIAGNOSIS — Z9181 History of falling: Secondary | ICD-10-CM | POA: Diagnosis not present

## 2019-11-30 DIAGNOSIS — R69 Illness, unspecified: Secondary | ICD-10-CM | POA: Diagnosis not present

## 2019-11-30 DIAGNOSIS — E039 Hypothyroidism, unspecified: Secondary | ICD-10-CM | POA: Diagnosis not present

## 2019-11-30 DIAGNOSIS — R262 Difficulty in walking, not elsewhere classified: Secondary | ICD-10-CM | POA: Diagnosis not present

## 2019-12-02 DIAGNOSIS — R69 Illness, unspecified: Secondary | ICD-10-CM | POA: Diagnosis not present

## 2019-12-02 DIAGNOSIS — D649 Anemia, unspecified: Secondary | ICD-10-CM | POA: Diagnosis not present

## 2019-12-02 DIAGNOSIS — E785 Hyperlipidemia, unspecified: Secondary | ICD-10-CM | POA: Diagnosis not present

## 2019-12-02 DIAGNOSIS — Z9181 History of falling: Secondary | ICD-10-CM | POA: Diagnosis not present

## 2019-12-02 DIAGNOSIS — M80021D Age-related osteoporosis with current pathological fracture, right humerus, subsequent encounter for fracture with routine healing: Secondary | ICD-10-CM | POA: Diagnosis not present

## 2019-12-02 DIAGNOSIS — R262 Difficulty in walking, not elsewhere classified: Secondary | ICD-10-CM | POA: Diagnosis not present

## 2019-12-02 DIAGNOSIS — G20 Parkinson's disease: Secondary | ICD-10-CM | POA: Diagnosis not present

## 2019-12-02 DIAGNOSIS — E039 Hypothyroidism, unspecified: Secondary | ICD-10-CM | POA: Diagnosis not present

## 2019-12-04 DIAGNOSIS — D649 Anemia, unspecified: Secondary | ICD-10-CM | POA: Diagnosis not present

## 2019-12-04 DIAGNOSIS — G20 Parkinson's disease: Secondary | ICD-10-CM | POA: Diagnosis not present

## 2019-12-04 DIAGNOSIS — R69 Illness, unspecified: Secondary | ICD-10-CM | POA: Diagnosis not present

## 2019-12-04 DIAGNOSIS — M80021D Age-related osteoporosis with current pathological fracture, right humerus, subsequent encounter for fracture with routine healing: Secondary | ICD-10-CM | POA: Diagnosis not present

## 2019-12-04 DIAGNOSIS — E785 Hyperlipidemia, unspecified: Secondary | ICD-10-CM | POA: Diagnosis not present

## 2019-12-04 DIAGNOSIS — Z9181 History of falling: Secondary | ICD-10-CM | POA: Diagnosis not present

## 2019-12-04 DIAGNOSIS — E039 Hypothyroidism, unspecified: Secondary | ICD-10-CM | POA: Diagnosis not present

## 2019-12-04 DIAGNOSIS — R262 Difficulty in walking, not elsewhere classified: Secondary | ICD-10-CM | POA: Diagnosis not present

## 2019-12-05 DIAGNOSIS — E039 Hypothyroidism, unspecified: Secondary | ICD-10-CM | POA: Diagnosis not present

## 2019-12-05 DIAGNOSIS — M80021D Age-related osteoporosis with current pathological fracture, right humerus, subsequent encounter for fracture with routine healing: Secondary | ICD-10-CM | POA: Diagnosis not present

## 2019-12-05 DIAGNOSIS — E785 Hyperlipidemia, unspecified: Secondary | ICD-10-CM | POA: Diagnosis not present

## 2019-12-05 DIAGNOSIS — G20 Parkinson's disease: Secondary | ICD-10-CM | POA: Diagnosis not present

## 2019-12-05 DIAGNOSIS — R69 Illness, unspecified: Secondary | ICD-10-CM | POA: Diagnosis not present

## 2019-12-05 DIAGNOSIS — R262 Difficulty in walking, not elsewhere classified: Secondary | ICD-10-CM | POA: Diagnosis not present

## 2019-12-05 DIAGNOSIS — Z9181 History of falling: Secondary | ICD-10-CM | POA: Diagnosis not present

## 2019-12-05 DIAGNOSIS — D649 Anemia, unspecified: Secondary | ICD-10-CM | POA: Diagnosis not present

## 2019-12-07 DIAGNOSIS — S42201A Unspecified fracture of upper end of right humerus, initial encounter for closed fracture: Secondary | ICD-10-CM | POA: Diagnosis not present

## 2019-12-08 DIAGNOSIS — R69 Illness, unspecified: Secondary | ICD-10-CM | POA: Diagnosis not present

## 2019-12-08 DIAGNOSIS — E785 Hyperlipidemia, unspecified: Secondary | ICD-10-CM | POA: Diagnosis not present

## 2019-12-08 DIAGNOSIS — M80021D Age-related osteoporosis with current pathological fracture, right humerus, subsequent encounter for fracture with routine healing: Secondary | ICD-10-CM | POA: Diagnosis not present

## 2019-12-08 DIAGNOSIS — D649 Anemia, unspecified: Secondary | ICD-10-CM | POA: Diagnosis not present

## 2019-12-08 DIAGNOSIS — R262 Difficulty in walking, not elsewhere classified: Secondary | ICD-10-CM | POA: Diagnosis not present

## 2019-12-08 DIAGNOSIS — G20 Parkinson's disease: Secondary | ICD-10-CM | POA: Diagnosis not present

## 2019-12-08 DIAGNOSIS — Z9181 History of falling: Secondary | ICD-10-CM | POA: Diagnosis not present

## 2019-12-08 DIAGNOSIS — S42354S Nondisplaced comminuted fracture of shaft of humerus, right arm, sequela: Secondary | ICD-10-CM | POA: Diagnosis not present

## 2019-12-08 DIAGNOSIS — K5900 Constipation, unspecified: Secondary | ICD-10-CM | POA: Diagnosis not present

## 2019-12-08 DIAGNOSIS — E039 Hypothyroidism, unspecified: Secondary | ICD-10-CM | POA: Diagnosis not present

## 2019-12-08 DIAGNOSIS — W19XXXS Unspecified fall, sequela: Secondary | ICD-10-CM | POA: Diagnosis not present

## 2019-12-11 DIAGNOSIS — Z9181 History of falling: Secondary | ICD-10-CM | POA: Diagnosis not present

## 2019-12-11 DIAGNOSIS — E785 Hyperlipidemia, unspecified: Secondary | ICD-10-CM | POA: Diagnosis not present

## 2019-12-11 DIAGNOSIS — M80021D Age-related osteoporosis with current pathological fracture, right humerus, subsequent encounter for fracture with routine healing: Secondary | ICD-10-CM | POA: Diagnosis not present

## 2019-12-11 DIAGNOSIS — G20 Parkinson's disease: Secondary | ICD-10-CM | POA: Diagnosis not present

## 2019-12-11 DIAGNOSIS — R69 Illness, unspecified: Secondary | ICD-10-CM | POA: Diagnosis not present

## 2019-12-11 DIAGNOSIS — D649 Anemia, unspecified: Secondary | ICD-10-CM | POA: Diagnosis not present

## 2019-12-11 DIAGNOSIS — E039 Hypothyroidism, unspecified: Secondary | ICD-10-CM | POA: Diagnosis not present

## 2019-12-11 DIAGNOSIS — R262 Difficulty in walking, not elsewhere classified: Secondary | ICD-10-CM | POA: Diagnosis not present

## 2019-12-13 DIAGNOSIS — M80021D Age-related osteoporosis with current pathological fracture, right humerus, subsequent encounter for fracture with routine healing: Secondary | ICD-10-CM | POA: Diagnosis not present

## 2019-12-13 DIAGNOSIS — Z9181 History of falling: Secondary | ICD-10-CM | POA: Diagnosis not present

## 2019-12-13 DIAGNOSIS — D649 Anemia, unspecified: Secondary | ICD-10-CM | POA: Diagnosis not present

## 2019-12-13 DIAGNOSIS — R262 Difficulty in walking, not elsewhere classified: Secondary | ICD-10-CM | POA: Diagnosis not present

## 2019-12-13 DIAGNOSIS — R69 Illness, unspecified: Secondary | ICD-10-CM | POA: Diagnosis not present

## 2019-12-13 DIAGNOSIS — G20 Parkinson's disease: Secondary | ICD-10-CM | POA: Diagnosis not present

## 2019-12-13 DIAGNOSIS — E039 Hypothyroidism, unspecified: Secondary | ICD-10-CM | POA: Diagnosis not present

## 2019-12-13 DIAGNOSIS — E785 Hyperlipidemia, unspecified: Secondary | ICD-10-CM | POA: Diagnosis not present

## 2019-12-18 DIAGNOSIS — D649 Anemia, unspecified: Secondary | ICD-10-CM | POA: Diagnosis not present

## 2019-12-18 DIAGNOSIS — G20 Parkinson's disease: Secondary | ICD-10-CM | POA: Diagnosis not present

## 2019-12-18 DIAGNOSIS — E785 Hyperlipidemia, unspecified: Secondary | ICD-10-CM | POA: Diagnosis not present

## 2019-12-18 DIAGNOSIS — Z9181 History of falling: Secondary | ICD-10-CM | POA: Diagnosis not present

## 2019-12-18 DIAGNOSIS — R69 Illness, unspecified: Secondary | ICD-10-CM | POA: Diagnosis not present

## 2019-12-18 DIAGNOSIS — M80021D Age-related osteoporosis with current pathological fracture, right humerus, subsequent encounter for fracture with routine healing: Secondary | ICD-10-CM | POA: Diagnosis not present

## 2019-12-18 DIAGNOSIS — R262 Difficulty in walking, not elsewhere classified: Secondary | ICD-10-CM | POA: Diagnosis not present

## 2019-12-18 DIAGNOSIS — E039 Hypothyroidism, unspecified: Secondary | ICD-10-CM | POA: Diagnosis not present

## 2019-12-19 DIAGNOSIS — R262 Difficulty in walking, not elsewhere classified: Secondary | ICD-10-CM | POA: Diagnosis not present

## 2019-12-19 DIAGNOSIS — R69 Illness, unspecified: Secondary | ICD-10-CM | POA: Diagnosis not present

## 2019-12-19 DIAGNOSIS — D649 Anemia, unspecified: Secondary | ICD-10-CM | POA: Diagnosis not present

## 2019-12-19 DIAGNOSIS — E039 Hypothyroidism, unspecified: Secondary | ICD-10-CM | POA: Diagnosis not present

## 2019-12-19 DIAGNOSIS — G20 Parkinson's disease: Secondary | ICD-10-CM | POA: Diagnosis not present

## 2019-12-19 DIAGNOSIS — E785 Hyperlipidemia, unspecified: Secondary | ICD-10-CM | POA: Diagnosis not present

## 2019-12-19 DIAGNOSIS — M80021D Age-related osteoporosis with current pathological fracture, right humerus, subsequent encounter for fracture with routine healing: Secondary | ICD-10-CM | POA: Diagnosis not present

## 2019-12-19 DIAGNOSIS — Z9181 History of falling: Secondary | ICD-10-CM | POA: Diagnosis not present

## 2019-12-21 DIAGNOSIS — E039 Hypothyroidism, unspecified: Secondary | ICD-10-CM | POA: Diagnosis not present

## 2019-12-21 DIAGNOSIS — Z9181 History of falling: Secondary | ICD-10-CM | POA: Diagnosis not present

## 2019-12-21 DIAGNOSIS — E785 Hyperlipidemia, unspecified: Secondary | ICD-10-CM | POA: Diagnosis not present

## 2019-12-21 DIAGNOSIS — D649 Anemia, unspecified: Secondary | ICD-10-CM | POA: Diagnosis not present

## 2019-12-21 DIAGNOSIS — R69 Illness, unspecified: Secondary | ICD-10-CM | POA: Diagnosis not present

## 2019-12-21 DIAGNOSIS — R262 Difficulty in walking, not elsewhere classified: Secondary | ICD-10-CM | POA: Diagnosis not present

## 2019-12-21 DIAGNOSIS — M80021D Age-related osteoporosis with current pathological fracture, right humerus, subsequent encounter for fracture with routine healing: Secondary | ICD-10-CM | POA: Diagnosis not present

## 2019-12-21 DIAGNOSIS — G20 Parkinson's disease: Secondary | ICD-10-CM | POA: Diagnosis not present

## 2019-12-25 DIAGNOSIS — Z9181 History of falling: Secondary | ICD-10-CM | POA: Diagnosis not present

## 2019-12-25 DIAGNOSIS — E039 Hypothyroidism, unspecified: Secondary | ICD-10-CM | POA: Diagnosis not present

## 2019-12-25 DIAGNOSIS — R262 Difficulty in walking, not elsewhere classified: Secondary | ICD-10-CM | POA: Diagnosis not present

## 2019-12-25 DIAGNOSIS — G20 Parkinson's disease: Secondary | ICD-10-CM | POA: Diagnosis not present

## 2019-12-25 DIAGNOSIS — E785 Hyperlipidemia, unspecified: Secondary | ICD-10-CM | POA: Diagnosis not present

## 2019-12-25 DIAGNOSIS — D649 Anemia, unspecified: Secondary | ICD-10-CM | POA: Diagnosis not present

## 2019-12-25 DIAGNOSIS — R69 Illness, unspecified: Secondary | ICD-10-CM | POA: Diagnosis not present

## 2019-12-25 DIAGNOSIS — M80021D Age-related osteoporosis with current pathological fracture, right humerus, subsequent encounter for fracture with routine healing: Secondary | ICD-10-CM | POA: Diagnosis not present

## 2019-12-28 DIAGNOSIS — R69 Illness, unspecified: Secondary | ICD-10-CM | POA: Diagnosis not present

## 2019-12-28 DIAGNOSIS — G20 Parkinson's disease: Secondary | ICD-10-CM | POA: Diagnosis not present

## 2019-12-28 DIAGNOSIS — E785 Hyperlipidemia, unspecified: Secondary | ICD-10-CM | POA: Diagnosis not present

## 2019-12-28 DIAGNOSIS — D649 Anemia, unspecified: Secondary | ICD-10-CM | POA: Diagnosis not present

## 2019-12-28 DIAGNOSIS — E039 Hypothyroidism, unspecified: Secondary | ICD-10-CM | POA: Diagnosis not present

## 2019-12-28 DIAGNOSIS — M80021D Age-related osteoporosis with current pathological fracture, right humerus, subsequent encounter for fracture with routine healing: Secondary | ICD-10-CM | POA: Diagnosis not present

## 2019-12-28 DIAGNOSIS — Z9181 History of falling: Secondary | ICD-10-CM | POA: Diagnosis not present

## 2019-12-28 DIAGNOSIS — R262 Difficulty in walking, not elsewhere classified: Secondary | ICD-10-CM | POA: Diagnosis not present

## 2020-01-03 DIAGNOSIS — E785 Hyperlipidemia, unspecified: Secondary | ICD-10-CM | POA: Diagnosis not present

## 2020-01-03 DIAGNOSIS — R69 Illness, unspecified: Secondary | ICD-10-CM | POA: Diagnosis not present

## 2020-01-03 DIAGNOSIS — E039 Hypothyroidism, unspecified: Secondary | ICD-10-CM | POA: Diagnosis not present

## 2020-01-03 DIAGNOSIS — R262 Difficulty in walking, not elsewhere classified: Secondary | ICD-10-CM | POA: Diagnosis not present

## 2020-01-03 DIAGNOSIS — Z9181 History of falling: Secondary | ICD-10-CM | POA: Diagnosis not present

## 2020-01-03 DIAGNOSIS — G20 Parkinson's disease: Secondary | ICD-10-CM | POA: Diagnosis not present

## 2020-01-03 DIAGNOSIS — D649 Anemia, unspecified: Secondary | ICD-10-CM | POA: Diagnosis not present

## 2020-01-03 DIAGNOSIS — M80021D Age-related osteoporosis with current pathological fracture, right humerus, subsequent encounter for fracture with routine healing: Secondary | ICD-10-CM | POA: Diagnosis not present

## 2020-01-04 DIAGNOSIS — Z9181 History of falling: Secondary | ICD-10-CM | POA: Diagnosis not present

## 2020-01-04 DIAGNOSIS — G20 Parkinson's disease: Secondary | ICD-10-CM | POA: Diagnosis not present

## 2020-01-04 DIAGNOSIS — R69 Illness, unspecified: Secondary | ICD-10-CM | POA: Diagnosis not present

## 2020-01-04 DIAGNOSIS — E785 Hyperlipidemia, unspecified: Secondary | ICD-10-CM | POA: Diagnosis not present

## 2020-01-04 DIAGNOSIS — E039 Hypothyroidism, unspecified: Secondary | ICD-10-CM | POA: Diagnosis not present

## 2020-01-04 DIAGNOSIS — D649 Anemia, unspecified: Secondary | ICD-10-CM | POA: Diagnosis not present

## 2020-01-04 DIAGNOSIS — R262 Difficulty in walking, not elsewhere classified: Secondary | ICD-10-CM | POA: Diagnosis not present

## 2020-01-04 DIAGNOSIS — M80021D Age-related osteoporosis with current pathological fracture, right humerus, subsequent encounter for fracture with routine healing: Secondary | ICD-10-CM | POA: Diagnosis not present

## 2020-01-08 DIAGNOSIS — Z0000 Encounter for general adult medical examination without abnormal findings: Secondary | ICD-10-CM | POA: Diagnosis not present

## 2020-01-08 DIAGNOSIS — M65351 Trigger finger, right little finger: Secondary | ICD-10-CM | POA: Diagnosis not present

## 2020-01-08 DIAGNOSIS — M65342 Trigger finger, left ring finger: Secondary | ICD-10-CM | POA: Diagnosis not present

## 2020-01-08 DIAGNOSIS — Z6824 Body mass index (BMI) 24.0-24.9, adult: Secondary | ICD-10-CM | POA: Diagnosis not present

## 2020-01-08 DIAGNOSIS — Z23 Encounter for immunization: Secondary | ICD-10-CM | POA: Diagnosis not present

## 2020-01-10 DIAGNOSIS — E785 Hyperlipidemia, unspecified: Secondary | ICD-10-CM | POA: Diagnosis not present

## 2020-01-10 DIAGNOSIS — H4749 Disorders of optic chiasm in (due to) other disorders: Secondary | ICD-10-CM | POA: Diagnosis not present

## 2020-01-10 DIAGNOSIS — E039 Hypothyroidism, unspecified: Secondary | ICD-10-CM | POA: Diagnosis not present

## 2020-01-10 DIAGNOSIS — R262 Difficulty in walking, not elsewhere classified: Secondary | ICD-10-CM | POA: Diagnosis not present

## 2020-01-10 DIAGNOSIS — Z961 Presence of intraocular lens: Secondary | ICD-10-CM | POA: Diagnosis not present

## 2020-01-10 DIAGNOSIS — D649 Anemia, unspecified: Secondary | ICD-10-CM | POA: Diagnosis not present

## 2020-01-10 DIAGNOSIS — H3550 Unspecified hereditary retinal dystrophy: Secondary | ICD-10-CM | POA: Diagnosis not present

## 2020-01-10 DIAGNOSIS — M80021D Age-related osteoporosis with current pathological fracture, right humerus, subsequent encounter for fracture with routine healing: Secondary | ICD-10-CM | POA: Diagnosis not present

## 2020-01-10 DIAGNOSIS — Z9181 History of falling: Secondary | ICD-10-CM | POA: Diagnosis not present

## 2020-01-10 DIAGNOSIS — H52223 Regular astigmatism, bilateral: Secondary | ICD-10-CM | POA: Diagnosis not present

## 2020-01-10 DIAGNOSIS — R69 Illness, unspecified: Secondary | ICD-10-CM | POA: Diagnosis not present

## 2020-01-10 DIAGNOSIS — G20 Parkinson's disease: Secondary | ICD-10-CM | POA: Diagnosis not present

## 2020-01-16 DIAGNOSIS — R262 Difficulty in walking, not elsewhere classified: Secondary | ICD-10-CM | POA: Diagnosis not present

## 2020-01-16 DIAGNOSIS — D649 Anemia, unspecified: Secondary | ICD-10-CM | POA: Diagnosis not present

## 2020-01-16 DIAGNOSIS — R69 Illness, unspecified: Secondary | ICD-10-CM | POA: Diagnosis not present

## 2020-01-16 DIAGNOSIS — E785 Hyperlipidemia, unspecified: Secondary | ICD-10-CM | POA: Diagnosis not present

## 2020-01-16 DIAGNOSIS — G20 Parkinson's disease: Secondary | ICD-10-CM | POA: Diagnosis not present

## 2020-01-16 DIAGNOSIS — M80021D Age-related osteoporosis with current pathological fracture, right humerus, subsequent encounter for fracture with routine healing: Secondary | ICD-10-CM | POA: Diagnosis not present

## 2020-01-16 DIAGNOSIS — E039 Hypothyroidism, unspecified: Secondary | ICD-10-CM | POA: Diagnosis not present

## 2020-01-16 DIAGNOSIS — Z9181 History of falling: Secondary | ICD-10-CM | POA: Diagnosis not present

## 2020-01-18 DIAGNOSIS — Z9181 History of falling: Secondary | ICD-10-CM | POA: Diagnosis not present

## 2020-01-18 DIAGNOSIS — E039 Hypothyroidism, unspecified: Secondary | ICD-10-CM | POA: Diagnosis not present

## 2020-01-18 DIAGNOSIS — G20 Parkinson's disease: Secondary | ICD-10-CM | POA: Diagnosis not present

## 2020-01-18 DIAGNOSIS — E785 Hyperlipidemia, unspecified: Secondary | ICD-10-CM | POA: Diagnosis not present

## 2020-01-18 DIAGNOSIS — D649 Anemia, unspecified: Secondary | ICD-10-CM | POA: Diagnosis not present

## 2020-01-18 DIAGNOSIS — M80021D Age-related osteoporosis with current pathological fracture, right humerus, subsequent encounter for fracture with routine healing: Secondary | ICD-10-CM | POA: Diagnosis not present

## 2020-01-18 DIAGNOSIS — R262 Difficulty in walking, not elsewhere classified: Secondary | ICD-10-CM | POA: Diagnosis not present

## 2020-01-18 DIAGNOSIS — R69 Illness, unspecified: Secondary | ICD-10-CM | POA: Diagnosis not present

## 2020-01-19 DIAGNOSIS — Z9181 History of falling: Secondary | ICD-10-CM | POA: Diagnosis not present

## 2020-01-19 DIAGNOSIS — M80021D Age-related osteoporosis with current pathological fracture, right humerus, subsequent encounter for fracture with routine healing: Secondary | ICD-10-CM | POA: Diagnosis not present

## 2020-01-19 DIAGNOSIS — G20 Parkinson's disease: Secondary | ICD-10-CM | POA: Diagnosis not present

## 2020-01-19 DIAGNOSIS — D649 Anemia, unspecified: Secondary | ICD-10-CM | POA: Diagnosis not present

## 2020-01-19 DIAGNOSIS — E039 Hypothyroidism, unspecified: Secondary | ICD-10-CM | POA: Diagnosis not present

## 2020-01-19 DIAGNOSIS — E785 Hyperlipidemia, unspecified: Secondary | ICD-10-CM | POA: Diagnosis not present

## 2020-01-19 DIAGNOSIS — R262 Difficulty in walking, not elsewhere classified: Secondary | ICD-10-CM | POA: Diagnosis not present

## 2020-01-19 DIAGNOSIS — R69 Illness, unspecified: Secondary | ICD-10-CM | POA: Diagnosis not present

## 2020-01-22 DIAGNOSIS — E039 Hypothyroidism, unspecified: Secondary | ICD-10-CM | POA: Diagnosis not present

## 2020-01-22 DIAGNOSIS — G20 Parkinson's disease: Secondary | ICD-10-CM | POA: Diagnosis not present

## 2020-01-22 DIAGNOSIS — M80021D Age-related osteoporosis with current pathological fracture, right humerus, subsequent encounter for fracture with routine healing: Secondary | ICD-10-CM | POA: Diagnosis not present

## 2020-01-22 DIAGNOSIS — R69 Illness, unspecified: Secondary | ICD-10-CM | POA: Diagnosis not present

## 2020-01-22 DIAGNOSIS — R262 Difficulty in walking, not elsewhere classified: Secondary | ICD-10-CM | POA: Diagnosis not present

## 2020-01-22 DIAGNOSIS — D649 Anemia, unspecified: Secondary | ICD-10-CM | POA: Diagnosis not present

## 2020-01-22 DIAGNOSIS — E785 Hyperlipidemia, unspecified: Secondary | ICD-10-CM | POA: Diagnosis not present

## 2020-01-22 DIAGNOSIS — Z9181 History of falling: Secondary | ICD-10-CM | POA: Diagnosis not present

## 2020-01-24 DIAGNOSIS — M80021D Age-related osteoporosis with current pathological fracture, right humerus, subsequent encounter for fracture with routine healing: Secondary | ICD-10-CM | POA: Diagnosis not present

## 2020-01-24 DIAGNOSIS — R262 Difficulty in walking, not elsewhere classified: Secondary | ICD-10-CM | POA: Diagnosis not present

## 2020-01-24 DIAGNOSIS — G20 Parkinson's disease: Secondary | ICD-10-CM | POA: Diagnosis not present

## 2020-01-24 DIAGNOSIS — E039 Hypothyroidism, unspecified: Secondary | ICD-10-CM | POA: Diagnosis not present

## 2020-01-24 DIAGNOSIS — Z9181 History of falling: Secondary | ICD-10-CM | POA: Diagnosis not present

## 2020-01-24 DIAGNOSIS — E785 Hyperlipidemia, unspecified: Secondary | ICD-10-CM | POA: Diagnosis not present

## 2020-01-24 DIAGNOSIS — R69 Illness, unspecified: Secondary | ICD-10-CM | POA: Diagnosis not present

## 2020-01-24 DIAGNOSIS — D649 Anemia, unspecified: Secondary | ICD-10-CM | POA: Diagnosis not present

## 2020-02-12 ENCOUNTER — Encounter: Payer: Self-pay | Admitting: Neurology

## 2020-02-12 ENCOUNTER — Ambulatory Visit: Payer: Medicare HMO | Admitting: Neurology

## 2020-02-12 ENCOUNTER — Other Ambulatory Visit: Payer: Self-pay

## 2020-02-12 VITALS — BP 120/74 | HR 75 | Ht 68.0 in | Wt 165.0 lb

## 2020-02-12 DIAGNOSIS — G20 Parkinson's disease: Secondary | ICD-10-CM

## 2020-02-12 DIAGNOSIS — Z9181 History of falling: Secondary | ICD-10-CM

## 2020-02-12 NOTE — Patient Instructions (Signed)
I am sorry to hear that you had a fall and sustained an injury to the right arm.  As discussed, please use your walker at all times.  Please try to hydrate well with water, 6 to 8 cups of water are generally recommended, 8 ounces each.  We can continue with your medication, you do not need any refills on your Sinemet, Comtan or Azilect at this time.  Please follow-up in clinic in about 3 to 4 months.

## 2020-02-12 NOTE — Progress Notes (Signed)
Subjective:    Patient ID: Tami Vega is a 79 y.o. female.  HPI     Interim history:   Tami Vega is a very pleasant 79 year old right-handed woman with an underlying medical history of hyperlipidemia, history of pituitary microadenoma, precancerous breast lesions status post double mastectomy in the 1980s, who presents for follow-up consultation of her left-sided predominant Parkinson's disease. The patient is accompanied by her sister, Tami Vega today. I last saw her on 10/10/2019, at which time she reported that her Sinemet was not lasting as long.  She was taking 1 pill 4 times a day.  She also had more stress what with her husband's declining health and memory loss.   Today, 02/12/20: She reports doing about the same. Unfortunately, she took a fall in August and broke her proximal right humerus.  She was treated conservatively and was transferred to skilled nursing for rehab.  She then had home health PT and OT.  She feels that her mobility in the right upper extremity is better.  She also went to the emergency room on 10/16/2019 but did not actually stay to be seen.  Her sister reports that that ER visit was prompted by stress.   The patient's allergies, current medications, family history, past medical history, past social history, past surgical history and problem list were reviewed and updated as appropriate.    Previously (copied from previous notes for reference):    I saw her on 04/12/2019, at which time she reported tolerating Comtan.  We mutually agreed to increase it to 1 pill 3 times daily.  She was advised to continue with Sinemet and Azilect and we talked about potentially utilizing inbrija potentially down the road.         I saw her on 12/07/2018, at which time she reported taking Comtan only once daily at 11 AM.  She felt that she was too sedated when she took it twice daily but she was willing to try it again.  I suggest that she added for the 3 PM dose.  We kept her  Sinemet the same as well as the Azilect.          I saw her on 09/05/2018, at which time she reported feeling more slower, she had had a few times of feeling lightheaded upon standing up too quickly.  She had a fall in the kitchen.  She was taking Sinemet 4 times a day.  She was more fatigued by the end of the day.  Constipation was under control.  She had had some stable hallucinations, nothing bothersome.  I suggested a cautious addition of Comtan for 2 of her 4 doses of Sinemet.  She called back in July 2020 indicating that she felt more unsteady since starting the Comtan and she was advised to reduce the Comtan to only once daily with 1 of her Sinemet doses.   I saw her on 03/02/2018, at which time she reported that she was not able to tolerate the Sinemet long-acting.  She had problems with insomnia, she was no longer on it.  We had started it in or around June 2019.  She was generally taking Sinemet 4 times a day.  She was having more difficulty in the mornings in terms of mobility.  She was advised to take a middle of the night dose of Sinemet for total of 5 pills/day, the middle of the night dose anywhere between 11 PM and 4 AM.  We talked about the importance of monitoring her  driving.  She had limited her driving.     I saw her on 08/30/2017, at which time she reported having had one fall in the past 6 months. She fell in the kitchen. She unfortunately cracked a couple of ribs, had an x-ray through PCP. She noticed that she was more stiffer in the mornings and had difficulty moving at times, shuffling more. We talked about the importance of fall prevention and the importance of being proactive about constipation. She was advised to hydrate well with water and continue with Sinemet 1 pill 4 times a day at 7, 11, 3 and 7. I asked her to add Sinemet CR 50-200 milligrams strength at bedtime. She was advised to continue with Azilect once daily as well.   She called a few days after that in the interim  reporting that she felt more sleepy after starting the Sinemet CR. I suggested we could reduce it to 25-100 milligrams strength long-acting but she declined.   I saw her on 02/22/17, at which time she reported doing okay, sometimes she felt lightheaded. She had had a few falls, with a tendency to fall backwards when she stood up too quickly. For her occasional constipation she was using MiraLAX as needed about twice a week. I advised her to continue her current Parkinson's disease medication regimen including Sinemet 1 pill 4 times a day and Azilect 1 pill once daily, talked about fall risk and gait safety.   I saw her on 08/18/2016, at which time she reported increase in tremor on the left side. She had some difficulty with sleep at times. She had not tried any over-the-counter medication. She had a fall about 3 months prior in the yard. Thankfully she did not sustain any major injuries. She did bruise her right thigh. I suggested she try to increase her Sinemet to 1 pill 4 times a day and keep her Azilect at 1 mg once daily. She was advised to try melatonin for sleep.   I saw her on 02/19/2016, at which time she reported overall doing quite well, she was active, sewing and making crafts. She had no recent falls but had noticed lightheadedness particularly upon standing up quickly. She had a near fall experience recently at a restaurant. She was not always hydrating well. Mood and memory were stable. Motor-wise, she felt stable as well. I suggested we continue with her medication regimen, including Azilect and Sinemet.Marland Kitchen She was furthermore advised to stay better hydrated and change positions slowly, also start using compression stockings.   I saw her on 08/12/2015, at which time she reported feeling fairly stable. She was going to the gym about 4 times a week, had lost weight, mostly because of change in taste/lack of taste. Also, she cut out sodas and has been watching what she eats, trying to drink more  water. Overall, motor-wise she felt stable, had mild short-term memory issues but no mood or sinister memory issues, no recent falls. Sometimes her left hand tremor was worse. She has 3 grown children and 3 grandchildren. She was on low-dose Lexapro. I suggested she continue with Azilect once daily and Sinemet 1 pill 3 times a day.   I saw her on 02/11/2015, at which time she reported doing better with the recent addition of Sinemet. She felt she had more energy and mobility was better as well. She needed refills on paper prescriptions because of changing her pharmacies. Her daughter reported that she had stopped taking her antidepressant for a couple of  months but her family had noticed more problems with her depression and anxiety and she restarted her medication. She was trying to stay active. She is trying to drink enough water. She did have additional stressors lately because her son was involved in a car accident and was still in the hospital at the time. Overall, however, she had done fairly well. We mutually agreed to continue her on Azilect 1 mg once daily and Sinemet 1 pill 3 times a day.   I saw her on 10/16/2014, at which time she reported that her tremor was a little worse. She also felt that her walking was worse. She had a harder time picking up her feet, particularly on the left side. Thankfully she had not fallen. She was sleeping a little better since switching her antidepressant to nighttime. I asked her to continue with Azilect once daily but also to add a small dose of Sinemet starting with half a pill twice daily with gradual titration to 1 pill 3 times a day.    I saw her on 04/17/2014, at which time she reported doing a little better with regards to her tremor. She noticed a flareup of her tremor with nervousness. She was still able to use her sewing machine. She had no side effects from her medications. She did find out that she had 2 female cousins on her father's side with Parkinson's  disease. I asked her to continue Azilect 1 mg once daily.   I first met her on 10/20/2013 at the request of her primary care physician, at which time she gave a 6-12 month history of left upper extremity tremor. She also reported problems with fine motor skills on the left. Her history and physical exam are in keeping with mild parkinsonism, probably left eye predominant Parkinson's disease. I suggested further workup with a brain MRI and starting her on Azilect. She did not have a brain scan done.    She has noted L sided issues with fine motor skills, resting tremor on the LUE, she feels easily fatigued and it is difficult for her to pick up her L leg sometimes. Thankfully, she has not fallen, but her balance is off at times. She goes to [employer] 4 times a week, but has less stamina. She is a life long non-smoker, does not drink alcohol and has no history of head injury, exposure to pesticides, but did work in a Emergency planning/management officer for 33 years and she [place]. She has no FHx of PD or tremors.     Her Past Medical History Is Significant For: Past Medical History:  Diagnosis Date  . Abnormal involuntary movements(781.0)   . Disorder of bone and cartilage, unspecified   . Elevated blood pressure reading without diagnosis of hypertension   . Major depressive disorder, single episode, unspecified   . Other musculoskeletal symptoms referable to limbs(729.89)   . Pain in joint, lower leg   . Pure hypercholesterolemia     Her Past Surgical History Is Significant For: Past Surgical History:  Procedure Laterality Date  . BREAST SURGERY  1980's   benign cyst  . COLONOSCOPY  1995, 1998  . VAGINAL HYSTERECTOMY  1975    Her Family History Is Significant For: Family History  Problem Relation Age of Onset  . CAD Mother   . Breast cancer Mother   . CAD Father   . CAD Brother   . Breast cancer Maternal Aunt   . Colon cancer Maternal Aunt   .  Hyperlipidemia Sister   . Hypertension Sister      Her Social History Is Significant For: Social History   Socioeconomic History  . Marital status: Married    Spouse name: Not on file  . Number of children: Not on file  . Years of education: Not on file  . Highest education level: Not on file  Occupational History  . Not on file  Tobacco Use  . Smoking status: Never Smoker  . Smokeless tobacco: Never Used  Substance and Sexual Activity  . Alcohol use: No    Alcohol/week: 0.0 standard drinks  . Drug use: No  . Sexual activity: Not on file  Other Topics Concern  . Not on file  Social History Narrative  . Not on file   Social Determinants of Health   Financial Resource Strain:   . Difficulty of Paying Living Expenses: Not on file  Food Insecurity:   . Worried About Charity fundraiser in the Last Year: Not on file  . Ran Out of Food in the Last Year: Not on file  Transportation Needs:   . Lack of Transportation (Medical): Not on file  . Lack of Transportation (Non-Medical): Not on file  Physical Activity:   . Days of Exercise per Week: Not on file  . Minutes of Exercise per Session: Not on file  Stress:   . Feeling of Stress : Not on file  Social Connections:   . Frequency of Communication with Friends and Family: Not on file  . Frequency of Social Gatherings with Friends and Family: Not on file  . Attends Religious Services: Not on file  . Active Member of Clubs or Organizations: Not on file  . Attends Archivist Meetings: Not on file  . Marital Status: Not on file    Her Allergies Are:  No Known Allergies:   Her Current Medications Are:  Outpatient Encounter Medications as of 02/12/2020  Medication Sig  . b complex vitamins tablet Take 1 tablet by mouth daily.  . Calcium Carbonate-Vit D-Min (CALCIUM 1200 PO) Take by mouth daily. Take one tablet a day  . carbidopa-levodopa (SINEMET IR) 25-100 MG tablet Take 1 tablet by mouth 5 (five) times daily. Take at  7 AM, 10 AM, 1 PM, 4 PM and  7 PM  .  cholecalciferol (VITAMIN D) 25 MCG tablet Take 1 tablet (1,000 Units total) by mouth daily.  . entacapone (COMTAN) 200 MG tablet Take 1 tablet (200 mg total) by mouth 3 (three) times daily. Take with Sinemet dose at 7 AM, 1 PM and 7 PM.  . escitalopram (LEXAPRO) 10 MG tablet Take 10 mg by mouth daily.  . fish oil-omega-3 fatty acids 1000 MG capsule Take 2 g by mouth daily.  Marland Kitchen levothyroxine (SYNTHROID) 88 MCG tablet Take 88 mcg by mouth daily before breakfast.  . melatonin 5 MG TABS Take 5 mg by mouth at bedtime as needed (sleep).   . Multiple Vitamin (MULTIVITAMIN WITH MINERALS) TABS tablet Take 1 tablet by mouth daily.  Marland Kitchen oxyCODONE (OXY IR/ROXICODONE) 5 MG immediate release tablet Take 1 tablet (5 mg total) by mouth every 4 (four) hours as needed for moderate pain or severe pain.  . polyethylene glycol (MIRALAX / GLYCOLAX) 17 g packet Take 17 g by mouth daily as needed.  . rasagiline (AZILECT) 1 MG TABS tablet TAKE 1 TABLET BY MOUTH EVERY DAY  . senna-docusate (SENOKOT-S) 8.6-50 MG tablet Take 2 tablets by mouth at bedtime as needed for mild  constipation or moderate constipation.   No facility-administered encounter medications on file as of 02/12/2020.  :  Review of Systems:  Out of a complete 14 point review of systems, all are reviewed and negative with the exception of these symptoms as listed below: Review of Systems  Neurological:       Pt presents today to follow up on her PD. Pt had a fall in August.    Objective:  Neurological Exam  Physical Exam Physical Examination:   Vitals:   02/12/20 1038  BP: 120/74  Pulse: 75    General Examination: The patient is a very pleasant 79 y.o. female in no acute distress. She appears somewhat frail and deconditioned.  She is well-groomed.  Better spirits than last time. HEENT:Normocephalic, atraumatic, pupils are equal, round and reactive to light. Corrective eyeglasses in place,bilateral cataracts noted.Extraocular tracking shows  saccadic breakdown without nystagmus noted. There is limitation to upper gaze. There is mild decrease in eye blink rate. Hearing is impaired. Face is symmetric withmoderatefacial masking and normal facial sensation. There is no lip, neck or jaw tremor. Neck is moderately rigid withdecreased ROM and more stooped neck. Oropharynx exam revealsmildmouth dryness. No significant airway crowding is noted. Mallampati is class II. Tongue protrudes centrally and palate elevates symmetrically. There is no drooling.   Chest:is clear to auscultation without wheezing, rhonchi or crackles noted.  Heart:sounds are regular and normal without murmurs, rubs or gallops noted.   Abdomen:is soft, non-tender and non-distended with normal bowel sounds appreciated on auscultation.  Extremities:There isnopitting edema in the distal lower extremities bilaterally.  Skin: is warm and dry with no trophic changes noted. Age-related changes are noted on the skin.  Musculoskeletal: exam reveals no obvious joint deformities, tenderness, joint swelling or erythema.  Neurologically:  Mental status: The patient is awake and alert, paying goodattention,  at times slower to respond, speech is hypophonic, memory mildly impaired, sister also provide some details of her history.   Mild degree of bradyphrenia.  Slight dysarthria noted. Affect better today.  Cranial nerves are as described above under HEENT exam.  Motor exam: Normal bulk, and strength for age is noted. There aremild generalized dyskinesias today. Tone is more rigid with presence of cogwheeling in the left upper extremity. There is overall moderateto severe bradykinesia. There is no drift or rebound.  There is a mildresting tremor in the left upper extremity and no other resting tremor. Romberg isnot tested d/t safetyconcern.Fine motor skills exam:mild to moderately impaired on the right and moderate to significantly impaired on the left today.    Cerebellar testing shows no dysmetria or intention tremor on finger to nose testing.there is no truncal or gait ataxia.   Sensory exam is intact to light touch in the upper and lower extremities.   Gait, station and balance: She stands up from the seated position with  mild difficulty, requires no assistance.  She walks without a walking aid.  Mild start hesitation, some difficulty turning.  She did not need wheelchair assistance today.  Balance is impaired.   Assessment and Plan:   In summary, Tami Vega is a very pleasant 79 year old female with an underlying medical history of hyperlipidemia, history of pituitary microadenoma, precancerous breast lesions(withs/pdouble mastectomy in the 1980s), who presents for follow-up consultation of her left-sided predominant Parkinson's disease with symptoms dating back toabout6years ago. She has had fairly stable findings of mild progression over time, significantly more notable progression in the past few months.  Her Parkinson's disease has been  complicated by falls, recent fall with injury to the right humerus in August 2021.  She has also developed dyskinesias and intermittent mild visual hallucinations.  She has also had increase in stress what with her husband's medical issues and memory loss.  She does have a good psychosocial support system and her 2 sons and her sisters are helping out and involved in her care. She started C/Lin July 2016. She is also on Azilect 1 mg once daily, started thisin July 2015. Sinemet was increased in 5/18 to1 pill4times a day. She has hada fall with injury to the ribs.For depression and anxiety she has been maintained on Lexapro 10 mg daily.  She was advised to increase Comtan from 1 pill twice daily to 1 pill 3 times daily in January 2021.  She has been on Sinemet 1 pill 4 times a day and 1 optional pill in the middle of the night.  We increased her Sinemet to 1 pill 5 times daily at our last visit in  June.  She was advised to continue with Comtan 3 times a day at her 7 AM dose, 1 PM dose and 7 PM dose. We started Comtan in June 2020.  She was advised to use her walker at all times and we talked about the importance of fall prevention.  She was advised to continue all her meds, she did not need any refills today, she was advised to follow-up routinely in about 4 months, sooner if needed.  I answered all the questions today the patient and her sister were in agreement. I spent 30 minutes in total face-to-face time and in reviewing records during pre-charting, more than 50% of which was spent in counseling and coordination of care, reviewing test results, reviewing medications and treatment regimen and/or in discussing or reviewing the diagnosis of PD, the prognosis and treatment options. Pertinent laboratory and imaging test results that were available during this visit with the patient were reviewed by me and considered in my medical decision making (see chart for details).

## 2020-02-19 DIAGNOSIS — S42201A Unspecified fracture of upper end of right humerus, initial encounter for closed fracture: Secondary | ICD-10-CM | POA: Diagnosis not present

## 2020-04-08 DIAGNOSIS — G20 Parkinson's disease: Secondary | ICD-10-CM | POA: Diagnosis not present

## 2020-04-08 DIAGNOSIS — R2681 Unsteadiness on feet: Secondary | ICD-10-CM | POA: Diagnosis not present

## 2020-04-08 DIAGNOSIS — R42 Dizziness and giddiness: Secondary | ICD-10-CM | POA: Diagnosis not present

## 2020-04-08 DIAGNOSIS — Z9181 History of falling: Secondary | ICD-10-CM | POA: Diagnosis not present

## 2020-04-08 DIAGNOSIS — W19XXXA Unspecified fall, initial encounter: Secondary | ICD-10-CM | POA: Diagnosis not present

## 2020-04-09 DIAGNOSIS — R8290 Unspecified abnormal findings in urine: Secondary | ICD-10-CM | POA: Diagnosis not present

## 2020-04-09 DIAGNOSIS — R42 Dizziness and giddiness: Secondary | ICD-10-CM | POA: Diagnosis not present

## 2020-04-23 DIAGNOSIS — R296 Repeated falls: Secondary | ICD-10-CM | POA: Diagnosis not present

## 2020-04-23 DIAGNOSIS — R69 Illness, unspecified: Secondary | ICD-10-CM | POA: Diagnosis not present

## 2020-04-23 DIAGNOSIS — N644 Mastodynia: Secondary | ICD-10-CM | POA: Diagnosis not present

## 2020-04-23 DIAGNOSIS — E559 Vitamin D deficiency, unspecified: Secondary | ICD-10-CM | POA: Diagnosis not present

## 2020-04-23 DIAGNOSIS — N390 Urinary tract infection, site not specified: Secondary | ICD-10-CM | POA: Diagnosis not present

## 2020-04-23 DIAGNOSIS — E039 Hypothyroidism, unspecified: Secondary | ICD-10-CM | POA: Diagnosis not present

## 2020-05-17 DIAGNOSIS — R8290 Unspecified abnormal findings in urine: Secondary | ICD-10-CM | POA: Diagnosis not present

## 2020-05-18 DIAGNOSIS — M810 Age-related osteoporosis without current pathological fracture: Secondary | ICD-10-CM | POA: Diagnosis not present

## 2020-05-18 DIAGNOSIS — I951 Orthostatic hypotension: Secondary | ICD-10-CM | POA: Diagnosis not present

## 2020-05-18 DIAGNOSIS — E039 Hypothyroidism, unspecified: Secondary | ICD-10-CM | POA: Diagnosis not present

## 2020-05-18 DIAGNOSIS — E785 Hyperlipidemia, unspecified: Secondary | ICD-10-CM | POA: Diagnosis not present

## 2020-05-18 DIAGNOSIS — N644 Mastodynia: Secondary | ICD-10-CM | POA: Diagnosis not present

## 2020-05-18 DIAGNOSIS — R69 Illness, unspecified: Secondary | ICD-10-CM | POA: Diagnosis not present

## 2020-05-18 DIAGNOSIS — R262 Difficulty in walking, not elsewhere classified: Secondary | ICD-10-CM | POA: Diagnosis not present

## 2020-05-18 DIAGNOSIS — K5900 Constipation, unspecified: Secondary | ICD-10-CM | POA: Diagnosis not present

## 2020-05-18 DIAGNOSIS — N390 Urinary tract infection, site not specified: Secondary | ICD-10-CM | POA: Diagnosis not present

## 2020-05-18 DIAGNOSIS — G20 Parkinson's disease: Secondary | ICD-10-CM | POA: Diagnosis not present

## 2020-05-20 DIAGNOSIS — J309 Allergic rhinitis, unspecified: Secondary | ICD-10-CM | POA: Diagnosis not present

## 2020-05-20 DIAGNOSIS — R350 Frequency of micturition: Secondary | ICD-10-CM | POA: Diagnosis not present

## 2020-05-20 DIAGNOSIS — N390 Urinary tract infection, site not specified: Secondary | ICD-10-CM | POA: Diagnosis not present

## 2020-05-20 DIAGNOSIS — R059 Cough, unspecified: Secondary | ICD-10-CM | POA: Diagnosis not present

## 2020-06-10 ENCOUNTER — Encounter: Payer: Self-pay | Admitting: Neurology

## 2020-06-10 ENCOUNTER — Ambulatory Visit: Payer: Medicare HMO | Admitting: Neurology

## 2020-06-10 VITALS — BP 122/80 | HR 78 | Ht 67.75 in | Wt 163.3 lb

## 2020-06-10 DIAGNOSIS — G20 Parkinson's disease: Secondary | ICD-10-CM

## 2020-06-10 DIAGNOSIS — Y92009 Unspecified place in unspecified non-institutional (private) residence as the place of occurrence of the external cause: Secondary | ICD-10-CM

## 2020-06-10 DIAGNOSIS — G249 Dystonia, unspecified: Secondary | ICD-10-CM

## 2020-06-10 DIAGNOSIS — W19XXXA Unspecified fall, initial encounter: Secondary | ICD-10-CM

## 2020-06-10 NOTE — Progress Notes (Signed)
Subjective:    Patient ID: Tami Vega is a 80 y.o. female.  HPI     Interim history:   Tami Vega is a very pleasant 80 year old right-handed woman with an underlying medical history of hyperlipidemia, history of pituitary microadenoma, precancerous breast lesions status post double mastectomy in the 1980s, who presents for follow-up consultation of her left-sided predominant Parkinson's disease. The patient is accompanied by her sister, Tami Vega, today. I last saw her on 02/12/2020, at which time we talked about the importance of fall prevention.  She had fallen in August 2021 and broke her proximal right humerus.  She was treated conservatively.  She was in rehab and subsequently had home health therapy.  She has ongoing stress.  She was advised to continue her Parkinson's medications.  Today, 06/10/2020: She reports having had some issues with her balance.  She has fallen about 3 times, most recently, she fell on the weekend on the way to her bed, she hit the wood floor with her forehead.  She did not lose consciousness and did not have any headache subsequently but does have some discomfort.  She had a small swelling in the left forehead and a slight remnant of it.  She reports that she has not been drinking water as well as she should.  She was told by her primary care nurse practitioner that she was dehydrated.  She had some blood work through her primary care recently, she is supposed to see Tami Major, NP again in April.  She has ongoing stress, worries about her husband, sometimes she gets frustrated.  She indicates that she continues to take her antidepressant medication.  She is currently in home health PT.  Her youngest son, lives about 5 minutes away, her older son lives about 10 minutes away.  Tami Vega lives about 2 to 3 minutes away and [place] checks in on her typically on Tuesdays and Thursdays.   The patient's allergies, current medications, family history, past medical history,  past social history, past surgical history and problem list were reviewed and updated as appropriate.    Previously (copied from previous notes for reference):   I saw her on 10/10/2019, at which time she reported that her Sinemet was not lasting as long.  She was taking 1 pill 4 times a day.  She also had more stress what with her husband's declining health and memory loss.      I saw her on 04/12/2019, at which time she reported tolerating Comtan.  We mutually agreed to increase it to 1 pill 3 times daily.  She was advised to continue with Sinemet and Azilect and we talked about potentially utilizing inbrija potentially down the road.         I saw her on 12/07/2018, at which time she reported taking Comtan only once daily at 11 AM.  She felt that she was too sedated when she took it twice daily but she was willing to try it again.  I suggest that she added for the 3 PM dose.  We kept her Sinemet the same as well as the Azilect.          I saw her on 09/05/2018, at which time she reported feeling more slower, she had had a few times of feeling lightheaded upon standing up too quickly.  She had a fall in the kitchen.  She was taking Sinemet 4 times a day.  She was more fatigued by the end of the day.  Constipation was under  control.  She had had some stable hallucinations, nothing bothersome.  I suggested a cautious addition of Comtan for 2 of her 4 doses of Sinemet.  She called back in July 2020 indicating that she felt more unsteady since starting the Comtan and she was advised to reduce the Comtan to only once daily with 1 of her Sinemet doses.   I saw her on 03/02/2018, at which time she reported that she was not able to tolerate the Sinemet long-acting.  She had problems with insomnia, she was no longer on it.  We had started it in or around June 2019.  She was generally taking Sinemet 4 times a day.  She was having more difficulty in the mornings in terms of mobility.  She was advised to take a  middle of the night dose of Sinemet for total of 5 pills/day, the middle of the night dose anywhere between 11 PM and 4 AM.  We talked about the importance of monitoring her driving.  She had limited her driving.     I saw her on 08/30/2017, at which time she reported having had one fall in the past 6 months. She fell in the kitchen. She unfortunately cracked a couple of ribs, had an x-ray through PCP. She noticed that she was more stiffer in the mornings and had difficulty moving at times, shuffling more. We talked about the importance of fall prevention and the importance of being proactive about constipation. She was advised to hydrate well with water and continue with Sinemet 1 pill 4 times a day at 7, 11, 3 and 7. I asked her to add Sinemet CR 50-200 milligrams strength at bedtime. She was advised to continue with Azilect once daily as well.   She called a few days after that in the interim reporting that she felt more sleepy after starting the Sinemet CR. I suggested we could reduce it to 25-100 milligrams strength long-acting but she declined.   I saw her on 02/22/17, at which time she reported doing okay, sometimes she felt lightheaded. She had had a few falls, with a tendency to fall backwards when she stood up too quickly. For her occasional constipation she was using MiraLAX as needed about twice a week. I advised her to continue her current Parkinson's disease medication regimen including Sinemet 1 pill 4 times a day and Azilect 1 pill once daily, talked about fall risk and gait safety.   I saw her on 08/18/2016, at which time she reported increase in tremor on the left side. She had some difficulty with sleep at times. She had not tried any over-the-counter medication. She had a fall about 3 months prior in the yard. Thankfully she did not sustain any Vega injuries. She did bruise her right thigh. I suggested she try to increase her Sinemet to 1 pill 4 times a day and keep her Azilect at 1 mg  once daily. She was advised to try melatonin for sleep.   I saw her on 02/19/2016, at which time she reported overall doing quite well, she was active, sewing and making crafts. She had no recent falls but had noticed lightheadedness particularly upon standing up quickly. She had a near fall experience recently at a restaurant. She was not always hydrating well. Mood and memory were stable. Motor-wise, she felt stable as well. I suggested we continue with her medication regimen, including Azilect and Sinemet.Marland Kitchen She was furthermore advised to stay better hydrated and change positions slowly, also start using  compression stockings.   I saw her on 08/12/2015, at which time she reported feeling fairly stable. She was going to the gym about 4 times a week, had lost weight, mostly because of change in taste/lack of taste. Also, she cut out sodas and has been watching what she eats, trying to drink more water. Overall, motor-wise she felt stable, had mild short-term memory issues but no mood or sinister memory issues, no recent falls. Sometimes her left hand tremor was worse. She has 3 grown children and 3 grandchildren. She was on low-dose Lexapro. I suggested she continue with Azilect once daily and Sinemet 1 pill 3 times a day.   I saw her on 02/11/2015, at which time she reported doing better with the recent addition of Sinemet. She felt she had more energy and mobility was better as well. She needed refills on paper prescriptions because of changing her pharmacies. Her daughter reported that she had stopped taking her antidepressant for a couple of months but her family had noticed more problems with her depression and anxiety and she restarted her medication. She was trying to stay active. She is trying to drink enough water. She did have additional stressors lately because her son was involved in a car accident and was still in the hospital at the time. Overall, however, she had done fairly well. We mutually  agreed to continue her on Azilect 1 mg once daily and Sinemet 1 pill 3 times a day.   I saw her on 10/16/2014, at which time she reported that her tremor was a little worse. She also felt that her walking was worse. She had a harder time picking up her feet, particularly on the left side. Thankfully she had not fallen. She was sleeping a little better since switching her antidepressant to nighttime. I asked her to continue with Azilect once daily but also to add a small dose of Sinemet starting with half a pill twice daily with gradual titration to 1 pill 3 times a day.    I saw her on 04/17/2014, at which time she reported doing a little better with regards to her tremor. She noticed a flareup of her tremor with nervousness. She was still able to use her sewing machine. She had no side effects from her medications. She did find out that she had 2 female cousins on her father's side with Parkinson's disease. I asked her to continue Azilect 1 mg once daily.   I first met her on 10/20/2013 at the request of her primary care physician, at which time she gave a 6-12 month history of left upper extremity tremor. She also reported problems with fine motor skills on the left. Her history and physical exam are in keeping with mild parkinsonism, probably left eye predominant Parkinson's disease. I suggested further workup with a brain MRI and starting her on Azilect. She did not have a brain scan done.    She has noted L sided issues with fine motor skills, resting tremor on the LUE, she feels easily fatigued and it is difficult for her to pick up her L leg sometimes. Thankfully, she has not fallen, but her balance is off at times. She goes to [employer] 4 times a week, but has less stamina. She is a life long non-smoker, does not drink alcohol and has no history of head injury, exposure to pesticides, but did work in a Emergency planning/management officer for 33 years and she [place]. She has no FHx of  PD or tremors.    Her Past  Medical History Is Significant For: Past Medical History:  Diagnosis Date  . Abnormal involuntary movements(781.0)   . Disorder of bone and cartilage, unspecified   . Elevated blood pressure reading without diagnosis of hypertension   . Vega depressive disorder, single episode, unspecified   . Other musculoskeletal symptoms referable to limbs(729.89)   . Pain in joint, lower leg   . Pure hypercholesterolemia     Her Past Surgical History Is Significant For: Past Surgical History:  Procedure Laterality Date  . BREAST SURGERY  1980's   benign cyst  . COLONOSCOPY  1995, 1998  . VAGINAL HYSTERECTOMY  1975    Her Family History Is Significant For: Family History  Problem Relation Age of Onset  . CAD Mother   . Breast cancer Mother   . CAD Father   . CAD Brother   . Breast cancer Maternal Aunt   . Colon cancer Maternal Aunt   . Hyperlipidemia Sister   . Hypertension Sister     Her Social History Is Significant For: Social History   Socioeconomic History  . Marital status: Married    Spouse name: Not on file  . Number of children: Not on file  . Years of education: Not on file  . Highest education level: Not on file  Occupational History  . Not on file  Tobacco Use  . Smoking status: Never Smoker  . Smokeless tobacco: Never Used  Substance and Sexual Activity  . Alcohol use: No    Alcohol/week: 0.0 standard drinks  . Drug use: No  . Sexual activity: Not on file  Other Topics Concern  . Not on file  Social History Narrative  . Not on file   Social Determinants of Health   Financial Resource Strain: Not on file  Food Insecurity: Not on file  Transportation Needs: Not on file  Physical Activity: Not on file  Stress: Not on file  Social Connections: Not on file    Her Allergies Are:  No Known Allergies:   Her Current Medications Are:  Outpatient Encounter Medications as of 06/10/2020  Medication Sig  . b complex vitamins tablet Take 1 tablet by mouth  daily.  . Calcium Carbonate-Vit D-Min (CALCIUM 1200 PO) Take by mouth daily. Take one tablet a day  . carbidopa-levodopa (SINEMET IR) 25-100 MG tablet Take 1 tablet by mouth 5 (five) times daily. Take at  7 AM, 10 AM, 1 PM, 4 PM and  7 PM  . cholecalciferol (VITAMIN D) 25 MCG tablet Take 1 tablet (1,000 Units total) by mouth daily.  . D-Mannose 500 MG CAPS Take by mouth. 3 per day  . entacapone (COMTAN) 200 MG tablet Take 1 tablet (200 mg total) by mouth 3 (three) times daily. Take with Sinemet dose at 7 AM, 1 PM and 7 PM.  . fish oil-omega-3 fatty acids 1000 MG capsule Take 2 g by mouth daily.  Marland Kitchen levothyroxine (SYNTHROID) 88 MCG tablet Take 88 mcg by mouth daily before breakfast.  . OMEPRAZOLE PO Take by mouth.  . rasagiline (AZILECT) 1 MG TABS tablet TAKE 1 TABLET BY MOUTH EVERY DAY  . senna-docusate (SENOKOT-S) 8.6-50 MG tablet Take 2 tablets by mouth at bedtime as needed for mild constipation or moderate constipation.  Marland Kitchen escitalopram (LEXAPRO) 10 MG tablet Take 10 mg by mouth daily. (Patient not taking: Reported on 06/10/2020)  . melatonin 5 MG TABS Take 5 mg by mouth at bedtime as needed (sleep).  (  Patient not taking: Reported on 06/10/2020)  . Multiple Vitamin (MULTIVITAMIN WITH MINERALS) TABS tablet Take 1 tablet by mouth daily. (Patient not taking: Reported on 06/10/2020)  . polyethylene glycol (MIRALAX / GLYCOLAX) 17 g packet Take 17 g by mouth daily as needed. (Patient not taking: Reported on 06/10/2020)  . [DISCONTINUED] oxyCODONE (OXY IR/ROXICODONE) 5 MG immediate release tablet Take 1 tablet (5 mg total) by mouth every 4 (four) hours as needed for moderate pain or severe pain.   No facility-administered encounter medications on file as of 06/10/2020.  :  Review of Systems:  Out of a complete 14 point review of systems, all are reviewed and negative with the exception of these symptoms as listed below: Review of Systems  Neurological:       Here for 4 month f/u. Pt reports she has been  doing fairly well. Pt reports 3 falls since last visit. Most recent was Saturday and did bump her head. Pt reports she has not been hydrating well, feels likes fall are related to this.    Objective:  Neurological Exam  Physical Exam Physical Examination:   Vitals:   06/10/20 0930  BP: 122/80  Pulse: 78  SpO2: 95%    General Examination: The patient is a very pleasant 81 y.o. female in no acute distress. She appears well-developed and well-nourished and well groomed.   HEENT:Normocephalic, atraumatic, pupils are equal, round and reactive to light. Corrective eyeglasses in place,bilateral cataracts noted.Extraocular tracking shows saccadic breakdown without nystagmus noted.  She has a mild swelling and redness above the left eyebrow, no obvious laceration, no significant bruise. There is mild decrease in eye blink rate. Hearing is impaired. Face is symmetric withmoderatefacial masking and normal facial sensation. There is no lip, neck or jaw tremor. Neck is moderately rigid withdecreased ROMand more stooped neck. Oropharynx exam revealsmildmouth dryness. No significant airway crowding is noted. Mallampati is class II. Tongue protrudes centrally and palate elevates symmetrically. There is no drooling.   Chest:is clear to auscultation without wheezing, rhonchi or crackles noted.  Heart:sounds are regular and normal without murmurs, rubs or gallops noted.   Abdomen:is soft, non-tender and non-distended with normal bowel sounds appreciated on auscultation.  Extremities:There isnopitting edema in the distal lower extremities bilaterally.  Skin: is warm and dry with no trophic changes noted. Age-related changes are noted on the skin.  Musculoskeletal: exam reveals no obvious joint deformities, tenderness, joint swelling or erythema.  Neurologically:  Mental status: The patient is awake and alert, paying goodattention, at times slower to respond, speech is  hypophonic, memory mildly impaired, sister also provide some details of her history.   Mild degree of bradyphrenia.Slight dysarthria noted. Affect  normal and stable.  Cranial nerves are as described above under HEENT exam.  Motor exam: Normal bulk, and strength for age is noted. There aremild generalized dyskinesias today. Tone ismorerigid with presence of cogwheeling in the left upper extremity. There is overall moderatebradykinesia. There is no drift or rebound.  There is a mildto moderate resting tremor in the left upper extremity and no other resting tremor. Romberg isnot tested d/t safetyconcern.Fine motor skills exam:mildto moderately impaired on the right and moderate to significantly impaired on the left today. Cerebellar testing shows no dysmetria or intention tremor on finger to nose testing.there is no truncal or gait ataxia.   Sensory exam is intact to light touch in the upper and lower extremities.   Gait, station and balance: She stands up from the seated position with mild difficulty,  requires no assistance.  She walks with her walker.  Balance is mildly impaired.   Assessment and Plan:   In summary, Shavaughn Seidl is a very pleasant48 year old female with an underlying medical history of hyperlipidemia, history of pituitary microadenoma, precancerous breast lesions(withs/pdouble mastectomy in the 1980s), who presents for follow-up consultation of her left-sided predominant Parkinson's disease with symptoms dating back toabout6years ago.She has had fairly stable findings with mild progression over time, significantly more notable progression in the past year.  She has had falls.  She had a fall in August 2021 and broke her arm.  She has also developed dyskinesias and intermittent mild visual hallucinations. She has also had increase in stress what with her husband's medical issues and memory loss. She does have a good psychosocial support system and her 2  sons and her sisters are helping out and involved in her care. She started C/Lin July 2016. She is also on Azilect 1 mg once daily, started thisin July 2015. Sinemet was increased in 5/18 to1 pill4times a day. She has hada fall with injury to the ribs.For depression and anxiety she has been maintained on Lexapro 10 mg daily.She was advised to increase Comtan from 1 pill twice daily to 1 pill 3 times daily in January 2021. She has been on Sinemet 1 pill 4 times a day and 1 optional pill in the middle of the night.  We increased her Sinemet to 1 pill 5 times daily at our last visit in June 2021.  She was advised to continue with Comtan 3 times a day at her 7 AM dose, 1 PM dose and 7 PM dose. We started Comtan in June 2020.She was advised to use her walker at all times and we talked about the importance of fall prevention.  I have advised her to proceed with a head CT without contrast due to recent fall with head injury.  She is agreeable.  I renewed her prescriptions.  She is advised to look into getting a call alert or life alert button.  She is advised to follow-up in this office to see one of our nurse practitioners in about 4 months, sooner if needed.  I answered all her questions today and the patient and her Sister Tami Vega were in agreement. I spent 30 minutes in total face-to-face time and in reviewing records during pre-charting, more than 50% of which was spent in counseling and coordination of care, reviewing test results, reviewing medications and treatment regimen and/or in discussing or reviewing the diagnosis of PD, the prognosis and treatment options. Pertinent laboratory and imaging test results that were available during this visit with the patient were reviewed by me and considered in my medical decision making (see chart for details).

## 2020-06-10 NOTE — Patient Instructions (Addendum)
I believe you are taking Lexapro once daily 10 mg strength, this is your antidepressant medication through your primary care.  I have not prescribed that.  Please continue with your Sinemet 1 pill 5 times a day, Azilect 1 pill once daily, and entacapone 1 pill 3 times a day as before.  Please try to hydrate better as dehydration can be a risk for balance issues and confusion and increase your risk for falls.  Since you had a recent fall and bumped your left forehead, I would like to do a head CT without contrast, we can choose the location in [place].  We will call you with the results.  Please follow-up in this clinic to see one of our nurse practitioners in 4 months.  Your prescriptions should be up-to-date until your next appointment. Please use your walker at all times.  I do not believe you are stable enough to walk without a walker.

## 2020-06-11 ENCOUNTER — Telehealth: Payer: Self-pay | Admitting: Neurology

## 2020-06-11 NOTE — Telephone Encounter (Signed)
patient is scheduled at [facility] for 06/13/20 to arrive at 12:30 pm. patietn is aware of time & day  Orpah Clinton auth: [id number] (exp. 06/11/20 to 12/08/20)

## 2020-06-13 DIAGNOSIS — W19XXXA Unspecified fall, initial encounter: Secondary | ICD-10-CM | POA: Diagnosis not present

## 2020-06-13 DIAGNOSIS — S0990XA Unspecified injury of head, initial encounter: Secondary | ICD-10-CM | POA: Diagnosis not present

## 2020-06-19 ENCOUNTER — Telehealth: Payer: Self-pay | Admitting: Neurology

## 2020-06-19 NOTE — Telephone Encounter (Signed)
I received patient's head CT report from [facility] in [place], date of service 06/13/2020, head CT without contrast: Impression: Mildly motion degraded exam.  No evidence of acute intracranial abnormality.  Left forehead soft tissue swelling.  Mild cerebral atrophy.  1.3 x 0.9 cm nonspecific polypoid soft tissue fullness expanding the right sphenoethmoidal recess, stable as compared to the head CT of 11/02/2019.  Consider ENT consultation as clinically warranted.  Please call patient and advise her that her recent head CT did not show any acute injury.  There was a soft tissue swelling which they had seen on a prior CT from 11/02/2019.  The radiologist recommended evaluation through an ear, nose and throat specialist if need be.  She can discuss a referral to ENT with her primary care physician based on recent and prior head CT result.

## 2020-06-19 NOTE — Telephone Encounter (Signed)
I called the pt and advised of results. Pt verbalized understanding and had no questions/concerns.  Report sent to PCP.

## 2020-06-27 DIAGNOSIS — E785 Hyperlipidemia, unspecified: Secondary | ICD-10-CM | POA: Diagnosis not present

## 2020-06-27 DIAGNOSIS — R69 Illness, unspecified: Secondary | ICD-10-CM | POA: Diagnosis not present

## 2020-07-23 DIAGNOSIS — G20 Parkinson's disease: Secondary | ICD-10-CM | POA: Diagnosis not present

## 2020-07-23 DIAGNOSIS — R2681 Unsteadiness on feet: Secondary | ICD-10-CM | POA: Diagnosis not present

## 2020-07-23 DIAGNOSIS — E039 Hypothyroidism, unspecified: Secondary | ICD-10-CM | POA: Diagnosis not present

## 2020-07-23 DIAGNOSIS — R296 Repeated falls: Secondary | ICD-10-CM | POA: Diagnosis not present

## 2020-08-13 DIAGNOSIS — R21 Rash and other nonspecific skin eruption: Secondary | ICD-10-CM | POA: Diagnosis not present

## 2020-08-13 DIAGNOSIS — L02621 Furuncle of right foot: Secondary | ICD-10-CM | POA: Diagnosis not present

## 2020-10-03 ENCOUNTER — Other Ambulatory Visit: Payer: Self-pay | Admitting: Neurology

## 2020-10-14 ENCOUNTER — Ambulatory Visit (INDEPENDENT_AMBULATORY_CARE_PROVIDER_SITE_OTHER): Payer: Medicare HMO | Admitting: Family Medicine

## 2020-10-14 ENCOUNTER — Encounter: Payer: Self-pay | Admitting: Family Medicine

## 2020-10-14 VITALS — BP 117/66 | HR 79 | Ht 67.0 in | Wt 161.5 lb

## 2020-10-14 DIAGNOSIS — G20 Parkinson's disease: Secondary | ICD-10-CM

## 2020-10-14 DIAGNOSIS — G249 Dystonia, unspecified: Secondary | ICD-10-CM | POA: Diagnosis not present

## 2020-10-14 DIAGNOSIS — Z9181 History of falling: Secondary | ICD-10-CM | POA: Diagnosis not present

## 2020-10-14 DIAGNOSIS — R269 Unspecified abnormalities of gait and mobility: Secondary | ICD-10-CM | POA: Diagnosis not present

## 2020-10-14 MED ORDER — ENTACAPONE 200 MG PO TABS: 200.0000 mg | ORAL_TABLET | Freq: Three times a day (TID) | ORAL | 3 refills | Status: DC

## 2020-10-14 MED ORDER — CARBIDOPA-LEVODOPA 25-100 MG PO TABS: 1.0000 | ORAL_TABLET | Freq: Every day | ORAL | 3 refills | Status: DC

## 2020-10-14 MED ORDER — RASAGILINE MESYLATE 1 MG PO TABS: 1.0000 mg | ORAL_TABLET | Freq: Every day | ORAL | 3 refills | Status: DC

## 2020-10-14 NOTE — Progress Notes (Addendum)
Chief Complaint  Patient presents with   Follow-up    RM 1, with sister Vaughan Basta. Here for parkinson's disease fu. Pt reports no falls, uses walker. Pt states some days are better then worse, overall doing well.      HISTORY OF PRESENT ILLNESS: 10/14/20 ALL:  Tami Vega is a 80 y.o. female here today for follow up for PD. She was last seen by Dr Rexene Alberts 05/2020 and continued on Sinemet 1 tablet 5 times daily, Azilect 1 tablet daily and entacapone 1 tablet TID. She was participating in home PT and advised to increase water intake and use walker. Since, she reports doing fairly well. No recent falls. She is using her walker. Balance seems to be fairly stable. Tremor is very mild. She is tolerating medications well and without adverse effects. She feels that she is doing fairly well. She has good days and bad days. She is eating normally. Some foods do not taste as good over the past few months. She drinks a protein shake every day. She tries to drink at least 3 bottles of water. She reports having shoulder pain last night that was unusual but is much better, today. She denies trouble breathing or chest pain. She has had some congestion and a tickle cough, no fever or other respiratory symptoms.    HISTORY (copied from Dr Guadelupe Sabin previous note)  Tami Vega is a very pleasant 80 year old right-handed woman with an underlying medical history of hyperlipidemia, history of pituitary microadenoma, precancerous breast lesions status post double mastectomy in the 1980s, who presents for follow-up consultation of her left-sided predominant Parkinson's disease. The patient is accompanied by her sister, Vaughan Basta, today. I last saw her on 02/12/2020, at which time we talked about the importance of fall prevention.  She had fallen in August 2021 and broke her proximal right humerus.  She was treated conservatively.  She was in rehab and subsequently had home health therapy.  She has ongoing stress.  She was advised  to continue her Parkinson's medications.   Today, 06/10/2020: She reports having had some issues with her balance.  She has fallen about 3 times, most recently, she fell on the weekend on the way to her bed, she hit the wood floor with her forehead.  She did not lose consciousness and did not have any headache subsequently but does have some discomfort.  She had a small swelling in the left forehead and a slight remnant of it.  She reports that she has not been drinking water as well as she should.  She was told by her primary care nurse practitioner that she was dehydrated.  She had some blood work through her primary care recently, she is supposed to see Jaclynn Major, NP again in April.  She has ongoing stress, worries about her husband, sometimes she gets frustrated.  She indicates that she continues to take her antidepressant medication.  She is currently in home health PT.  Her youngest son, lives about 5 minutes away, her older son lives about 10 minutes away.  Tami Vega lives about 2 to 3 minutes away and [place] checks in on her typically on Tuesdays and Thursdays.   The patient's allergies, current medications, family history, past medical history, past social history, past surgical history and problem list were reviewed and updated as appropriate.    Previously (copied from previous notes for reference):    I saw her on 10/10/2019, at which time she reported that her Sinemet was not lasting as  long.  She was taking 1 pill 4 times a day.  She also had more stress what with her husband's declining health and memory loss.   I saw her on 04/12/2019, at which time she reported tolerating Comtan.  We mutually agreed to increase it to 1 pill 3 times daily.  She was advised to continue with Sinemet and Azilect and we talked about potentially utilizing inbrija potentially down the road.   I saw her on 12/07/2018, at which time she reported taking Comtan only once daily at 11 AM.  She felt that she was too  sedated when she took it twice daily but she was willing to try it again.  I suggest that she added for the 3 PM dose.  We kept her Sinemet the same as well as the Azilect.    I saw her on 09/05/2018, at which time she reported feeling more slower, she had had a few times of feeling lightheaded upon standing up too quickly.  She had a fall in the kitchen.  She was taking Sinemet 4 times a day.  She was more fatigued by the end of the day.  Constipation was under control.  She had had some stable hallucinations, nothing bothersome.  I suggested a cautious addition of Comtan for 2 of her 4 doses of Sinemet.  She called back in July 2020 indicating that she felt more unsteady since starting the Comtan and she was advised to reduce the Comtan to only once daily with 1 of her Sinemet doses.   I saw her on 03/02/2018, at which time she reported that she was not able to tolerate the Sinemet long-acting.  She had problems with insomnia, she was no longer on it.  We had started it in or around June 2019.  She was generally taking Sinemet 4 times a day.  She was having more difficulty in the mornings in terms of mobility.  She was advised to take a middle of the night dose of Sinemet for total of 5 pills/day, the middle of the night dose anywhere between 11 PM and 4 AM.  We talked about the importance of monitoring her driving.  She had limited her driving.   I saw her on 08/30/2017, at which time she reported having had one fall in the past 6 months. She fell in the kitchen. She unfortunately cracked a couple of ribs, had an x-ray through PCP. She noticed that she was more stiffer in the mornings and had difficulty moving at times, shuffling more. We talked about the importance of fall prevention and the importance of being proactive about constipation. She was advised to hydrate well with water and continue with Sinemet 1 pill 4 times a day at 7, 11, 3 and 7. I asked her to add Sinemet CR 50-200 milligrams strength at  bedtime. She was advised to continue with Azilect once daily as well.   She called a few days after that in the interim reporting that she felt more sleepy after starting the Sinemet CR. I suggested we could reduce it to 25-100 milligrams strength long-acting but she declined.   I saw her on 02/22/17, at which time she reported doing okay, sometimes she felt lightheaded. She had had a few falls, with a tendency to fall backwards when she stood up too quickly. For her occasional constipation she was using MiraLAX as needed about twice a week. I advised her to continue her current Parkinson's disease medication regimen including Sinemet 1 pill  4 times a day and Azilect 1 pill once daily, talked about fall risk and gait safety.   I saw her on 08/18/2016, at which time she reported increase in tremor on the left side. She had some difficulty with sleep at times. She had not tried any over-the-counter medication. She had a fall about 3 months prior in the yard. Thankfully she did not sustain any major injuries. She did bruise her right thigh. I suggested she try to increase her Sinemet to 1 pill 4 times a day and keep her Azilect at 1 mg once daily. She was advised to try melatonin for sleep.   I saw her on 02/19/2016, at which time she reported overall doing quite well, she was active, sewing and making crafts. She had no recent falls but had noticed lightheadedness particularly upon standing up quickly. She had a near fall experience recently at a restaurant. She was not always hydrating well. Mood and memory were stable. Motor-wise, she felt stable as well. I suggested we continue with her medication regimen, including Azilect and Sinemet.Marland Kitchen She was furthermore advised to stay better hydrated and change positions slowly, also start using compression stockings.   I saw her on 08/12/2015, at which time she reported feeling fairly stable. She was going to the gym about 4 times a week, had lost weight, mostly  because of change in taste/lack of taste. Also, she cut out sodas and has been watching what she eats, trying to drink more water. Overall, motor-wise she felt stable, had mild short-term memory issues but no mood or sinister memory issues, no recent falls. Sometimes her left hand tremor was worse. She has 3 grown children and 3 grandchildren. She was on low-dose Lexapro. I suggested she continue with Azilect once daily and Sinemet 1 pill 3 times a day.   I saw her on 02/11/2015, at which time she reported doing better with the recent addition of Sinemet. She felt she had more energy and mobility was better as well. She needed refills on paper prescriptions because of changing her pharmacies. Her daughter reported that she had stopped taking her antidepressant for a couple of months but her family had noticed more problems with her depression and anxiety and she restarted her medication. She was trying to stay active. She is trying to drink enough water. She did have additional stressors lately because her son was involved in a car accident and was still in the hospital at the time. Overall, however, she had done fairly well. We mutually agreed to continue her on Azilect 1 mg once daily and Sinemet 1 pill 3 times a day.   I saw her on 10/16/2014, at which time she reported that her tremor was a little worse. She also felt that her walking was worse. She had a harder time picking up her feet, particularly on the left side. Thankfully she had not fallen. She was sleeping a little better since switching her antidepressant to nighttime. I asked her to continue with Azilect once daily but also to add a small dose of Sinemet starting with half a pill twice daily with gradual titration to 1 pill 3 times a day.   I saw her on 04/17/2014, at which time she reported doing a little better with regards to her tremor. She noticed a flareup of her tremor with nervousness. She was still able to use her sewing machine. She  had no side effects from her medications. She did find out that she had 2  female cousins on her father's side with Parkinson's disease. I asked her to continue Azilect 1 mg once daily.   I first met her on 10/20/2013 at the request of her primary care physician, at which time she gave a 6-12 month history of left upper extremity tremor. She also reported problems with fine motor skills on the left. Her history and physical exam are in keeping with mild parkinsonism, probably left eye predominant Parkinson's disease. I suggested further workup with a brain MRI and starting her on Azilect. She did not have a brain scan done.   She has noted L sided issues with fine motor skills, resting tremor on the LUE, she feels easily fatigued and it is difficult for her to pick up her L leg sometimes. Thankfully, she has not fallen, but her balance is off at times. She goes to [employer] 4 times a week, but has less stamina. She is a life long non-smoker, does not drink alcohol and has no history of head injury, exposure to pesticides, but did work in a Emergency planning/management officer for 33 years and she [place]. She has no FHx of PD or tremors.    REVIEW OF SYSTEMS: Out of a complete 14 system review of symptoms, the patient complains only of the following symptoms, fatigue, left hand tremor, imbalance, shoulder pain and all other reviewed systems are negative.   ALLERGIES: No Known Allergies   HOME MEDICATIONS: Outpatient Medications Prior to Visit  Medication Sig Dispense Refill   b complex vitamins tablet Take 1 tablet by mouth daily.     Calcium Carbonate-Vit D-Min (CALCIUM 1200 PO) Take by mouth daily. Take one tablet a day     cholecalciferol (VITAMIN D) 25 MCG tablet Take 1 tablet (1,000 Units total) by mouth daily. 30 tablet    D-Mannose 500 MG CAPS Take by mouth. 3 per day     escitalopram (LEXAPRO) 10 MG tablet Take 10 mg by mouth daily.  4   fish oil-omega-3 fatty acids 1000 MG capsule Take 2 g by mouth daily.      levothyroxine (SYNTHROID) 88 MCG tablet Take 88 mcg by mouth daily before breakfast.     Multiple Vitamin (MULTIVITAMIN WITH MINERALS) TABS tablet Take 1 tablet by mouth daily.     OMEPRAZOLE PO Take by mouth.     polyethylene glycol (MIRALAX / GLYCOLAX) 17 g packet Take 17 g by mouth daily as needed.     senna-docusate (SENOKOT-S) 8.6-50 MG tablet Take 2 tablets by mouth at bedtime as needed for mild constipation or moderate constipation.     carbidopa-levodopa (SINEMET IR) 25-100 MG tablet Take 1 tablet by mouth 5 (five) times daily. Take at  7 AM, 10 AM, 1 PM, 4 PM and  7 PM 450 tablet 3   entacapone (COMTAN) 200 MG tablet TAKE 1 TABLET (200 MG TOTAL) BY MOUTH 3 (THREE) TIMES DAILY. TAKE WITH SINEMET DOSE AT 7 AM, 1 PM AND 7 PM. 270 tablet 0   melatonin 5 MG TABS Take 5 mg by mouth at bedtime as needed (sleep).     rasagiline (AZILECT) 1 MG TABS tablet TAKE 1 TABLET BY MOUTH EVERY DAY 90 tablet 3   No facility-administered medications prior to visit.     PAST MEDICAL HISTORY: Past Medical History:  Diagnosis Date   Abnormal involuntary movements(781.0)    Disorder of bone and cartilage, unspecified    Elevated blood pressure reading without diagnosis of hypertension    Major  depressive disorder, single episode, unspecified    Other musculoskeletal symptoms referable to limbs(729.89)    Pain in joint, lower leg    Pure hypercholesterolemia      PAST SURGICAL HISTORY: Past Surgical History:  Procedure Laterality Date   BREAST SURGERY  1980's   benign cyst   COLONOSCOPY  1995, 1998   VAGINAL HYSTERECTOMY  1975     FAMILY HISTORY: Family History  Problem Relation Age of Onset   CAD Mother    Breast cancer Mother    CAD Father    CAD Brother    Breast cancer Maternal Aunt    Colon cancer Maternal Aunt    Hyperlipidemia Sister    Hypertension Sister      SOCIAL HISTORY: Social History   Socioeconomic History   Marital status: Married    Spouse name: Not on  file   Number of children: Not on file   Years of education: Not on file   Highest education level: Not on file  Occupational History   Not on file  Tobacco Use   Smoking status: Never   Smokeless tobacco: Never  Substance and Sexual Activity   Alcohol use: No    Alcohol/week: 0.0 standard drinks   Drug use: No   Sexual activity: Not on file  Other Topics Concern   Not on file  Social History Narrative   Not on file   Social Determinants of Health   Financial Resource Strain: Not on file  Food Insecurity: Not on file  Transportation Needs: Not on file  Physical Activity: Not on file  Stress: Not on file  Social Connections: Not on file  Intimate Partner Violence: Not on file     PHYSICAL EXAM  Vitals:   10/14/20 0929  BP: 117/66  Pulse: 79  Weight: 161 lb 8 oz (73.3 kg)  Height: _0  (1.702 m)   Body mass index is 25.29 kg/m.   Generalized: Well developed, in no acute distress  Cardiology: normal rate and rhythm, no murmur auscultated  Respiratory: clear to auscultation bilaterally    Neurological examination  Mentation: Alert oriented to time, place, history taking. Follows all commands speech and language fluent but hypophonic Cranial nerve II-XII: Pupils were equal round reactive to light. Extraocular movements show saccadic breakdown. No nystagmus.  visual field were full on confrontational test. Facial sensation and strength were normal. Uvula tongue midline. Head turning and shoulder shrug  were normal and symmetric. Motor: The motor testing reveals 5 over 5 strength of all 4 extremities. Tone is more rigid with left upper extremity cogwheeling. Mild bradykinesia noted. Left upper ext resting tremor Sensory: Sensory testing is intact to soft touch on all 4 extremities. No evidence of extinction is noted.  Coordination: Cerebellar testing reveals good finger-nose-finger and heel-to-shin bilaterally.  Gait and station: Gait is stable with walker. Tandem not  attempted  Reflexes: Deep tendon reflexes are symmetric and normal bilaterally.    DIAGNOSTIC DATA (LABS, IMAGING, TESTING) - I reviewed patient records, labs, notes, testing and imaging myself where available.  Lab Results  Component Value Date   WBC 7.7 11/02/2019   HGB 10.2 (L) 11/02/2019   HCT 30.2 (L) 11/02/2019   MCV 91.2 11/02/2019   PLT 156 11/02/2019      Component Value Date/Time   NA 135 11/07/2019 0508   K 4.0 11/07/2019 0508   CL 102 11/07/2019 0508   CO2 24 11/07/2019 0508   GLUCOSE 103 (H) 11/07/2019 0508   BUN  25 (H) 11/07/2019 0508   CREATININE 0.86 11/07/2019 0508   CALCIUM 8.5 (L) 11/07/2019 0508   PROT 6.7 11/01/2019 2354   ALBUMIN 4.2 11/01/2019 2354   AST 11 (L) 11/01/2019 2354   ALT 9 11/01/2019 2354   ALKPHOS 47 11/01/2019 2354   BILITOT 1.3 (H) 11/01/2019 2354   GFRNONAA >60 11/07/2019 0508   GFRAA >60 11/07/2019 0508   No results found for: CHOL, HDL, LDLCALC, LDLDIRECT, TRIG, CHOLHDL No results found for: HGBA1C No results found for: VITAMINB12 Lab Results  Component Value Date   TSH 1.608 11/02/2019    No flowsheet data found.   No flowsheet data found.   ASSESSMENT AND PLAN  80 y.o. year old female  has a past medical history of Abnormal involuntary movements(781.0), Disorder of bone and cartilage, unspecified, Elevated blood pressure reading without diagnosis of hypertension, Major depressive disorder, single episode, unspecified, Other musculoskeletal symptoms referable to limbs(729.89), Pain in joint, lower leg, and Pure hypercholesterolemia. here with    Parkinson's disease ([place]) - Plan: carbidopa-levodopa (SINEMET IR) 25-100 MG tablet, rasagiline (AZILECT) 1 MG TABS tablet  Dyskinesia due to Parkinson's disease ([place])  History of recent fall  Gait disorder  Tami Vega is doing well from a neurological perspective. Parkinson symptoms are stable on current treatment. We will continue generic Sinemet 1 tablet five times daily,  generic Azilect 67m daily and generic Comtan 2047mTID. She was encouraged to continue healthy lifestyle habits. Adequate hydration discussed. Fall precautions reviewed. She was advised to monitor shoulder pain closely and notify primary care provider for any worsening or unusual symptoms as discussed. She and her sister, LiVaughan Bastaverbalize understanding. I will alternate her care with Dr AtRexene Albertsnd have her return to see usKorean 6 months. She verbalizes understanding and agreement with this plan.   No orders of the defined types were placed in this encounter.    Meds ordered this encounter  Medications   carbidopa-levodopa (SINEMET IR) 25-100 MG tablet    Sig: Take 1 tablet by mouth 5 (five) times daily. Take at  7 AM, 10 AM, 1 PM, 4 PM and  7 PM    Dispense:  450 tablet    Refill:  3    Order Specific Question:   Supervising Provider    Answer:   AHMelvenia Beam1[[id number]] entacapone (COMTAN) 200 MG tablet    Sig: Take 1 tablet (200 mg total) by mouth 3 (three) times daily. Take with Sinemet dose at 7 AM, 1 PM and 7 PM.    Dispense:  270 tablet    Refill:  3    Order Specific Question:   Supervising Provider    Answer:   AHMelvenia Beam1[[id number]] rasagiline (AZILECT) 1 MG TABS tablet    Sig: Take 1 tablet (1 mg total) by mouth daily.    Dispense:  90 tablet    Refill:  3    Order Specific Question:   Supervising Provider    Answer:   AHMelvenia Beam1[[id number]]     AmDebbora PrestoMSN, FNP-C 10/14/2020, 10:58 AM  [facility] [address]SuLatham[address]Peckham[place] [id number] reviewed the above note and documentation by the Nurse Practitioner and agree with the history, exam, assessment and plan as outlined above. I was available for consultation. SaStar AgeMD, PhD [facility] ([facility]

## 2020-10-14 NOTE — Patient Instructions (Addendum)
Below is our plan:  We will continue Sinemet 1 tablet five times daily, Azilect daily and entacapone three times daily. Please use your walker at all times. Kepe a close eye on your shoulder and back pain. Seek medical attention immediately for any red flag warnings as discussed.   Please make sure you are staying well hydrated. I recommend 50-60 ounces daily. Well balanced diet and regular exercise encouraged. Consistent sleep schedule with 6-8 hours recommended.   Please continue follow up with care team as directed.   Follow up with Dr Frances Furbish in 6 months   You may receive a survey regarding today's visit. I encourage you to leave honest feed back as I do use this information to improve patient care. Thank you for seeing me today!

## 2020-10-15 DIAGNOSIS — U071 COVID-19: Secondary | ICD-10-CM | POA: Diagnosis not present

## 2020-10-21 DIAGNOSIS — U071 COVID-19: Secondary | ICD-10-CM | POA: Diagnosis not present

## 2020-11-19 DIAGNOSIS — E039 Hypothyroidism, unspecified: Secondary | ICD-10-CM | POA: Diagnosis not present

## 2020-11-19 DIAGNOSIS — M8580 Other specified disorders of bone density and structure, unspecified site: Secondary | ICD-10-CM | POA: Diagnosis not present

## 2020-11-19 DIAGNOSIS — Z0000 Encounter for general adult medical examination without abnormal findings: Secondary | ICD-10-CM | POA: Diagnosis not present

## 2020-11-19 DIAGNOSIS — Z6823 Body mass index (BMI) 23.0-23.9, adult: Secondary | ICD-10-CM | POA: Diagnosis not present

## 2020-11-19 DIAGNOSIS — Z1331 Encounter for screening for depression: Secondary | ICD-10-CM | POA: Diagnosis not present

## 2020-11-19 DIAGNOSIS — R200 Anesthesia of skin: Secondary | ICD-10-CM | POA: Diagnosis not present

## 2020-11-19 DIAGNOSIS — H6123 Impacted cerumen, bilateral: Secondary | ICD-10-CM | POA: Diagnosis not present

## 2020-11-19 DIAGNOSIS — E782 Mixed hyperlipidemia: Secondary | ICD-10-CM | POA: Diagnosis not present

## 2020-12-06 DIAGNOSIS — M810 Age-related osteoporosis without current pathological fracture: Secondary | ICD-10-CM | POA: Diagnosis not present

## 2020-12-06 DIAGNOSIS — M545 Low back pain, unspecified: Secondary | ICD-10-CM | POA: Diagnosis not present

## 2020-12-06 DIAGNOSIS — R69 Illness, unspecified: Secondary | ICD-10-CM | POA: Diagnosis not present

## 2020-12-06 DIAGNOSIS — F3341 Major depressive disorder, recurrent, in partial remission: Secondary | ICD-10-CM | POA: Diagnosis not present

## 2020-12-06 DIAGNOSIS — G20 Parkinson's disease: Secondary | ICD-10-CM | POA: Diagnosis not present

## 2020-12-06 DIAGNOSIS — G8929 Other chronic pain: Secondary | ICD-10-CM | POA: Diagnosis not present

## 2020-12-06 DIAGNOSIS — G3184 Mild cognitive impairment, so stated: Secondary | ICD-10-CM | POA: Diagnosis not present

## 2020-12-06 DIAGNOSIS — K219 Gastro-esophageal reflux disease without esophagitis: Secondary | ICD-10-CM | POA: Diagnosis not present

## 2020-12-06 DIAGNOSIS — E039 Hypothyroidism, unspecified: Secondary | ICD-10-CM | POA: Diagnosis not present

## 2020-12-06 DIAGNOSIS — F411 Generalized anxiety disorder: Secondary | ICD-10-CM | POA: Diagnosis not present

## 2020-12-06 DIAGNOSIS — K5900 Constipation, unspecified: Secondary | ICD-10-CM | POA: Diagnosis not present

## 2020-12-06 DIAGNOSIS — M1990 Unspecified osteoarthritis, unspecified site: Secondary | ICD-10-CM | POA: Diagnosis not present

## 2020-12-06 DIAGNOSIS — M8580 Other specified disorders of bone density and structure, unspecified site: Secondary | ICD-10-CM | POA: Diagnosis not present

## 2021-01-09 DIAGNOSIS — H16223 Keratoconjunctivitis sicca, not specified as Sjogren's, bilateral: Secondary | ICD-10-CM | POA: Diagnosis not present

## 2021-01-09 DIAGNOSIS — H4749 Disorders of optic chiasm in (due to) other disorders: Secondary | ICD-10-CM | POA: Diagnosis not present

## 2021-01-09 DIAGNOSIS — H52223 Regular astigmatism, bilateral: Secondary | ICD-10-CM | POA: Diagnosis not present

## 2021-01-09 DIAGNOSIS — H3550 Unspecified hereditary retinal dystrophy: Secondary | ICD-10-CM | POA: Diagnosis not present

## 2021-01-09 DIAGNOSIS — Z961 Presence of intraocular lens: Secondary | ICD-10-CM | POA: Diagnosis not present

## 2021-01-16 DIAGNOSIS — H524 Presbyopia: Secondary | ICD-10-CM | POA: Diagnosis not present

## 2021-01-23 DIAGNOSIS — T148XXA Other injury of unspecified body region, initial encounter: Secondary | ICD-10-CM | POA: Diagnosis not present

## 2021-01-23 DIAGNOSIS — W19XXXA Unspecified fall, initial encounter: Secondary | ICD-10-CM | POA: Diagnosis not present

## 2021-01-23 DIAGNOSIS — Z23 Encounter for immunization: Secondary | ICD-10-CM | POA: Diagnosis not present

## 2021-02-04 ENCOUNTER — Other Ambulatory Visit: Payer: Self-pay | Admitting: Neurology

## 2021-02-04 DIAGNOSIS — G20 Parkinson's disease: Secondary | ICD-10-CM

## 2021-03-04 DIAGNOSIS — W19XXXA Unspecified fall, initial encounter: Secondary | ICD-10-CM | POA: Diagnosis not present

## 2021-03-04 DIAGNOSIS — R0781 Pleurodynia: Secondary | ICD-10-CM | POA: Diagnosis not present

## 2021-03-04 DIAGNOSIS — J3489 Other specified disorders of nose and nasal sinuses: Secondary | ICD-10-CM | POA: Diagnosis not present

## 2021-04-15 ENCOUNTER — Ambulatory Visit: Payer: Medicare HMO | Admitting: Neurology

## 2021-04-15 ENCOUNTER — Encounter: Payer: Self-pay | Admitting: Neurology

## 2021-04-15 VITALS — BP 125/67 | HR 79 | Ht 67.0 in | Wt 159.2 lb

## 2021-04-15 DIAGNOSIS — K5900 Constipation, unspecified: Secondary | ICD-10-CM | POA: Diagnosis not present

## 2021-04-15 DIAGNOSIS — F439 Reaction to severe stress, unspecified: Secondary | ICD-10-CM

## 2021-04-15 DIAGNOSIS — G20 Parkinson's disease: Secondary | ICD-10-CM | POA: Diagnosis not present

## 2021-04-15 DIAGNOSIS — R69 Illness, unspecified: Secondary | ICD-10-CM | POA: Diagnosis not present

## 2021-04-15 DIAGNOSIS — G249 Dystonia, unspecified: Secondary | ICD-10-CM

## 2021-04-15 NOTE — Patient Instructions (Addendum)
It was nice to see you both again today. As discussed, I would like for you to continue with your Sinemet 5 times a day, entacapone 3 times a day, and Azilect once daily. Please talk to your primary care about anxiety and stress management.  You are on Lexapro 10 mg daily.  There is some room for increase if you feel that your anxiety and stress are more overwhelming.   Please try to hydrate well, 6 to 8 cups of water per day and certainly recommended, 8 ounce size each.  Please continue to be conducted about constipation issues.  Follow up to see Debbora Presto, NP in about 4 months.

## 2021-04-15 NOTE — Progress Notes (Signed)
Subjective:    Patient ID: Tami Vega is a 81 y.o. female.  HPI    Interim history:   Tami Vega is a very pleasant 81 year old right-handed woman with an underlying medical history of hyperlipidemia, history of pituitary microadenoma, precancerous breast lesions status post double mastectomy in the 1980s, who presents for follow-up consultation of her left-sided predominant Parkinson's disease. The patient is accompanied by her sister, Vaughan Basta, again today. I last saw her on 06/10/2020, at which time she reported a recent fall.  She did hit her head.  She was not hydrating as well as she should and had been told by her primary care that she was dehydrated.  She reported ongoing stress and worry about her husband.  She was in home health physical therapy.  She was advised to continue with Sinemet 1 pill 5 times a day, Azilect once daily and entacapone 1 pill 3 times a day.  She was advised to hydrate better.  Since she had sustained a fall with head injury, I advised her to get a head CT done.  She had a normal head CT through [facility] in [place] on 06/13/2020 and I reviewed the results: Impression: Mildly motion degraded exam.  No evidence of acute intracranial abnormality.  Left forehead soft tissue swelling.  Mild cerebral atrophy.  1.3 x 0.9 cm nonspecific polypoid soft tissue fullness expanding the right sphenoethmoidal recess, stable as compared to the head CT of 11/02/2019.  Consider ENT consultation as clinically warranted.  She saw Debbora Presto, NP in the interim on 10/14/2020, at which time she reported no recent falls.  She was advised to continue with her medication regimen.  Today, 04/15/2021: She reports feeling fairly stable but fatigue factor is at times quite high.  She feels tired during the day and sometimes sleepy.  She takes a nap during the day.  She does not always hydrate well, constipation is generally well controlled so long as she takes her constipation medications on a  regular basis.  She takes MiraLAX and senna.  She takes Sinemet 5 times a day.  Her son has moved in with them essentially to stay to help them both.  Her husband has had dementia and they both needed more help.  She has not fallen recently. She uses a 2 wheeled walker.  Stress and anxiety have been an ongoing issue.    The patient's allergies, current medications, family history, past medical history, past social history, past surgical history and problem list were reviewed and updated as appropriate.    Previously (copied from previous notes for reference):    I saw her on 02/12/2020, at which time we talked about the importance of fall prevention.  She had fallen in August 2021 and broke her proximal right humerus.  She was treated conservatively.  She was in rehab and subsequently had home health therapy.  She has ongoing stress.  She was advised to continue her Parkinson's medications.     I saw her on 10/10/2019, at which time she reported that her Sinemet was not lasting as long.  She was taking 1 pill 4 times a day.  She also had more stress what with her husband's declining health and memory loss.      I saw her on 04/12/2019, at which time she reported tolerating Comtan.  We mutually agreed to increase it to 1 pill 3 times daily.  She was advised to continue with Sinemet and Azilect and we talked about potentially utilizing inbrija  potentially down the road.         I saw her on 12/07/2018, at which time she reported taking Comtan only once daily at 11 AM.  She felt that she was too sedated when she took it twice daily but she was willing to try it again.  I suggest that she added for the 3 PM dose.  We kept her Sinemet the same as well as the Azilect.          I saw her on 09/05/2018, at which time she reported feeling more slower, she had had a few times of feeling lightheaded upon standing up too quickly.  She had a fall in the kitchen.  She was taking Sinemet 4 times a day.  She was  more fatigued by the end of the day.  Constipation was under control.  She had had some stable hallucinations, nothing bothersome.  I suggested a cautious addition of Comtan for 2 of her 4 doses of Sinemet.  She called back in July 2020 indicating that she felt more unsteady since starting the Comtan and she was advised to reduce the Comtan to only once daily with 1 of her Sinemet doses.   I saw her on 03/02/2018, at which time she reported that she was not able to tolerate the Sinemet long-acting.  She had problems with insomnia, she was no longer on it.  We had started it in or around June 2019.  She was generally taking Sinemet 4 times a day.  She was having more difficulty in the mornings in terms of mobility.  She was advised to take a middle of the night dose of Sinemet for total of 5 pills/day, the middle of the night dose anywhere between 11 PM and 4 AM.  We talked about the importance of monitoring her driving.  She had limited her driving.     I saw her on 08/30/2017, at which time she reported having had one fall in the past 6 months. She fell in the kitchen. She unfortunately cracked a couple of ribs, had an x-ray through PCP. She noticed that she was more stiffer in the mornings and had difficulty moving at times, shuffling more. We talked about the importance of fall prevention and the importance of being proactive about constipation. She was advised to hydrate well with water and continue with Sinemet 1 pill 4 times a day at 7, 11, 3 and 7. I asked her to add Sinemet CR 50-200 milligrams strength at bedtime. She was advised to continue with Azilect once daily as well.   She called a few days after that in the interim reporting that she felt more sleepy after starting the Sinemet CR. I suggested we could reduce it to 25-100 milligrams strength long-acting but she declined.   I saw her on 02/22/17, at which time she reported doing okay, sometimes she felt lightheaded. She had had a few falls,  with a tendency to fall backwards when she stood up too quickly. For her occasional constipation she was using MiraLAX as needed about twice a week. I advised her to continue her current Parkinson's disease medication regimen including Sinemet 1 pill 4 times a day and Azilect 1 pill once daily, talked about fall risk and gait safety.   I saw her on 08/18/2016, at which time she reported increase in tremor on the left side. She had some difficulty with sleep at times. She had not tried any over-the-counter medication. She had a fall about  3 months prior in the yard. Thankfully she did not sustain any major injuries. She did bruise her right thigh. I suggested she try to increase her Sinemet to 1 pill 4 times a day and keep her Azilect at 1 mg once daily. She was advised to try melatonin for sleep.   I saw her on 02/19/2016, at which time she reported overall doing quite well, she was active, sewing and making crafts. She had no recent falls but had noticed lightheadedness particularly upon standing up quickly. She had a near fall experience recently at a restaurant. She was not always hydrating well. Mood and memory were stable. Motor-wise, she felt stable as well. I suggested we continue with her medication regimen, including Azilect and Sinemet.Marland Kitchen She was furthermore advised to stay better hydrated and change positions slowly, also start using compression stockings.   I saw her on 08/12/2015, at which time she reported feeling fairly stable. She was going to the gym about 4 times a week, had lost weight, mostly because of change in taste/lack of taste. Also, she cut out sodas and has been watching what she eats, trying to drink more water. Overall, motor-wise she felt stable, had mild short-term memory issues but no mood or sinister memory issues, no recent falls. Sometimes her left hand tremor was worse. She has 3 grown children and 3 grandchildren. She was on low-dose Lexapro. I suggested she continue with  Azilect once daily and Sinemet 1 pill 3 times a day.   I saw her on 02/11/2015, at which time she reported doing better with the recent addition of Sinemet. She felt she had more energy and mobility was better as well. She needed refills on paper prescriptions because of changing her pharmacies. Her daughter reported that she had stopped taking her antidepressant for a couple of months but her family had noticed more problems with her depression and anxiety and she restarted her medication. She was trying to stay active. She is trying to drink enough water. She did have additional stressors lately because her son was involved in a car accident and was still in the hospital at the time. Overall, however, she had done fairly well. We mutually agreed to continue her on Azilect 1 mg once daily and Sinemet 1 pill 3 times a day.   I saw her on 10/16/2014, at which time she reported that her tremor was a little worse. She also felt that her walking was worse. She had a harder time picking up her feet, particularly on the left side. Thankfully she had not fallen. She was sleeping a little better since switching her antidepressant to nighttime. I asked her to continue with Azilect once daily but also to add a small dose of Sinemet starting with half a pill twice daily with gradual titration to 1 pill 3 times a day.    I saw her on 04/17/2014, at which time she reported doing a little better with regards to her tremor. She noticed a flareup of her tremor with nervousness. She was still able to use her sewing machine. She had no side effects from her medications. She did find out that she had 2 female cousins on her father's side with Parkinson's disease. I asked her to continue Azilect 1 mg once daily.   I first met her on 10/20/2013 at the request of her primary care physician, at which time she gave a 6-12 month history of left upper extremity tremor. She also reported problems with fine motor  skills on the left. Her  history and physical exam are in keeping with mild parkinsonism, probably left eye predominant Parkinson's disease. I suggested further workup with a brain MRI and starting her on Azilect. She did not have a brain scan done.    She has noted L sided issues with fine motor skills, resting tremor on the LUE, she feels easily fatigued and it is difficult for her to pick up her L leg sometimes. Thankfully, she has not fallen, but her balance is off at times. She goes to [employer] 4 times a week, but has less stamina. She is a life long non-smoker, does not drink alcohol and has no history of head injury, exposure to pesticides, but did work in a Emergency planning/management officer for 33 years and she [place]. She has no FHx of PD or tremors.   Her Past Medical History Is Significant For: Past Medical History:  Diagnosis Date   Abnormal involuntary movements(781.0)    Disorder of bone and cartilage, unspecified    Elevated blood pressure reading without diagnosis of hypertension    Major depressive disorder, single episode, unspecified    Other musculoskeletal symptoms referable to limbs(729.89)    Pain in joint, lower leg    Pure hypercholesterolemia     Her Past Surgical History Is Significant For: Past Surgical History:  Procedure Laterality Date   BREAST SURGERY  1980's   benign cyst   COLONOSCOPY  1995, [place]    Her Family History Is Significant For: Family History  Problem Relation Age of Onset   CAD Mother    Breast cancer Mother    CAD Father    Parkinson's disease Father    Tremor Sister    Hyperlipidemia Sister    Hypertension Sister    CAD Brother    Breast cancer Maternal Aunt    Colon cancer Maternal Aunt     Her Social History Is Significant For: Social History   Socioeconomic History   Marital status: Married    Spouse name: Not on file   Number of children: Not on file   Years of education: Not on file   Highest education level: Not on file   Occupational History   Not on file  Tobacco Use   Smoking status: Never   Smokeless tobacco: Never  Substance and Sexual Activity   Alcohol use: No    Alcohol/week: 0.0 standard drinks   Drug use: No   Sexual activity: Not on file  Other Topics Concern   Not on file  Social History Narrative   Not on file   Social Determinants of Health   Financial Resource Strain: Not on file  Food Insecurity: Not on file  Transportation Needs: Not on file  Physical Activity: Not on file  Stress: Not on file  Social Connections: Not on file    Her Allergies Are:  No Known Allergies:   Her Current Medications Are:  Outpatient Encounter Medications as of 04/15/2021  Medication Sig   b complex vitamins tablet Take 1 tablet by mouth daily.   Calcium Carbonate-Vit D-Min (CALCIUM 1200 PO) Take by mouth daily. Take one tablet a day   carbidopa-levodopa (SINEMET IR) 25-100 MG tablet Take 1 tablet by mouth 5 (five) times daily. Take at  7 AM, 10 AM, 1 PM, 4 PM and  7 PM   cholecalciferol (VITAMIN D) 25 MCG tablet Take 1 tablet (1,000 Units total) by mouth daily.  D-Mannose 500 MG CAPS Take by mouth. 3 per day   entacapone (COMTAN) 200 MG tablet Take 1 tablet (200 mg total) by mouth 3 (three) times daily. Take with Sinemet dose at 7 AM, 1 PM and 7 PM.   escitalopram (LEXAPRO) 10 MG tablet Take 10 mg by mouth daily.   fish oil-omega-3 fatty acids 1000 MG capsule Take 2 g by mouth daily.   levothyroxine (SYNTHROID) 88 MCG tablet Take 88 mcg by mouth daily before breakfast.   Multiple Vitamin (MULTIVITAMIN WITH MINERALS) TABS tablet Take 1 tablet by mouth daily.   OMEPRAZOLE PO Take by mouth.   polyethylene glycol (MIRALAX / GLYCOLAX) 17 g packet Take 17 g by mouth daily as needed.   rasagiline (AZILECT) 1 MG TABS tablet Take 1 tablet (1 mg total) by mouth daily.   senna-docusate (SENOKOT-S) 8.6-50 MG tablet Take 2 tablets by mouth at bedtime as needed for mild constipation or moderate constipation.    No facility-administered encounter medications on file as of 04/15/2021.  :  Review of Systems:  Out of a complete 14 point review of systems, all are reviewed and negative with the exception of these symptoms as listed below:   Review of Systems  Neurological:        Pt is here for parkinson's follow up .Sister states she has fatigue more often now . Sister states the patient is going through a lot of emotional stress at home . That seems to affect her health a lot .   Objective:  Neurological Exam  Physical Exam Physical Examination:   Vitals:   04/15/21 0931  BP: 125/67  Pulse: 79    General Examination: The patient is a very pleasant 81 y.o. female in no acute distress. She appears well-developed and well-nourished and well groomed.   HEENT: Normocephalic, atraumatic, pupils are equal, round and reactive to light. Corrective eyeglasses in place, bilateral cataracts noted. Extraocular tracking shows saccadic breakdown without nystagmus noted.  There is mild decrease in eye blink rate. Hearing is impaired. Face is symmetric with moderate facial masking and normal facial sensation. There is no lip, neck or jaw tremor. Neck is moderately rigid with decreased ROM and more stooped neck. Oropharynx exam reveals mild mouth dryness. No significant airway crowding is noted. Mallampati is class II. Tongue protrudes centrally and palate elevates symmetrically. There is no drooling.    Chest: is clear to auscultation without wheezing, rhonchi or crackles noted.   Heart: sounds are regular and normal without murmurs, rubs or gallops noted.    Abdomen: is soft, non-tender and non-distended.   Extremities: There is no pitting edema in the distal lower extremities bilaterally.    Skin: is warm and dry with no trophic changes noted. Age-related changes are noted on the skin.   Musculoskeletal: exam reveals no obvious joint deformities.   Neurologically:  Mental status: The patient is  awake and alert, paying good attention,  at times slower to respond, speech is hypophonic, memory mildly impaired, sister also provide some details of her history.   Mild degree of bradyphrenia.  Slight dysarthria noted. Affect  normal and stable.  Cranial nerves are as described above under HEENT exam.   Motor exam: Normal bulk, and strength for age is noted. There are mild generalized dyskinesias today.   Tone is more rigid with presence of cogwheeling in the left upper extremity. There is overall moderate bradykinesia. There is no drift or rebound.  There is a mild to moderate resting tremor  in the left upper extremity and no other resting tremor. Romberg is not tested d/t safety concern. Fine motor skills exam: mild to moderately impaired on the right and moderate to significantly impaired on the left today.   Cerebellar testing shows no dysmetria or intention tremor on finger to nose testing. there is no truncal or gait ataxia.   Sensory exam is intact to light touch in the upper and lower extremities.    Gait, station and balance: She stands up from the seated position with  mild difficulty, requires no assistance.  She walks with her walker.  Balance is mildly impaired.    Assessment and Plan:    In summary, Karmon Andis is a very pleasant 81 year old female with an underlying medical history of hyperlipidemia, history of pituitary microadenoma, precancerous breast lesions (with s/p double mastectomy in the 1980s), who presents for follow-up consultation of her left-sided predominant Parkinson's disease with symptoms dating back to about 7 years ago. She has had mild progression over time, significantly more notable progression in the past 2 years.  She has had falls.  She had a fall in August 2021 and broke her arm.  She has also developed dyskinesias and intermittent mild visual hallucinations, as well as constipation. She has had increase in stress what with her husband's medical issues and  memory loss.  She does have a good psychosocial support system and her 2 sons and her sisters are helping out, very involved in her care.  She has an older brother and 2 younger sisters.  She started C/L in July 2016. She is also on Azilect 1 mg once daily, started this in July 2015. Sinemet was increased in 5/18 to 1 pill 4 times a day. She has had a fall with injury to the ribs. For depression and anxiety she has been maintained on Lexapro 10 mg daily, per PCP.  She was advised to increase Comtan from 1 pill twice daily to 1 pill 3 times daily in January 2021.  She has been on Sinemet 1 pill 4 times a day and 1 optional pill in the middle of the night.  We increased her Sinemet to 1 pill 5 times daily at our last visit in June 2021.  She was advised to continue with Comtan 3 times a day at her 7 AM dose, 1 PM dose and 7 PM dose. We started Comtan in June 2020. She was advised to use her walker at all times and we talked about the importance of fall prevention again today.  She had a CT head in 2022 after a fall. She is Sinemet 1 pill 5 times a day, she is advised to continue with entacapone 200 mg 3 times daily and Azilect or generic 1 mg once daily.  She is advised to talk to her primary care about anxiety and stress management, stay well-hydrated with water, use her walker at all times and be proactive about her constipation issues.  She has mild dyskinesias and increasing her Sinemet will put her at risk for hallucination exacerbation as well as dyskinesia exacerbation.  She is agreeable to maintaining the course.  She is advised to follow-up to see one of our nurse practitioners in 4 months, sooner if needed.  I answered all their questions today and the patient and her sister were in agreement.  I spent 30 minutes in total face-to-face time and in reviewing records during pre-charting, more than 50% of which was spent in counseling and coordination of care,  reviewing test results, reviewing medications and  treatment regimen and/or in discussing or reviewing the diagnosis of PD, the prognosis and treatment options. Pertinent laboratory and imaging test results that were available during this visit with the patient were reviewed by me and considered in my medical decision making (see chart for details).

## 2021-06-02 DIAGNOSIS — R32 Unspecified urinary incontinence: Secondary | ICD-10-CM | POA: Diagnosis not present

## 2021-06-02 DIAGNOSIS — I739 Peripheral vascular disease, unspecified: Secondary | ICD-10-CM | POA: Diagnosis not present

## 2021-06-02 DIAGNOSIS — R030 Elevated blood-pressure reading, without diagnosis of hypertension: Secondary | ICD-10-CM | POA: Diagnosis not present

## 2021-06-02 DIAGNOSIS — R69 Illness, unspecified: Secondary | ICD-10-CM | POA: Diagnosis not present

## 2021-06-02 DIAGNOSIS — Z8249 Family history of ischemic heart disease and other diseases of the circulatory system: Secondary | ICD-10-CM | POA: Diagnosis not present

## 2021-06-02 DIAGNOSIS — K5900 Constipation, unspecified: Secondary | ICD-10-CM | POA: Diagnosis not present

## 2021-06-02 DIAGNOSIS — Z008 Encounter for other general examination: Secondary | ICD-10-CM | POA: Diagnosis not present

## 2021-06-02 DIAGNOSIS — E039 Hypothyroidism, unspecified: Secondary | ICD-10-CM | POA: Diagnosis not present

## 2021-06-02 DIAGNOSIS — Z803 Family history of malignant neoplasm of breast: Secondary | ICD-10-CM | POA: Diagnosis not present

## 2021-06-02 DIAGNOSIS — G20 Parkinson's disease: Secondary | ICD-10-CM | POA: Diagnosis not present

## 2021-06-02 DIAGNOSIS — K219 Gastro-esophageal reflux disease without esophagitis: Secondary | ICD-10-CM | POA: Diagnosis not present

## 2021-06-02 DIAGNOSIS — H259 Unspecified age-related cataract: Secondary | ICD-10-CM | POA: Diagnosis not present

## 2021-07-04 IMAGING — CT CT HEAD W/O CM
4 series · 17 of 47 positions shown, 19 images · non-contrast
Comparison: Brain MRI 06/28/2006

CLINICAL DATA: Head trauma fall

EXAM:
CT HEAD WITHOUT CONTRAST
TECHNIQUE: Contiguous axial images were obtained from the base of the skull
through the vertex without intravenous contrast.

[Series 2: head wo · axial · 0.47mm/px · z∈[-95,+25]mm · 7 of 33 slices shown, 9 images]
[im 5/33  brain]
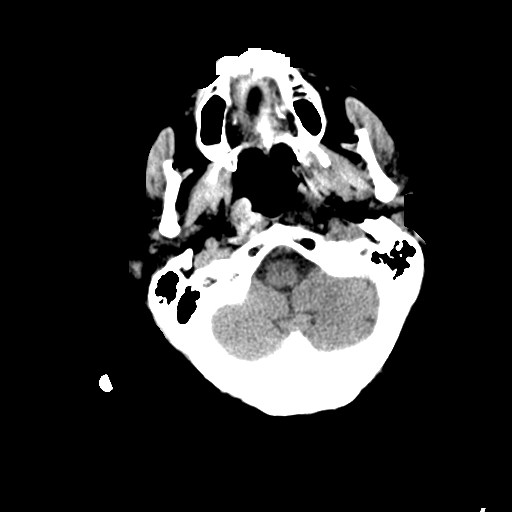
[im 5/33  bone]
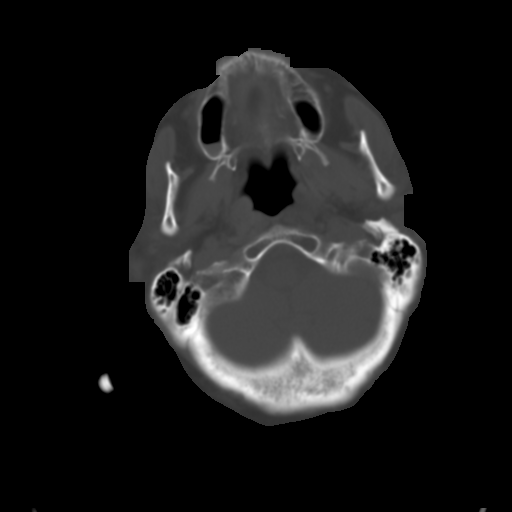
[im 9/33  brain]
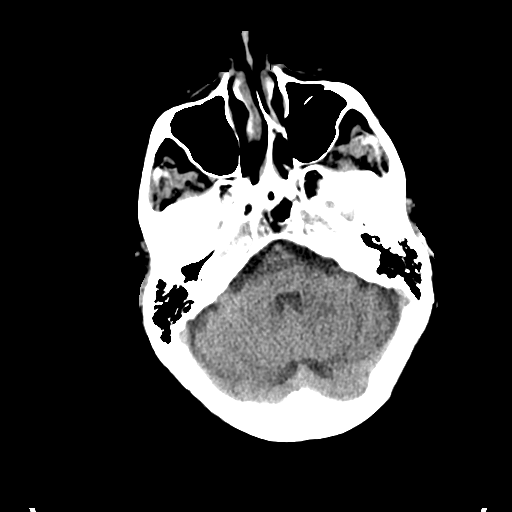
[im 13/33  brain]
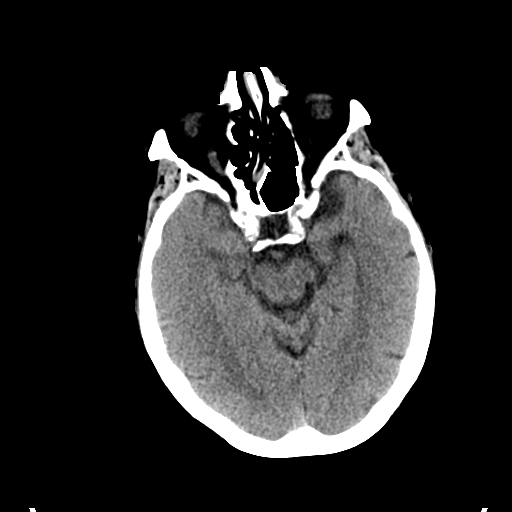
[im 17/33  brain]
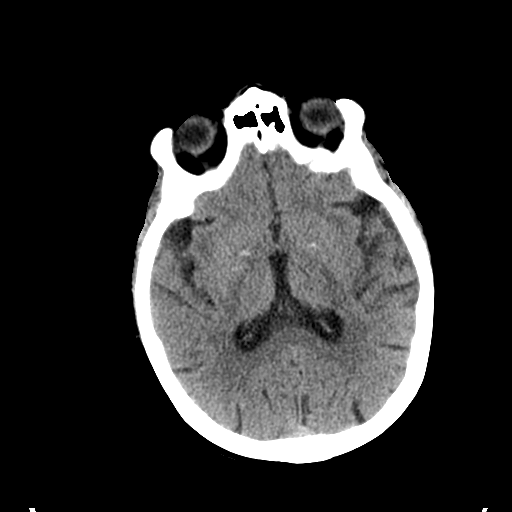
[im 21/33  brain]
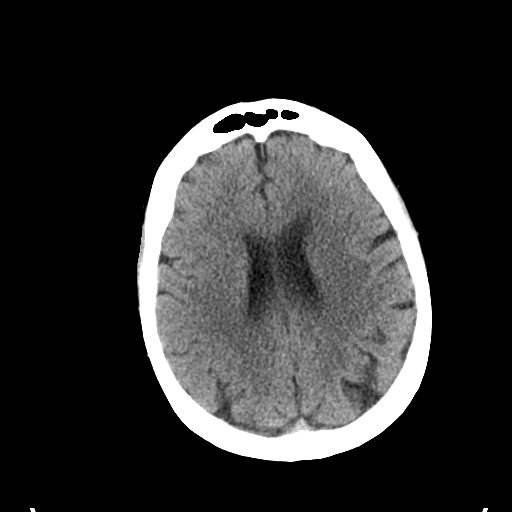
[im 21/33  bone]
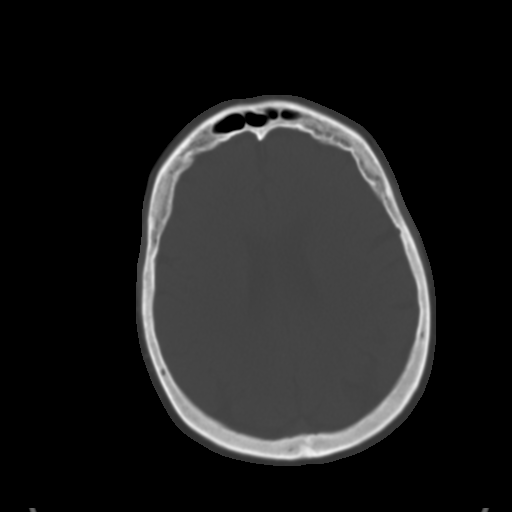
[im 25/33  brain]
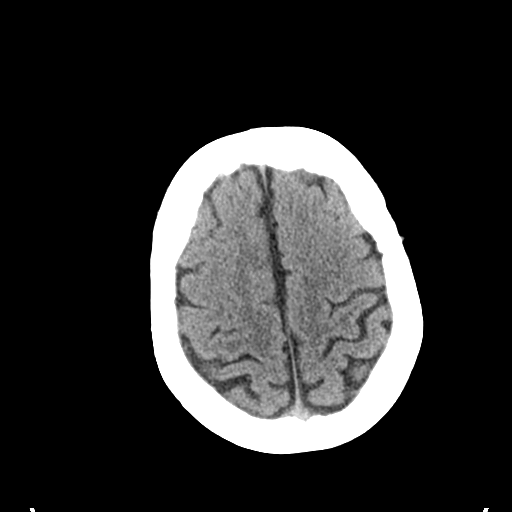
[im 29/33  brain]
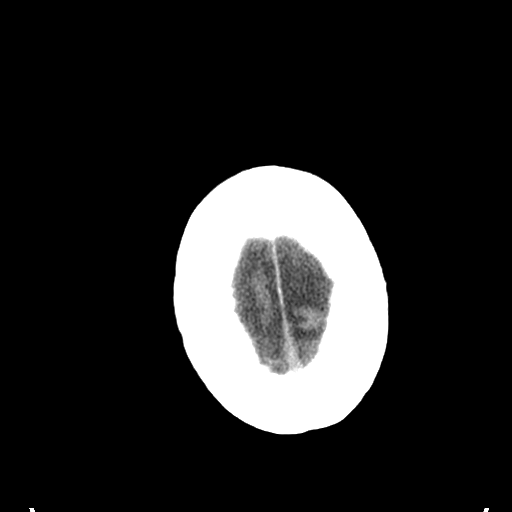

[Series 3: head bone · axial · 0.47mm/px · z∈[-99,-43]mm · 4 of 83 slices shown]
[im 9/83  bone]
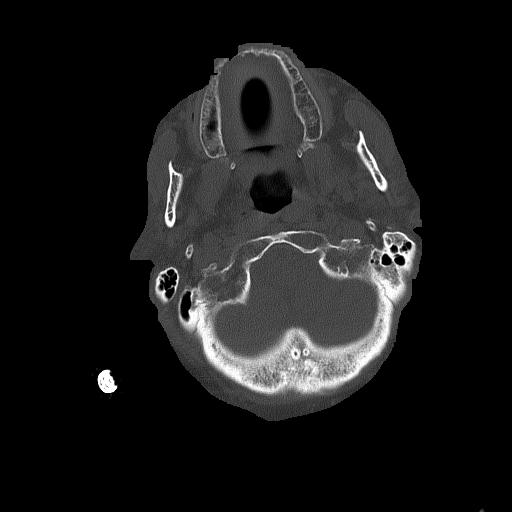
[im 17/83  bone]
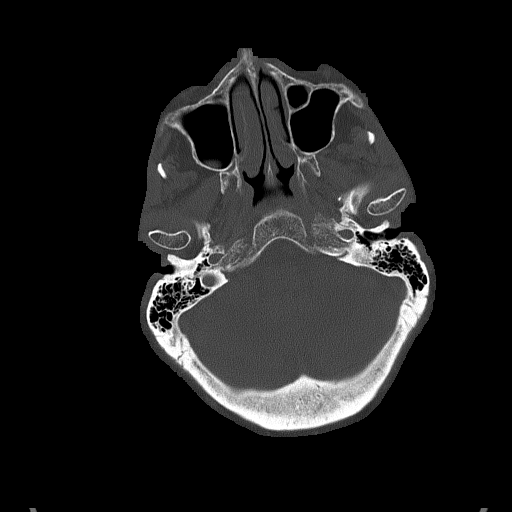
[im 25/83  bone]
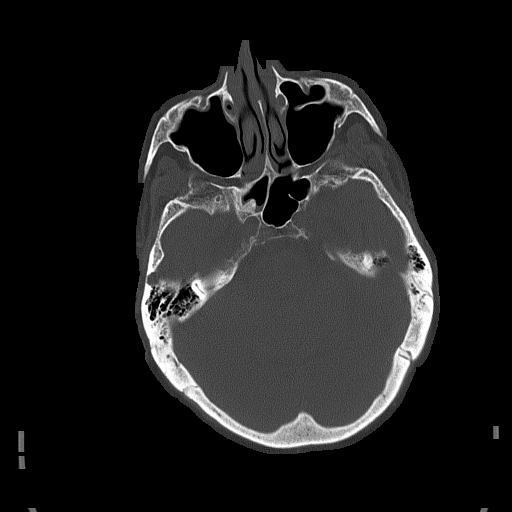
[im 37/83  bone]
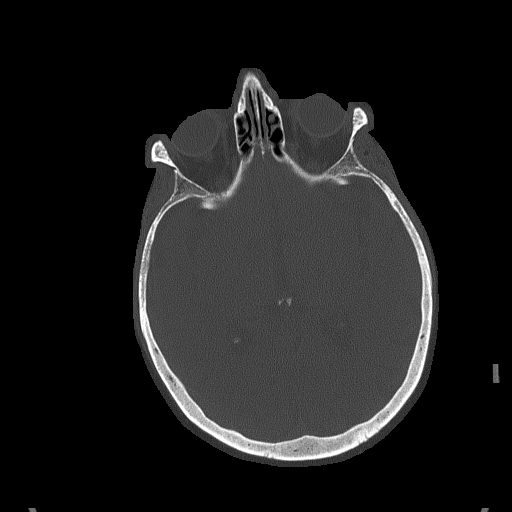

[Series 4: coronal soft tissue · coronal · 0.30mm/px · 3 of 65 slices shown]
[im 22/65  brain]
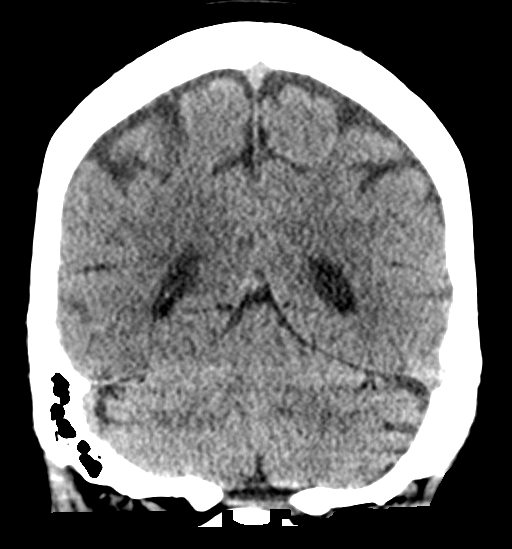
[im 29/65  brain]
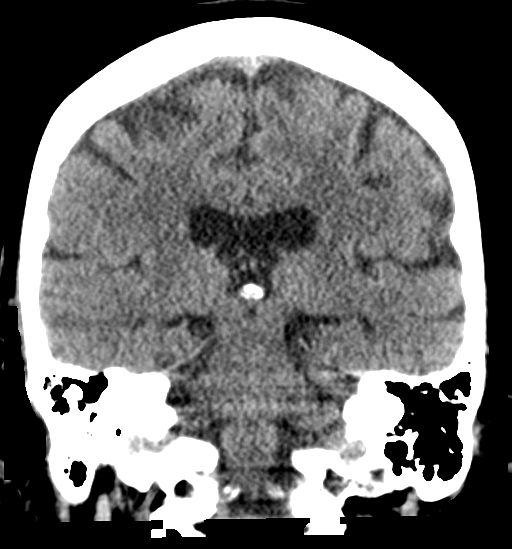
[im 36/65  brain]
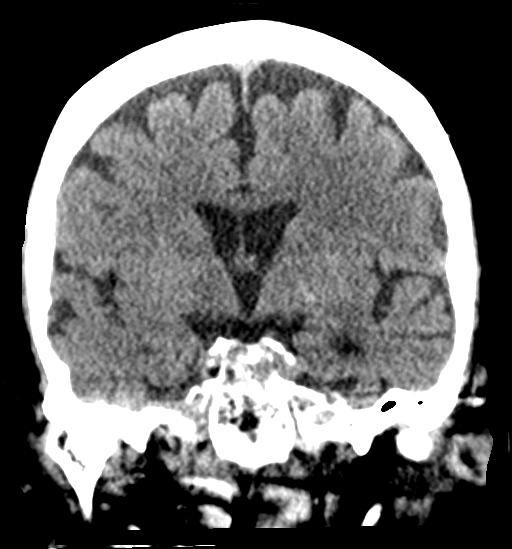

[Series 5: sagittal soft tissue · sagittal · 0.32mm/px · 3 of 50 slices shown]
[im 17/50  brain]
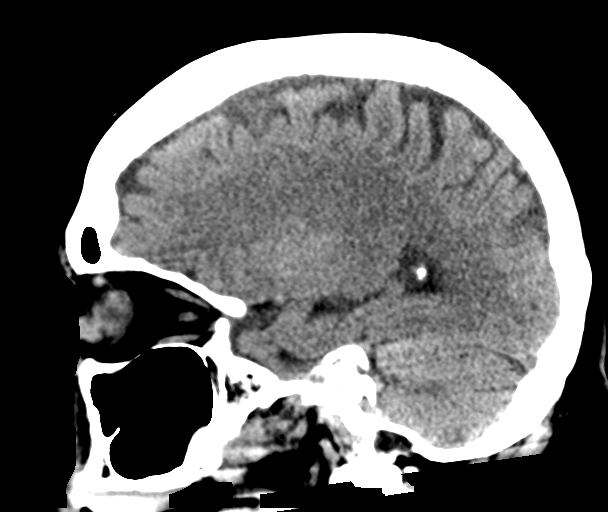
[im 25/50  brain]
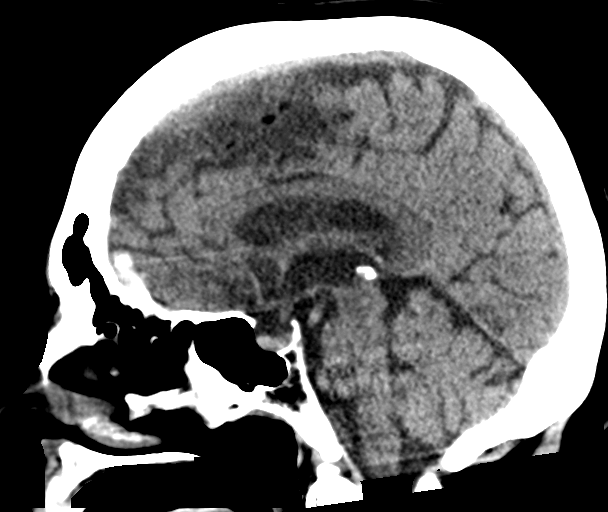
[im 33/50  brain]
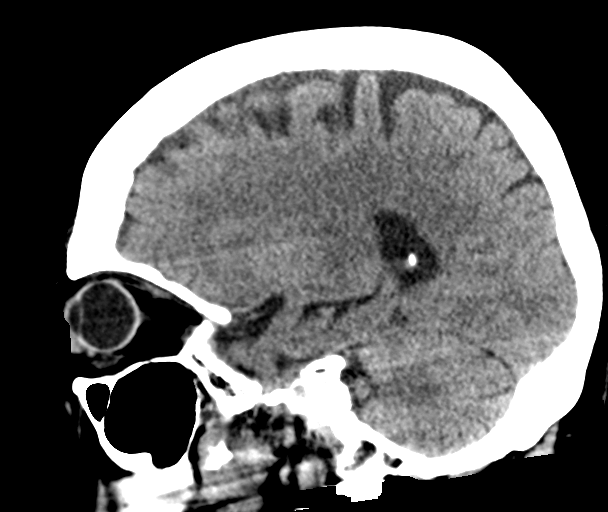

[17 of 47 positions shown; findings below may reference images not displayed]

FINDINGS: Brain: No acute territorial infarction, hemorrhage or intracranial
mass. Mild atrophy. Nonenlarged ventricles.

Vascular: No hyperdense vessels.  Carotid vascular calcification.

Skull: Normal. Negative for fracture or focal lesion.

Sinuses/Orbits: Mucosal thickening in the maxillary sinuses.
Partially calcified soft tissue density or small mass at the right
superior nasal passage.

Other: None
IMPRESSION: 1. No CT evidence for acute intracranial abnormality.
2. Atrophy.

## 2021-07-06 IMAGING — DX DG CHEST 1V PORT
1 series · 1 of 1 positions shown · non-contrast
Comparison: 11/01/2019

CLINICAL DATA: Fall 3 days ago, dyspnea

EXAM:
PORTABLE CHEST 1 VIEW

[chest ap]
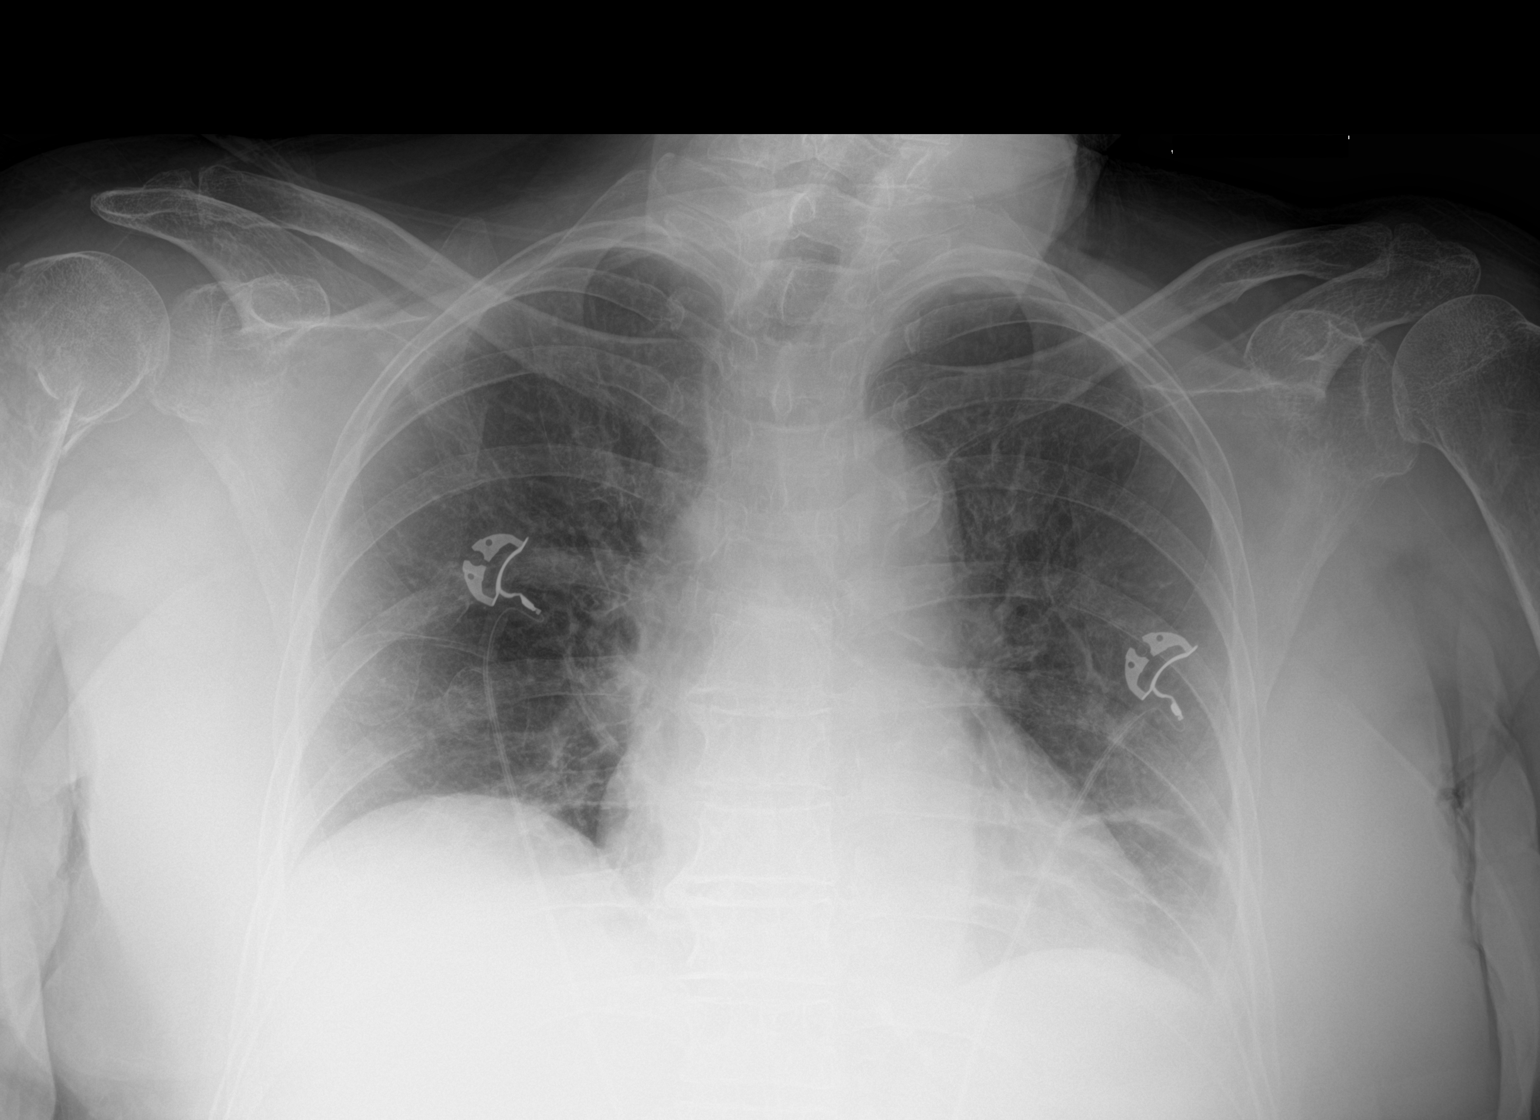

[1 of 1 positions shown; findings below may reference images not displayed]

FINDINGS: Mild atelectasis in the left lower lung. Right lung is clear. No
pleural effusion or pneumothorax.

The heart is normal in size.

Right humeral neck fracture, unchanged.
IMPRESSION: Mild atelectasis in the left lower lung.

Right femoral neck fracture, unchanged.

## 2021-08-19 NOTE — Progress Notes (Unsigned)
No chief complaint on file.  HISTORY OF PRESENT ILLNESS:  08/19/21 ALL:  Tami Vega returns for follow up for PD. She was last seen by Dr Rexene Alberts 03/2021. She reported more stress at home due to husbands illness. Her son had moved in with them to help provide care. Sinemet 1 tablet 5 times daily, Comtan TID and Azilect 61m daily continued.   10/14/2020 ALL:  Tami Vega a 81y.o. female here today for follow up for PD. She was last seen by Dr ARexene Alberts3/2022 and continued on Sinemet 1 tablet 5 times daily, Azilect 1 tablet daily and entacapone 1 tablet TID. She was participating in home PT and advised to increase water intake and use walker. Since, she reports doing fairly well. No recent falls. She is using her walker. Balance seems to be fairly stable. Tremor is very mild. She is tolerating medications well and without adverse effects. She feels that she is doing fairly well. She has good days and bad days. She is eating normally. Some foods do not taste as good over the past few months. She drinks a protein shake every day. She tries to drink at least 3 bottles of water. She reports having shoulder pain last night that was unusual but is much better, today. She denies trouble breathing or chest pain. She has had some congestion and a tickle cough, no fever or other respiratory symptoms.    HISTORY (copied from Dr AGuadelupe Sabinprevious note)  Ms. HCatapanois a very pleasant 81year old right-handed woman with an underlying medical history of hyperlipidemia, history of pituitary microadenoma, precancerous breast lesions status post double mastectomy in the 1980s, who presents for follow-up consultation of her left-sided predominant Parkinson's disease. The patient is accompanied by her sister, Tami Vega today. I last saw her on 02/12/2020, at which time we talked about the importance of fall prevention.  She had fallen in August 2021 and broke her proximal right humerus.  She was treated conservatively.  She  was in rehab and subsequently had home health therapy.  She has ongoing stress.  She was advised to continue her Parkinson's medications.   Today, 06/10/2020: She reports having had some issues with her balance.  She has fallen about 3 times, most recently, she fell on the weekend on the way to her bed, she hit the wood floor with her forehead.  She did not lose consciousness and did not have any headache subsequently but does have some discomfort.  She had a small swelling in the left forehead and a slight remnant of it.  She reports that she has not been drinking water as well as she should.  She was told by her primary care nurse practitioner that she was dehydrated.  She had some blood work through her primary care recently, she is supposed to see Tami Major NP again in April.  She has ongoing stress, worries about her husband, sometimes she gets frustrated.  She indicates that she continues to take her antidepressant medication.  She is currently in home health PT.  Her youngest son, lives about 5 minutes away, her older son lives about 10 minutes away.  Tami Vega lives about 2 to 3 minutes away and Tami Vega in on her typically on Tuesdays and Thursdays.   The patient's allergies, current medications, family history, past medical history, past social history, past surgical history and problem list were reviewed and updated as appropriate.    Previously (copied from previous notes for reference):  I saw her on 10/10/2019, at which time she reported that her Sinemet was not lasting as long.  She was taking 1 pill 4 times a day.  She also had more stress what with her husband's declining health and memory loss.   I saw her on 04/12/2019, at which time she reported tolerating Comtan.  We mutually agreed to increase it to 1 pill 3 times daily.  She was advised to continue with Sinemet and Azilect and we talked about potentially utilizing inbrija potentially down the road.   I saw her on  12/07/2018, at which time she reported taking Comtan only once daily at 11 AM.  She felt that she was too sedated when she took it twice daily but she was willing to try it again.  I suggest that she added for the 3 PM dose.  We kept her Sinemet the same as well as the Azilect.    I saw her on 09/05/2018, at which time she reported feeling more slower, she had had a few times of feeling lightheaded upon standing up too quickly.  She had a fall in the kitchen.  She was taking Sinemet 4 times a day.  She was more fatigued by the end of the day.  Constipation was under control.  She had had some stable hallucinations, nothing bothersome.  I suggested a cautious addition of Comtan for 2 of her 4 doses of Sinemet.  She called back in July 2020 indicating that she felt more unsteady since starting the Comtan and she was advised to reduce the Comtan to only once daily with 1 of her Sinemet doses.   I saw her on 03/02/2018, at which time she reported that she was not able to tolerate the Sinemet long-acting.  She had problems with insomnia, she was no longer on it.  We had started it in or around June 2019.  She was generally taking Sinemet 4 times a day.  She was having more difficulty in the mornings in terms of mobility.  She was advised to take a middle of the night dose of Sinemet for total of 5 pills/day, the middle of the night dose anywhere between 11 PM and 4 AM.  We talked about the importance of monitoring her driving.  She had limited her driving.   I saw her on 08/30/2017, at which time she reported having had one fall in the past 6 months. She fell in the kitchen. She unfortunately cracked a couple of ribs, had an x-ray through PCP. She noticed that she was more stiffer in the mornings and had difficulty moving at times, shuffling more. We talked about the importance of fall prevention and the importance of being proactive about constipation. She was advised to hydrate well with water and continue with  Sinemet 1 pill 4 times a day at 7, 11, 3 and 7. I asked her to add Sinemet CR 50-200 milligrams strength at bedtime. She was advised to continue with Azilect once daily as well.   She called a few days after that in the interim reporting that she felt more sleepy after starting the Sinemet CR. I suggested we could reduce it to 25-100 milligrams strength long-acting but she declined.   I saw her on 02/22/17, at which time she reported doing okay, sometimes she felt lightheaded. She had had a few falls, with a tendency to fall backwards when she stood up too quickly. For her occasional constipation she was using MiraLAX as needed about twice  a week. I advised her to continue her current Parkinson's disease medication regimen including Sinemet 1 pill 4 times a day and Azilect 1 pill once daily, talked about fall risk and gait safety.   I saw her on 08/18/2016, at which time she reported increase in tremor on the left side. She had some difficulty with sleep at times. She had not tried any over-the-counter medication. She had a fall about 3 months prior in the yard. Thankfully she did not sustain any Vega injuries. She did bruise her right thigh. I suggested she try to increase her Sinemet to 1 pill 4 times a day and keep her Azilect at 1 mg once daily. She was advised to try melatonin for sleep.   I saw her on 02/19/2016, at which time she reported overall doing quite well, she was active, sewing and making crafts. She had no recent falls but had noticed lightheadedness particularly upon standing up quickly. She had a near fall experience recently at a restaurant. She was not always hydrating well. Mood and memory were stable. Motor-wise, she felt stable as well. I suggested we continue with her medication regimen, including Azilect and Sinemet.Marland Kitchen She was furthermore advised to stay better hydrated and change positions slowly, also start using compression stockings.   I saw her on 08/12/2015, at which time  she reported feeling fairly stable. She was going to the gym about 4 times a week, had lost weight, mostly because of change in taste/lack of taste. Also, she cut out sodas and has been watching what she eats, trying to drink more water. Overall, motor-wise she felt stable, had mild short-term memory issues but no mood or sinister memory issues, no recent falls. Sometimes her left hand tremor was worse. She has 3 grown children and 3 grandchildren. She was on low-dose Lexapro. I suggested she continue with Azilect once daily and Sinemet 1 pill 3 times a day.   I saw her on 02/11/2015, at which time she reported doing better with the recent addition of Sinemet. She felt she had more energy and mobility was better as well. She needed refills on paper prescriptions because of changing her pharmacies. Her daughter reported that she had stopped taking her antidepressant for a couple of months but her family had noticed more problems with her depression and anxiety and she restarted her medication. She was trying to stay active. She is trying to drink enough water. She did have additional stressors lately because her son was involved in a car accident and was still in the hospital at the time. Overall, however, she had done fairly well. We mutually agreed to continue her on Azilect 1 mg once daily and Sinemet 1 pill 3 times a day.   I saw her on 10/16/2014, at which time she reported that her tremor was a little worse. She also felt that her walking was worse. She had a harder time picking up her feet, particularly on the left side. Thankfully she had not fallen. She was sleeping a little better since switching her antidepressant to nighttime. I asked her to continue with Azilect once daily but also to add a small dose of Sinemet starting with half a pill twice daily with gradual titration to 1 pill 3 times a day.   I saw her on 04/17/2014, at which time she reported doing a little better with regards to her  tremor. She noticed a flareup of her tremor with nervousness. She was still able to use her sewing  machine. She had no side effects from her medications. She did find out that she had 2 female cousins on her father's side with Parkinson's disease. I asked her to continue Azilect 1 mg once daily.   I first met her on 10/20/2013 at the request of her primary care physician, at which time she gave a 6-12 month history of left upper extremity tremor. She also reported problems with fine motor skills on the left. Her history and physical exam are in keeping with mild parkinsonism, probably left eye predominant Parkinson's disease. I suggested further workup with a brain MRI and starting her on Azilect. She did not have a brain scan done.   She has noted L sided issues with fine motor skills, resting tremor on the LUE, she feels easily fatigued and it is difficult for her to pick up her L leg sometimes. Thankfully, she has not fallen, but her balance is off at times. She goes to [employer] 4 times a week, but has less stamina. She is a life long non-smoker, does not drink alcohol and has no history of head injury, exposure to pesticides, but did work in a Emergency planning/management officer for 33 years and she [place]. She has no FHx of PD or tremors.    REVIEW OF SYSTEMS: Out of a complete 14 system review of symptoms, the patient complains only of the following symptoms, fatigue, left hand tremor, imbalance, shoulder pain and all other reviewed systems are negative.   ALLERGIES: No Known Allergies   HOME MEDICATIONS: Outpatient Medications Prior to Visit  Medication Sig Dispense Refill   b complex vitamins tablet Take 1 tablet by mouth daily.     Calcium Carbonate-Vit D-Min (CALCIUM 1200 PO) Take by mouth daily. Take one tablet a day     carbidopa-levodopa (SINEMET IR) 25-100 MG tablet Take 1 tablet by mouth 5 (five) times daily. Take at  7 AM, 10 AM, 1 PM, 4 PM and  7 PM 450 tablet 3   cholecalciferol (VITAMIN D) 25  MCG tablet Take 1 tablet (1,000 Units total) by mouth daily. 30 tablet    D-Mannose 500 MG CAPS Take by mouth. 3 per day     entacapone (COMTAN) 200 MG tablet Take 1 tablet (200 mg total) by mouth 3 (three) times daily. Take with Sinemet dose at 7 AM, 1 PM and 7 PM. 270 tablet 3   escitalopram (LEXAPRO) 10 MG tablet Take 10 mg by mouth daily.  4   fish oil-omega-3 fatty acids 1000 MG capsule Take 2 g by mouth daily.     levothyroxine (SYNTHROID) 88 MCG tablet Take 88 mcg by mouth daily before breakfast.     Multiple Vitamin (MULTIVITAMIN WITH MINERALS) TABS tablet Take 1 tablet by mouth daily.     OMEPRAZOLE PO Take by mouth.     polyethylene glycol (MIRALAX / GLYCOLAX) 17 g packet Take 17 g by mouth daily as needed.     rasagiline (AZILECT) 1 MG TABS tablet Take 1 tablet (1 mg total) by mouth daily. 90 tablet 3   senna-docusate (SENOKOT-S) 8.6-50 MG tablet Take 2 tablets by mouth at bedtime as needed for mild constipation or moderate constipation.     No facility-administered medications prior to visit.     PAST MEDICAL HISTORY: Past Medical History:  Diagnosis Date   Abnormal involuntary movements(781.0)    Disorder of bone and cartilage, unspecified    Elevated blood pressure reading without diagnosis of hypertension  Vega depressive disorder, single episode, unspecified    Other musculoskeletal symptoms referable to limbs(729.89)    Pain in joint, lower leg    Pure hypercholesterolemia      PAST SURGICAL HISTORY: Past Surgical History:  Procedure Laterality Date   BREAST SURGERY  1980's   benign cyst   COLONOSCOPY  1995, 1998   VAGINAL HYSTERECTOMY  1975     FAMILY HISTORY: Family History  Problem Relation Age of Onset   CAD Mother    Breast cancer Mother    CAD Father    Parkinson's disease Father    Tremor Sister    Hyperlipidemia Sister    Hypertension Sister    CAD Brother    Breast cancer Maternal Aunt    Colon cancer Maternal Aunt      SOCIAL  HISTORY: Social History   Socioeconomic History   Marital status: Married    Spouse name: Not on file   Number of children: Not on file   Years of education: Not on file   Highest education level: Not on file  Occupational History   Not on file  Tobacco Use   Smoking status: Never   Smokeless tobacco: Never  Substance and Sexual Activity   Alcohol use: No    Alcohol/week: 0.0 standard drinks   Drug use: No   Sexual activity: Not on file  Other Topics Concern   Not on file  Social History Narrative   Not on file   Social Determinants of Health   Financial Resource Strain: Not on file  Food Insecurity: Not on file  Transportation Needs: Not on file  Physical Activity: Not on file  Stress: Not on file  Social Connections: Not on file  Intimate Partner Violence: Not on file     PHYSICAL EXAM  There were no vitals filed for this visit.  There is no height or weight on file to calculate BMI.   Generalized: Well developed, in no acute distress  Cardiology: normal rate and rhythm, no murmur auscultated  Respiratory: clear to auscultation bilaterally    Neurological examination  Mentation: Alert oriented to time, place, history taking. Follows all commands speech and language fluent but hypophonic Cranial nerve II-XII: Pupils were equal round reactive to light. Extraocular movements show saccadic breakdown. No nystagmus.  visual field were full on confrontational test. Facial sensation and strength were normal. Uvula tongue midline. Head turning and shoulder shrug  were normal and symmetric. Motor: The motor testing reveals 5 over 5 strength of all 4 extremities. Tone is more rigid with left upper extremity cogwheeling. Mild bradykinesia noted. Left upper ext resting tremor Sensory: Sensory testing is intact to soft touch on all 4 extremities. No evidence of extinction is noted.  Coordination: Cerebellar testing reveals good finger-nose-finger and heel-to-shin bilaterally.   Gait and station: Gait is stable with walker. Tandem not attempted  Reflexes: Deep tendon reflexes are symmetric and normal bilaterally.    DIAGNOSTIC DATA (LABS, IMAGING, TESTING) - I reviewed patient records, labs, notes, testing and imaging myself where available.  Lab Results  Component Value Date   WBC 7.7 11/02/2019   HGB 10.2 (L) 11/02/2019   HCT 30.2 (L) 11/02/2019   MCV 91.2 11/02/2019   PLT 156 11/02/2019      Component Value Date/Time   NA 135 11/07/2019 0508   K 4.0 11/07/2019 0508   CL 102 11/07/2019 0508   CO2 24 11/07/2019 0508   GLUCOSE 103 (H) 11/07/2019 0508   BUN 25 (  H) 11/07/2019 0508   CREATININE 0.86 11/07/2019 0508   CALCIUM 8.5 (L) 11/07/2019 0508   PROT 6.7 11/01/2019 2354   ALBUMIN 4.2 11/01/2019 2354   AST 11 (L) 11/01/2019 2354   ALT 9 11/01/2019 2354   ALKPHOS 47 11/01/2019 2354   BILITOT 1.3 (H) 11/01/2019 2354   GFRNONAA >60 11/07/2019 0508   GFRAA >60 11/07/2019 0508   No results found for: CHOL, HDL, LDLCALC, LDLDIRECT, TRIG, CHOLHDL No results found for: HGBA1C No results found for: VITAMINB12 Lab Results  Component Value Date   TSH 1.608 11/02/2019        View : No data to display.               View : No data to display.           ASSESSMENT AND PLAN  81 y.o. year old female  has a past medical history of Abnormal involuntary movements(781.0), Disorder of bone and cartilage, unspecified, Elevated blood pressure reading without diagnosis of hypertension, Vega depressive disorder, single episode, unspecified, Other musculoskeletal symptoms referable to limbs(729.89), Pain in joint, lower leg, and Pure hypercholesterolemia. here with    No diagnosis found.  Tami Vega is doing well from a neurological perspective. Parkinson symptoms are stable on current treatment. We will continue generic Sinemet 1 tablet five times daily, generic Azilect 63m daily and generic Comtan 2012mTID. She was encouraged to continue healthy  lifestyle habits. Adequate hydration discussed. Fall precautions reviewed. She was advised to monitor shoulder pain closely and notify primary care provider for any worsening or unusual symptoms as discussed. She and her sister, LiVaughan Bastaverbalize understanding. I will alternate her care with Dr AtRexene Albertsnd have her return to see usKorean 6 months. She verbalizes understanding and agreement with this plan.   No orders of the defined types were placed in this encounter.     No orders of the defined types were placed in this encounter.    AmDebbora PrestoMSN, FNP-C 08/19/2021, 5:17 PM  [facility] [id number] StreetSuWestvillerSnowflake[place] [phone]

## 2021-08-19 NOTE — Patient Instructions (Signed)
Below is our plan:  We will continue current treatment plan. I will discuss adding an extended release Sinemet in addition to the immediate release you are already taking. Please discuss concerns of anxiety/stress with Tami Vega, PCP.   Please make sure you are staying well hydrated. I recommend 50-60 ounces daily. Well balanced diet and regular exercise encouraged. Consistent sleep schedule with 6-8 hours recommended.   Please continue follow up with care team as directed.   Follow up with Tami Vega in 6 months  You may receive a survey regarding today's visit. I encourage you to leave honest feed back as I do use this information to improve patient care. Thank you for seeing me today!   Management of Memory Problems   There are some general things you can do to help manage your memory problems.  Your memory may not in fact recover, but by using techniques and strategies you will be able to manage your memory difficulties better.   1)  Establish a routine. Try to establish and then stick to a regular routine.  By doing this, you will get used to what to expect and you will reduce the need to rely on your memory.  Also, try to do things at the same time of day, such as taking your medication or checking your calendar first thing in the morning. Think about think that you can do as a part of a regular routine and make a list.  Then enter them into a daily planner to remind you.  This will help you establish a routine.   2)  Organize your environment. Organize your environment so that it is uncluttered.  Decrease visual stimulation.  Place everyday items such as keys or cell phone in the same place every day (ie.  Basket next to front door) Use post it notes with a brief message to yourself (ie. Turn off light, lock the door) Use labels to indicate where things go (ie. Which cupboards are for food, dishes, etc.) Keep a notepad and pen by the telephone to take messages   3)  Memory Aids A diary  or journal/notebook/daily planner Making a list (shopping list, chore list, to do list that needs to be done) Using an alarm as a reminder (kitchen timer or cell phone alarm) Using cell phone to store information (Notes, Calendar, Reminders) Calendar/White board placed in a prominent position Post-it notes   In order for memory aids to be useful, you need to have good habits.  It's no good remembering to make a note in your journal if you don't remember to look in it.  Try setting aside a certain time of day to look in journal.   4)  Improving mood and managing fatigue. There may be other factors that contribute to memory difficulties.  Factors, such as anxiety, depression and tiredness can affect memory. Regular gentle exercise can help improve your mood and give you more energy. Simple relaxation techniques may help relieve symptoms of anxiety Try to get back to completing activities or hobbies you enjoyed doing in the past. Learn to pace yourself through activities to decrease fatigue. Find out about some local support groups where you can share experiences with others. Try and achieve 7-8 hours of sleep at night.

## 2021-08-20 ENCOUNTER — Encounter: Payer: Self-pay | Admitting: Family Medicine

## 2021-08-20 ENCOUNTER — Ambulatory Visit: Payer: Medicare HMO | Admitting: Family Medicine

## 2021-08-20 ENCOUNTER — Telehealth: Payer: Self-pay | Admitting: Family Medicine

## 2021-08-20 ENCOUNTER — Other Ambulatory Visit: Payer: Self-pay | Admitting: Neurology

## 2021-08-20 VITALS — BP 149/83 | HR 70 | Ht 67.0 in | Wt 160.0 lb

## 2021-08-20 DIAGNOSIS — G20 Parkinson's disease: Secondary | ICD-10-CM

## 2021-08-20 DIAGNOSIS — Z9181 History of falling: Secondary | ICD-10-CM | POA: Diagnosis not present

## 2021-08-20 DIAGNOSIS — R269 Unspecified abnormalities of gait and mobility: Secondary | ICD-10-CM | POA: Diagnosis not present

## 2021-08-20 DIAGNOSIS — G249 Dystonia, unspecified: Secondary | ICD-10-CM | POA: Diagnosis not present

## 2021-08-20 DIAGNOSIS — Y92009 Unspecified place in unspecified non-institutional (private) residence as the place of occurrence of the external cause: Secondary | ICD-10-CM

## 2021-08-20 DIAGNOSIS — R293 Abnormal posture: Secondary | ICD-10-CM

## 2021-08-20 MED ORDER — CARBIDOPA-LEVODOPA 25-100 MG PO TBDP: ORAL_TABLET | ORAL | 3 refills | Status: DC

## 2021-08-20 MED ORDER — CARBIDOPA-LEVODOPA 25-100 MG PO TBDP: 1.0000 | ORAL_TABLET | Freq: Three times a day (TID) | ORAL | 3 refills | Status: DC

## 2021-08-20 NOTE — Telephone Encounter (Signed)
Tami Vega with [facility] is going to take this patient. 

## 2021-08-20 NOTE — Telephone Encounter (Signed)
Referral was put in for Physical Therapy in the comments it states for in home PT. If that is the case the referral needs to be put in as a home health referral.

## 2021-08-20 NOTE — Telephone Encounter (Signed)
Order for home health PT has been entered for the pt

## 2021-09-16 DIAGNOSIS — R69 Illness, unspecified: Secondary | ICD-10-CM | POA: Diagnosis not present

## 2021-09-16 DIAGNOSIS — G20 Parkinson's disease: Secondary | ICD-10-CM | POA: Diagnosis not present

## 2021-09-16 DIAGNOSIS — E039 Hypothyroidism, unspecified: Secondary | ICD-10-CM | POA: Diagnosis not present

## 2021-09-16 DIAGNOSIS — H6123 Impacted cerumen, bilateral: Secondary | ICD-10-CM | POA: Diagnosis not present

## 2021-09-16 DIAGNOSIS — E559 Vitamin D deficiency, unspecified: Secondary | ICD-10-CM | POA: Diagnosis not present

## 2021-09-29 DIAGNOSIS — F419 Anxiety disorder, unspecified: Secondary | ICD-10-CM | POA: Diagnosis not present

## 2021-09-29 DIAGNOSIS — R069 Unspecified abnormalities of breathing: Secondary | ICD-10-CM | POA: Diagnosis not present

## 2021-09-29 DIAGNOSIS — R69 Illness, unspecified: Secondary | ICD-10-CM | POA: Diagnosis not present

## 2021-09-29 DIAGNOSIS — I10 Essential (primary) hypertension: Secondary | ICD-10-CM | POA: Diagnosis not present

## 2021-09-29 DIAGNOSIS — R609 Edema, unspecified: Secondary | ICD-10-CM | POA: Diagnosis not present

## 2021-09-29 DIAGNOSIS — R0602 Shortness of breath: Secondary | ICD-10-CM | POA: Diagnosis not present

## 2021-09-29 DIAGNOSIS — Z743 Need for continuous supervision: Secondary | ICD-10-CM | POA: Diagnosis not present

## 2021-09-29 DIAGNOSIS — R070 Pain in throat: Secondary | ICD-10-CM | POA: Diagnosis not present

## 2021-10-02 ENCOUNTER — Emergency Department (HOSPITAL_COMMUNITY): Payer: Medicare HMO

## 2021-10-02 ENCOUNTER — Other Ambulatory Visit: Payer: Self-pay

## 2021-10-02 ENCOUNTER — Encounter (HOSPITAL_COMMUNITY): Payer: Self-pay

## 2021-10-02 ENCOUNTER — Ambulatory Visit (HOSPITAL_COMMUNITY)
Admission: EM | Admit: 2021-10-02 | Discharge: 2021-10-02 | Disposition: A | Discharge status: Other Acute Inpatient Hospital | Payer: Medicare HMO | Attending: Emergency Medicine | Admitting: Emergency Medicine

## 2021-10-02 ENCOUNTER — Emergency Department (HOSPITAL_COMMUNITY)
Admission: EM | Admit: 2021-10-02 | Discharge: 2021-10-03 | Disposition: A | Discharge status: Home / Self Care | Payer: Medicare HMO | Attending: Emergency Medicine | Admitting: Emergency Medicine

## 2021-10-02 DIAGNOSIS — R6884 Jaw pain: Secondary | ICD-10-CM

## 2021-10-02 DIAGNOSIS — S0093XA Contusion of unspecified part of head, initial encounter: Secondary | ICD-10-CM | POA: Diagnosis not present

## 2021-10-02 DIAGNOSIS — Y92009 Unspecified place in unspecified non-institutional (private) residence as the place of occurrence of the external cause: Secondary | ICD-10-CM | POA: Insufficient documentation

## 2021-10-02 DIAGNOSIS — S0990XA Unspecified injury of head, initial encounter: Secondary | ICD-10-CM | POA: Diagnosis not present

## 2021-10-02 DIAGNOSIS — G20 Parkinson's disease: Secondary | ICD-10-CM | POA: Diagnosis not present

## 2021-10-02 DIAGNOSIS — R531 Weakness: Secondary | ICD-10-CM | POA: Insufficient documentation

## 2021-10-02 DIAGNOSIS — R69 Illness, unspecified: Secondary | ICD-10-CM | POA: Diagnosis not present

## 2021-10-02 DIAGNOSIS — S2231XA Fracture of one rib, right side, initial encounter for closed fracture: Secondary | ICD-10-CM | POA: Diagnosis not present

## 2021-10-02 DIAGNOSIS — R102 Pelvic and perineal pain: Secondary | ICD-10-CM | POA: Diagnosis not present

## 2021-10-02 DIAGNOSIS — F419 Anxiety disorder, unspecified: Secondary | ICD-10-CM | POA: Diagnosis not present

## 2021-10-02 DIAGNOSIS — S0083XA Contusion of other part of head, initial encounter: Secondary | ICD-10-CM

## 2021-10-02 DIAGNOSIS — S0181XA Laceration without foreign body of other part of head, initial encounter: Secondary | ICD-10-CM | POA: Diagnosis not present

## 2021-10-02 DIAGNOSIS — W182XXA Fall in (into) shower or empty bathtub, initial encounter: Secondary | ICD-10-CM | POA: Insufficient documentation

## 2021-10-02 DIAGNOSIS — Z23 Encounter for immunization: Secondary | ICD-10-CM | POA: Insufficient documentation

## 2021-10-02 DIAGNOSIS — S199XXA Unspecified injury of neck, initial encounter: Secondary | ICD-10-CM | POA: Diagnosis not present

## 2021-10-02 DIAGNOSIS — W19XXXA Unspecified fall, initial encounter: Secondary | ICD-10-CM

## 2021-10-02 DIAGNOSIS — S01512A Laceration without foreign body of oral cavity, initial encounter: Secondary | ICD-10-CM | POA: Diagnosis not present

## 2021-10-02 DIAGNOSIS — R079 Chest pain, unspecified: Secondary | ICD-10-CM | POA: Diagnosis not present

## 2021-10-02 DIAGNOSIS — S0993XA Unspecified injury of face, initial encounter: Secondary | ICD-10-CM | POA: Diagnosis not present

## 2021-10-02 LAB — CBC WITH DIFFERENTIAL/PLATELET
Abs Immature Granulocytes: 0.04 10*3/uL (ref 0.00–0.07)
Basophils Absolute: 0 10*3/uL (ref 0.0–0.1)
Basophils Relative: 0 %
Eosinophils Absolute: 0.2 10*3/uL (ref 0.0–0.5)
Eosinophils Relative: 2 %
HCT: 39.7 % (ref 36.0–46.0)
Hemoglobin: 14 g/dL (ref 12.0–15.0)
Immature Granulocytes: 0 %
Lymphocytes Relative: 16 %
Lymphs Abs: 1.9 10*3/uL (ref 0.7–4.0)
MCH: 30.4 pg (ref 26.0–34.0)
MCHC: 35.3 g/dL (ref 30.0–36.0)
MCV: 86.3 fL (ref 80.0–100.0)
Monocytes Absolute: 2.2 10*3/uL — ABNORMAL HIGH (ref 0.1–1.0)
Monocytes Relative: 19 %
Neutro Abs: 7.1 10*3/uL (ref 1.7–7.7)
Neutrophils Relative %: 63 %
Platelets: 190 10*3/uL (ref 150–400)
RBC: 4.6 MIL/uL (ref 3.87–5.11)
RDW: 12.8 % (ref 11.5–15.5)
WBC: 11.5 10*3/uL — ABNORMAL HIGH (ref 4.0–10.5)
nRBC: 0 % (ref 0.0–0.2)

## 2021-10-02 LAB — TROPONIN I (HIGH SENSITIVITY): Troponin I (High Sensitivity): 4 ng/L (ref <18)

## 2021-10-02 LAB — COMPREHENSIVE METABOLIC PANEL
ALT: 11 U/L (ref 0–44)
AST: 18 U/L (ref 15–41)
Albumin: 3.8 g/dL (ref 3.5–5.0)
Alkaline Phosphatase: 43 U/L (ref 38–126)
Anion gap: 9 (ref 5–15)
BUN: 25 mg/dL — ABNORMAL HIGH (ref 8–23)
CO2: 20 mmol/L — ABNORMAL LOW (ref 22–32)
Calcium: 8.9 mg/dL (ref 8.9–10.3)
Chloride: 111 mmol/L (ref 98–111)
Creatinine, Ser: 0.83 mg/dL (ref 0.44–1.00)
GFR, Estimated: >60 mL/min (ref >60)
Glucose, Bld: 103 mg/dL — ABNORMAL HIGH (ref 70–99)
Potassium: 3.5 mmol/L (ref 3.5–5.1)
Sodium: 140 mmol/L (ref 135–145)
Total Bilirubin: 1.2 mg/dL (ref 0.3–1.2)
Total Protein: 6.3 g/dL — ABNORMAL LOW (ref 6.5–8.1)

## 2021-10-02 LAB — LIPASE, BLOOD: Lipase: 36 U/L (ref 11–51)

## 2021-10-02 MED ORDER — TETANUS-DIPHTH-ACELL PERTUSSIS 5-2.5-18.5 LF-MCG/0.5 IM SUSY
0.5000 mL | PREFILLED_SYRINGE | Freq: Once | INTRAMUSCULAR | Status: AC
Start: 2021-10-02 — End: 2021-10-02
  Administered 2021-10-02: 0.5 mL via INTRAMUSCULAR
  Filled 2021-10-02: qty 0.5

## 2021-10-02 MED ORDER — LIDOCAINE HCL (PF) 1 % IJ SOLN
5.0000 mL | Freq: Once | INTRAMUSCULAR | Status: AC
  Administered 2021-10-02: 5 mL
  Filled 2021-10-02: qty 5

## 2021-10-02 MED ORDER — SODIUM CHLORIDE 0.9 % IV BOLUS
1000.0000 mL | Freq: Once | INTRAVENOUS | Status: AC
  Administered 2021-10-02: 1000 mL via INTRAVENOUS

## 2021-10-02 NOTE — ED Notes (Signed)
Received verbal report from Johanna V RN at this time 

## 2021-10-02 NOTE — Discharge Instructions (Signed)
D/C to ED via [facility] 

## 2021-10-02 NOTE — ED Provider Notes (Signed)
Emergency Department Provider Note   I have reviewed the triage vital signs and the nursing notes.   HISTORY  Chief Complaint No chief complaint on file.   HPI Tami Vega is a 81 y.o. female presents to the emergency department for evaluation after fall at home.  Patient states that her foot slipped while in the bathroom and she fell landing with her chin and face on the sink.  No loss of consciousness.  She notes some bleeding from the mouth and is having bruising and pain to the left jaw.  She is not anticoagulated.  Her son is at bedside and states that he is noticed some increased gait rigidity in the setting of Parkinson's.  Patient's not feeling short of breath.  She denies severe headache or neck pain.  Unsure of her last tetanus shot.  She initially presented to urgent care but was found to have initial systolic blood pressure of 80.  IV was started and she received 100 mL of IV fluid with repeat pressure of 148/75.  Patient states she is been feeling slightly weak but no syncope or presyncope symptoms. No CP or palpitations.    Past Medical History:  Diagnosis Date   Abnormal involuntary movements(781.0)    Disorder of bone and cartilage, unspecified    Elevated blood pressure reading without diagnosis of hypertension    Major depressive disorder, single episode, unspecified    Other musculoskeletal symptoms referable to limbs(729.89)    Pain in joint, lower leg    Pure hypercholesterolemia     Review of Systems  Constitutional: No fever/chills Eyes: No visual changes. ENT: No sore throat. Left jaw pain and swelling. Internal mouth laceration.  Cardiovascular: Denies chest pain. Respiratory: Denies shortness of breath. Gastrointestinal: No abdominal pain.  No nausea, no vomiting.  No diarrhea.  No constipation. Genitourinary: Negative for dysuria. Musculoskeletal: Negative for back pain. Skin: Negative for rash. Neurological: Negative for headaches, focal  weakness or numbness.  ____________________________________________   PHYSICAL EXAM:  VITAL SIGNS: ED Triage Vitals  Enc Vitals Group     BP 10/02/21 1900 (!) 161/77     Pulse Rate 10/02/21 1850 71     Resp 10/02/21 1850 (!) 35     Temp 10/02/21 1850 98 F (36.7 C)     Temp src --      SpO2 10/02/21 1850 100 %     Weight 10/02/21 1901 153 lb (69.4 kg)     Height 10/02/21 1901 5\' 7"  (1.702 m)   Constitutional: Alert and oriented. Well appearing and in no acute distress. Eyes: Conjunctivae are normal.  Head: Atraumatic. Nose: No congestion/rhinnorhea. Mouth/Throat: Mucous membranes are moist.  Oropharynx non-erythematous.  Patient with a 2 cm laceration along the mucosal surface of the mandible/lower lip. No active bleeding.  Neck: No stridor.  Cardiovascular: Normal rate, regular rhythm. Good peripheral circulation. Grossly normal heart sounds.   Respiratory: Normal respiratory effort.  No retractions. Lungs CTAB. Gastrointestinal: Soft and nontender. No distention. Musculoskeletal: No lower extremity tenderness nor edema. No gross deformities of extremities. Neurologic: Speech and overall tone consistent with Parkinson's disease. No focal neuro deficits. Skin:  Skin is warm, dry and intact. No rash noted.  ____________________________________________   LABS (all labs ordered are listed, but only abnormal results are displayed)  Labs Reviewed  COMPREHENSIVE METABOLIC PANEL - Abnormal; Notable for the following components:      Result Value   CO2 20 (*)    Glucose, Bld 103 (*)  BUN 25 (*)    Total Protein 6.3 (*)    All other components within normal limits  CBC WITH DIFFERENTIAL/PLATELET - Abnormal; Notable for the following components:   WBC 11.5 (*)    Monocytes Absolute 2.2 (*)    All other components within normal limits  LIPASE, BLOOD  TROPONIN I (HIGH SENSITIVITY)   ____________________________________________   PROCEDURES  Procedure(s) performed:    Marland KitchenMarland KitchenLaceration Repair  Date/Time: 10/05/2021 6:58 AM  Performed by: Maia Plan, MD Authorized by: Maia Plan, MD   Consent:    Consent obtained:  Verbal   Consent given by:  Patient   Risks, benefits, and alternatives were discussed: yes     Risks discussed:  Need for additional repair, nerve damage, infection, pain, poor cosmetic result, poor wound healing, vascular damage and retained foreign body Universal protocol:    Patient identity confirmed:  Hospital-assigned identification number Anesthesia:    Anesthesia method:  Local infiltration   Local anesthetic:  Lidocaine 1% w/o epi Laceration details:    Location:  Mouth   Length (cm):  2 Pre-procedure details:    Preparation:  Imaging obtained to evaluate for foreign bodies and patient was prepped and draped in usual sterile fashion Exploration:    Limited defect created (wound extended): no     Imaging outcome: foreign body not noted     Wound exploration: wound explored through full range of motion and entire depth of wound visualized     Wound extent: no areolar tissue violation noted, no fascia violation noted, no foreign bodies/material noted, no nerve damage noted, no underlying fracture noted and no vascular damage noted     Contaminated: no   Treatment:    Area cleansed with:  Saline   Amount of cleaning:  Standard   Irrigation solution:  Sterile saline   Irrigation method:  Pressure wash   Visualized foreign bodies/material removed: no     Debridement:  None   Undermining:  None   Scar revision: no   Skin repair:    Repair method:  Sutures   Suture size:  5-0   Suture material:  Plain gut   Suture technique:  Simple interrupted   Number of sutures:  2 Approximation:    Approximation:  Close Repair type:    Repair type:  Simple Post-procedure details:    Dressing:  Open (no dressing)   Procedure completion:  Tolerated well, no immediate  complications    ____________________________________________   INITIAL IMPRESSION / ASSESSMENT AND PLAN / ED COURSE  Pertinent labs & imaging results that were available during my care of the patient were reviewed by me and considered in my medical decision making (see chart for details).   This patient is Presenting for Evaluation of head injury, which does require a range of treatment options, and is a complaint that involves a high risk of morbidity and mortality.  The Differential Diagnoses includes subdural hematoma, epidural hematoma, acute concussion, traumatic subarachnoid hemorrhage, cerebral contusions, etc.   Critical Interventions-    Medications  Tdap (BOOSTRIX) injection 0.5 mL (0.5 mLs Intramuscular Given 10/02/21 1940)  lidocaine (PF) (XYLOCAINE) 1 % injection 5 mL (5 mLs Infiltration Given 10/02/21 2240)    Reassessment after intervention: Symptoms improved.    I did obtain Additional Historical Information from son at bedside.  I decided to review pertinent External Data, and in summary patient briefly evaluated at [facility] just prior to ED arrival.   Clinical Laboratory Tests Ordered, included troponin normal.  Mild leukocytosis.  No anemia.  No acute kidney injury or severe electrolyte disturbance.  Radiologic Tests Ordered, included CXR, pelvis x-ray, CT head/max-face/c spine. I independently interpreted the images and agree with radiology interpretation.   Cardiac Monitor Tracing which shows NSR.   Social Determinants of Health Risk patient is a non-smoker.  Consult complete with  Medical Decision Making: Summary:  Patient presents emergency department for evaluation after fall.  She describes mechanical fall with her foot slipping and falling hitting the sink.  She has a laceration inside of her cheek which is hemostatic.  Does not appear to be full-thickness.  Will need imaging to evaluate for mandible fracture or other facial fractures.  Given patient's age  do plan for CT imaging of the head and cervical spine as well.   Reevaluation with update and discussion with patient and family. Imaging is reassuring. Plan for d/c with close PCP follow up.   Considered admission but patient doing well. Laceration repaired. Plan for PCP follow up. Defer admit for now.   Disposition: discharge  ____________________________________________  FINAL CLINICAL IMPRESSION(S) / ED DIAGNOSES  Final diagnoses:  Fall, initial encounter  Laceration of floor of mouth, initial encounter  Contusion of face, initial encounter  Weakness    Note:  This document was prepared using Dragon voice recognition software and may include unintentional dictation errors.  Alona Bene, MD, [facility] Emergency Medicine    Tayshawn Purnell, Arlyss Repress, MD 10/05/21 [phone]

## 2021-10-02 NOTE — ED Notes (Signed)
Pt transported to ct at this time 

## 2021-10-02 NOTE — ED Triage Notes (Signed)
[facility]; fell in bathroom from standing, struck mouth on sink; no broken teeth; controlled bleeding from mouth, not obstructing airway; per staff, systolic 80; 20 ga rac, bp 148/75 after 100 mls of NS; lungs clear and equal; HR 60 sinus, gcs 15, 98% RA; bruising to L jaw, normal movement, no deformity noted by ems; pt denies tenderness to palpation; no loc. Not on thinners

## 2021-10-02 NOTE — ED Notes (Signed)
[facility] present to transport  Pt to ED.

## 2021-10-02 NOTE — ED Notes (Signed)
Provider at bedside

## 2021-10-02 NOTE — Discharge Instructions (Signed)
You were seen in the emergency room today after a fall.  I repaired a small laceration inside your mouth and the sutures will dissolve on their own.  After eating or drinking you should swish with water to prevent food particles from getting down into the wound.  If you develop worsening pain or redness to this area should return for reevaluation.

## 2021-10-02 NOTE — ED Triage Notes (Signed)
Onset today- patient reports a fall in the bathroom about 1 hour ago. She reports hitting her head and mouth. Pt has several laceration on the inside of her mouth. Per: Lurena Joiner patient needs to be transported to [facility] ED for further work-up.

## 2021-10-02 NOTE — ED Notes (Signed)
Pt in ct at this time ° °

## 2021-10-02 NOTE — ED Provider Notes (Signed)
Presents after a fall in the bathroom. She hit her chin on something. Did not lose consciousness. Laceration inside the mouth. Able to open jaw.  History of falls, Parkinson disease. Medication review does not reveal blood thinner.  Alert and answering questions. Appears pale. Denies any pain apart from the jaw. Not dizzy or lightheaded.   BP on arrival 86/52. Peripheral line inserted right AC, NS bolus started.  BP recheck 124/80.  Patient requires further evaluation and higher level of care. Contacted [facility] for transport.  Discharged to ED via [facility].   Jake Fuhrmann, Ray Church 10/02/21 1812

## 2021-10-02 NOTE — ED Notes (Signed)
SON took Pt's purse to car while waiting for [facility] .

## 2021-10-05 ENCOUNTER — Other Ambulatory Visit: Payer: Self-pay

## 2021-10-05 ENCOUNTER — Encounter (HOSPITAL_COMMUNITY): Payer: Self-pay

## 2021-10-05 ENCOUNTER — Inpatient Hospital Stay (HOSPITAL_COMMUNITY)
Admission: EM | Admit: 2021-10-05 | Discharge: 2021-10-10 | DRG: 690 | Disposition: A | Discharge status: Skilled Nursing Facility | Payer: Medicare HMO | Attending: Internal Medicine | Admitting: Internal Medicine

## 2021-10-05 DIAGNOSIS — E7800 Pure hypercholesterolemia, unspecified: Secondary | ICD-10-CM | POA: Diagnosis present

## 2021-10-05 DIAGNOSIS — N390 Urinary tract infection, site not specified: Principal | ICD-10-CM | POA: Diagnosis present

## 2021-10-05 DIAGNOSIS — I5032 Chronic diastolic (congestive) heart failure: Secondary | ICD-10-CM | POA: Diagnosis present

## 2021-10-05 DIAGNOSIS — Z803 Family history of malignant neoplasm of breast: Secondary | ICD-10-CM

## 2021-10-05 DIAGNOSIS — R55 Syncope and collapse: Secondary | ICD-10-CM | POA: Diagnosis not present

## 2021-10-05 DIAGNOSIS — G20 Parkinson's disease: Secondary | ICD-10-CM | POA: Diagnosis present

## 2021-10-05 DIAGNOSIS — E039 Hypothyroidism, unspecified: Secondary | ICD-10-CM | POA: Diagnosis present

## 2021-10-05 DIAGNOSIS — S0990XA Unspecified injury of head, initial encounter: Secondary | ICD-10-CM

## 2021-10-05 DIAGNOSIS — S01512D Laceration without foreign body of oral cavity, subsequent encounter: Secondary | ICD-10-CM

## 2021-10-05 DIAGNOSIS — M542 Cervicalgia: Secondary | ICD-10-CM | POA: Diagnosis not present

## 2021-10-05 DIAGNOSIS — I951 Orthostatic hypotension: Secondary | ICD-10-CM | POA: Diagnosis present

## 2021-10-05 DIAGNOSIS — Z820 Family history of epilepsy and other diseases of the nervous system: Secondary | ICD-10-CM

## 2021-10-05 DIAGNOSIS — Z79890 Hormone replacement therapy (postmenopausal): Secondary | ICD-10-CM

## 2021-10-05 DIAGNOSIS — S0003XA Contusion of scalp, initial encounter: Secondary | ICD-10-CM | POA: Diagnosis present

## 2021-10-05 DIAGNOSIS — Z8249 Family history of ischemic heart disease and other diseases of the circulatory system: Secondary | ICD-10-CM

## 2021-10-05 DIAGNOSIS — W1830XD Fall on same level, unspecified, subsequent encounter: Secondary | ICD-10-CM

## 2021-10-05 DIAGNOSIS — Z9181 History of falling: Secondary | ICD-10-CM

## 2021-10-05 DIAGNOSIS — Z83438 Family history of other disorder of lipoprotein metabolism and other lipidemia: Secondary | ICD-10-CM

## 2021-10-05 DIAGNOSIS — F32A Depression, unspecified: Secondary | ICD-10-CM | POA: Diagnosis present

## 2021-10-05 DIAGNOSIS — Z79899 Other long term (current) drug therapy: Secondary | ICD-10-CM

## 2021-10-05 DIAGNOSIS — R519 Headache, unspecified: Secondary | ICD-10-CM | POA: Diagnosis not present

## 2021-10-05 DIAGNOSIS — W1830XA Fall on same level, unspecified, initial encounter: Secondary | ICD-10-CM | POA: Diagnosis present

## 2021-10-05 DIAGNOSIS — I10 Essential (primary) hypertension: Secondary | ICD-10-CM | POA: Diagnosis not present

## 2021-10-05 DIAGNOSIS — Z800 Family history of malignant neoplasm of digestive organs: Secondary | ICD-10-CM

## 2021-10-05 DIAGNOSIS — G20A1 Parkinson's disease without dyskinesia, without mention of fluctuations: Secondary | ICD-10-CM | POA: Diagnosis present

## 2021-10-05 DIAGNOSIS — I959 Hypotension, unspecified: Secondary | ICD-10-CM | POA: Diagnosis not present

## 2021-10-05 DIAGNOSIS — E876 Hypokalemia: Secondary | ICD-10-CM | POA: Diagnosis present

## 2021-10-05 DIAGNOSIS — E860 Dehydration: Secondary | ICD-10-CM | POA: Diagnosis present

## 2021-10-05 DIAGNOSIS — Z743 Need for continuous supervision: Secondary | ICD-10-CM | POA: Diagnosis not present

## 2021-10-05 DIAGNOSIS — R000 Tachycardia, unspecified: Secondary | ICD-10-CM | POA: Diagnosis not present

## 2021-10-05 DIAGNOSIS — B9620 Unspecified Escherichia coli [E. coli] as the cause of diseases classified elsewhere: Secondary | ICD-10-CM | POA: Diagnosis present

## 2021-10-05 LAB — CBC
HCT: 37.1 % (ref 36.0–46.0)
Hemoglobin: 12.9 g/dL (ref 12.0–15.0)
MCH: 30.9 pg (ref 26.0–34.0)
MCHC: 34.8 g/dL (ref 30.0–36.0)
MCV: 88.8 fL (ref 80.0–100.0)
Platelets: 182 10*3/uL (ref 150–400)
RBC: 4.18 MIL/uL (ref 3.87–5.11)
RDW: 13.1 % (ref 11.5–15.5)
WBC: 8.3 10*3/uL (ref 4.0–10.5)
nRBC: 0 % (ref 0.0–0.2)

## 2021-10-05 NOTE — ED Triage Notes (Addendum)
Patient BIB [facility] from home. Syncopal episode. Larey Seat and hit the back of her head. Knot present, no laceration. No blood thinners. Just seen on Thursday for the same thing. Patient already bruising on her chin from previous fall.

## 2021-10-06 ENCOUNTER — Observation Stay (HOSPITAL_BASED_OUTPATIENT_CLINIC_OR_DEPARTMENT_OTHER): Payer: Medicare HMO

## 2021-10-06 ENCOUNTER — Emergency Department (HOSPITAL_COMMUNITY): Payer: Medicare HMO

## 2021-10-06 ENCOUNTER — Encounter (HOSPITAL_COMMUNITY): Payer: Self-pay | Admitting: Family Medicine

## 2021-10-06 DIAGNOSIS — R55 Syncope and collapse: Secondary | ICD-10-CM | POA: Diagnosis not present

## 2021-10-06 DIAGNOSIS — N3000 Acute cystitis without hematuria: Secondary | ICD-10-CM | POA: Diagnosis not present

## 2021-10-06 DIAGNOSIS — R519 Headache, unspecified: Secondary | ICD-10-CM | POA: Diagnosis not present

## 2021-10-06 DIAGNOSIS — G20 Parkinson's disease: Secondary | ICD-10-CM

## 2021-10-06 DIAGNOSIS — E039 Hypothyroidism, unspecified: Secondary | ICD-10-CM

## 2021-10-06 DIAGNOSIS — N390 Urinary tract infection, site not specified: Secondary | ICD-10-CM | POA: Diagnosis present

## 2021-10-06 DIAGNOSIS — S0003XA Contusion of scalp, initial encounter: Secondary | ICD-10-CM | POA: Diagnosis not present

## 2021-10-06 DIAGNOSIS — M542 Cervicalgia: Secondary | ICD-10-CM | POA: Diagnosis not present

## 2021-10-06 DIAGNOSIS — E038 Other specified hypothyroidism: Secondary | ICD-10-CM

## 2021-10-06 LAB — CBC WITH DIFFERENTIAL/PLATELET
Abs Immature Granulocytes: 0.02 10*3/uL (ref 0.00–0.07)
Basophils Absolute: 0 10*3/uL (ref 0.0–0.1)
Basophils Relative: 0 %
Eosinophils Absolute: 0.2 10*3/uL (ref 0.0–0.5)
Eosinophils Relative: 2 %
HCT: 38 % (ref 36.0–46.0)
Hemoglobin: 13.2 g/dL (ref 12.0–15.0)
Immature Granulocytes: 0 %
Lymphocytes Relative: 21 %
Lymphs Abs: 1.5 10*3/uL (ref 0.7–4.0)
MCH: 30.8 pg (ref 26.0–34.0)
MCHC: 34.7 g/dL (ref 30.0–36.0)
MCV: 88.8 fL (ref 80.0–100.0)
Monocytes Absolute: 1.6 10*3/uL — ABNORMAL HIGH (ref 0.1–1.0)
Monocytes Relative: 23 %
Neutro Abs: 3.7 10*3/uL (ref 1.7–7.7)
Neutrophils Relative %: 54 %
Platelets: 190 10*3/uL (ref 150–400)
RBC: 4.28 MIL/uL (ref 3.87–5.11)
RDW: 13.2 % (ref 11.5–15.5)
WBC: 6.9 10*3/uL (ref 4.0–10.5)
nRBC: 0 % (ref 0.0–0.2)

## 2021-10-06 LAB — URINALYSIS, ROUTINE W REFLEX MICROSCOPIC
Bilirubin Urine: NEGATIVE
Glucose, UA: NEGATIVE mg/dL
Hgb urine dipstick: NEGATIVE
Ketones, ur: 5 mg/dL — AB
Nitrite: POSITIVE — AB
Protein, ur: NEGATIVE mg/dL
Specific Gravity, Urine: 1.006 (ref 1.005–1.030)
pH: 6 (ref 5.0–8.0)

## 2021-10-06 LAB — HEPATIC FUNCTION PANEL
ALT: 7 U/L (ref 0–44)
AST: 16 U/L (ref 15–41)
Albumin: 3.7 g/dL (ref 3.5–5.0)
Alkaline Phosphatase: 41 U/L (ref 38–126)
Bilirubin, Direct: 0.1 mg/dL (ref 0.0–0.2)
Indirect Bilirubin: 1.4 mg/dL — ABNORMAL HIGH (ref 0.3–0.9)
Total Bilirubin: 1.5 mg/dL — ABNORMAL HIGH (ref 0.3–1.2)
Total Protein: 6.1 g/dL — ABNORMAL LOW (ref 6.5–8.1)

## 2021-10-06 LAB — COMPREHENSIVE METABOLIC PANEL
ALT: 6 U/L (ref 0–44)
AST: 14 U/L — ABNORMAL LOW (ref 15–41)
Albumin: 3.7 g/dL (ref 3.5–5.0)
Alkaline Phosphatase: 38 U/L (ref 38–126)
Anion gap: 6 (ref 5–15)
BUN: 25 mg/dL — ABNORMAL HIGH (ref 8–23)
CO2: 22 mmol/L (ref 22–32)
Calcium: 8 mg/dL — ABNORMAL LOW (ref 8.9–10.3)
Chloride: 114 mmol/L — ABNORMAL HIGH (ref 98–111)
Creatinine, Ser: 0.64 mg/dL (ref 0.44–1.00)
GFR, Estimated: >60 mL/min (ref >60)
Glucose, Bld: 94 mg/dL (ref 70–99)
Potassium: 3.4 mmol/L — ABNORMAL LOW (ref 3.5–5.1)
Sodium: 142 mmol/L (ref 135–145)
Total Bilirubin: 1.3 mg/dL — ABNORMAL HIGH (ref 0.3–1.2)
Total Protein: 5.9 g/dL — ABNORMAL LOW (ref 6.5–8.1)

## 2021-10-06 LAB — BASIC METABOLIC PANEL
Anion gap: 7 (ref 5–15)
BUN: 37 mg/dL — ABNORMAL HIGH (ref 8–23)
CO2: 24 mmol/L (ref 22–32)
Calcium: 8.7 mg/dL — ABNORMAL LOW (ref 8.9–10.3)
Chloride: 107 mmol/L (ref 98–111)
Creatinine, Ser: 0.78 mg/dL (ref 0.44–1.00)
GFR, Estimated: >60 mL/min (ref >60)
Glucose, Bld: 108 mg/dL — ABNORMAL HIGH (ref 70–99)
Potassium: 3.3 mmol/L — ABNORMAL LOW (ref 3.5–5.1)
Sodium: 138 mmol/L (ref 135–145)

## 2021-10-06 LAB — MAGNESIUM: Magnesium: 2.1 mg/dL (ref 1.7–2.4)

## 2021-10-06 LAB — TROPONIN I (HIGH SENSITIVITY)
Troponin I (High Sensitivity): 3 ng/L (ref <18)
Troponin I (High Sensitivity): 2 ng/L (ref <18)

## 2021-10-06 LAB — ECHOCARDIOGRAM COMPLETE
Area-P 1/2: 2.93 cm2
Calc EF: 66.8 %
Height: 67 in
S' Lateral: 2.3 cm
Single Plane A2C EF: 66.6 %
Single Plane A4C EF: 66 %
Weight: 2447.99 oz

## 2021-10-06 LAB — PHOSPHORUS: Phosphorus: 2.8 mg/dL (ref 2.5–4.6)

## 2021-10-06 LAB — LIPASE, BLOOD: Lipase: 33 U/L (ref 11–51)

## 2021-10-06 MED ORDER — CARBIDOPA-LEVODOPA 25-100 MG PO TABS
1.0000 | ORAL_TABLET | Freq: Every day | ORAL | Status: DC
  Administered 2021-10-06 – 2021-10-10 (×24): 1 via ORAL
  Filled 2021-10-06 (×27): qty 1

## 2021-10-06 MED ORDER — ESCITALOPRAM OXALATE 10 MG PO TABS
10.0000 mg | ORAL_TABLET | Freq: Two times a day (BID) | ORAL | Status: DC
  Administered 2021-10-06 – 2021-10-10 (×9): 10 mg via ORAL
  Filled 2021-10-06 (×9): qty 1

## 2021-10-06 MED ORDER — PANTOPRAZOLE SODIUM 40 MG PO TBEC
40.0000 mg | DELAYED_RELEASE_TABLET | Freq: Every day | ORAL | Status: DC
  Administered 2021-10-06 – 2021-10-10 (×5): 40 mg via ORAL
  Filled 2021-10-06 (×5): qty 1

## 2021-10-06 MED ORDER — POLYETHYLENE GLYCOL 3350 17 G PO PACK: 17.0000 g | PACK | Freq: Every day | ORAL | Status: DC | PRN

## 2021-10-06 MED ORDER — POTASSIUM CHLORIDE CRYS ER 20 MEQ PO TBCR
20.0000 meq | EXTENDED_RELEASE_TABLET | Freq: Once | ORAL | Status: AC
  Administered 2021-10-06: 20 meq via ORAL
  Filled 2021-10-06: qty 1

## 2021-10-06 MED ORDER — SODIUM CHLORIDE 0.9 % IV BOLUS
500.0000 mL | Freq: Once | INTRAVENOUS | Status: AC
  Administered 2021-10-06: 500 mL via INTRAVENOUS

## 2021-10-06 MED ORDER — ENTACAPONE 200 MG PO TABS
200.0000 mg | ORAL_TABLET | Freq: Three times a day (TID) | ORAL | Status: DC
  Administered 2021-10-06 – 2021-10-10 (×14): 200 mg via ORAL
  Filled 2021-10-06 (×18): qty 1

## 2021-10-06 MED ORDER — LEVOTHYROXINE SODIUM 75 MCG PO TABS
75.0000 ug | ORAL_TABLET | Freq: Every morning | ORAL | Status: DC
  Administered 2021-10-06 – 2021-10-10 (×5): 75 ug via ORAL
  Filled 2021-10-06 (×5): qty 1

## 2021-10-06 MED ORDER — SODIUM CHLORIDE 0.9 % IV SOLN
1.0000 g | INTRAVENOUS | Status: DC
  Administered 2021-10-07 – 2021-10-08 (×2): 1 g via INTRAVENOUS
  Filled 2021-10-06 (×2): qty 10

## 2021-10-06 MED ORDER — ENOXAPARIN SODIUM 40 MG/0.4ML IJ SOSY
40.0000 mg | PREFILLED_SYRINGE | INTRAMUSCULAR | Status: DC
  Administered 2021-10-06 – 2021-10-10 (×5): 40 mg via SUBCUTANEOUS
  Filled 2021-10-06 (×5): qty 0.4

## 2021-10-06 MED ORDER — RASAGILINE MESYLATE 1 MG PO TABS
1.0000 mg | ORAL_TABLET | Freq: Every day | ORAL | Status: DC
  Administered 2021-10-06 – 2021-10-10 (×5): 1 mg via ORAL
  Filled 2021-10-06 (×5): qty 1

## 2021-10-06 MED ORDER — POTASSIUM CHLORIDE CRYS ER 20 MEQ PO TBCR
30.0000 meq | EXTENDED_RELEASE_TABLET | Freq: Once | ORAL | Status: AC
  Administered 2021-10-06: 30 meq via ORAL
  Filled 2021-10-06: qty 1

## 2021-10-06 MED ORDER — ACETAMINOPHEN 325 MG PO TABS: 650.0000 mg | ORAL_TABLET | Freq: Four times a day (QID) | ORAL | Status: DC | PRN

## 2021-10-06 MED ORDER — POTASSIUM CHLORIDE CRYS ER 20 MEQ PO TBCR: 30.0000 meq | EXTENDED_RELEASE_TABLET | Freq: Two times a day (BID) | ORAL | Status: DC

## 2021-10-06 MED ORDER — SODIUM CHLORIDE 0.9% FLUSH
3.0000 mL | Freq: Two times a day (BID) | INTRAVENOUS | Status: DC
  Administered 2021-10-06 – 2021-10-10 (×8): 3 mL via INTRAVENOUS

## 2021-10-06 MED ORDER — ACETAMINOPHEN 650 MG RE SUPP: 650.0000 mg | Freq: Four times a day (QID) | RECTAL | Status: DC | PRN

## 2021-10-06 MED ORDER — SODIUM CHLORIDE 0.9 % IV SOLN: INTRAVENOUS | Status: AC

## 2021-10-06 MED ORDER — SODIUM CHLORIDE 0.9 % IV SOLN
1.0000 g | Freq: Once | INTRAVENOUS | Status: AC
  Administered 2021-10-06: 1 g via INTRAVENOUS
  Filled 2021-10-06: qty 10

## 2021-10-06 NOTE — ED Provider Notes (Signed)
Emergency Department Provider Note   I have reviewed the triage vital signs and the nursing notes.   HISTORY  Chief Complaint Fall   HPI Tami Vega is a 81 y.o. female past history reviewed below presents to the emergency department with falls x2 today.  Patient is escorted by her son at bedside.  Simply that she may have passed out twice falling and hitting her head.  She was seen in the ED on 7/6 with fall, thought to be mechanical at that time.  She had a mouth laceration which was repaired and discharged home. No fever. No change in medications.  Son notes some swelling to the back of the scalp on exam but no bleeding or laceration.  Patient denies any complaints currently.  Past Medical History:  Diagnosis Date   Abnormal involuntary movements(781.0)    Disorder of bone and cartilage, unspecified    Elevated blood pressure reading without diagnosis of hypertension    Major depressive disorder, single episode, unspecified    Other musculoskeletal symptoms referable to limbs(729.89)    Pain in joint, lower leg    Pure hypercholesterolemia     Review of Systems  Constitutional: No fever/chills Eyes: No visual changes. ENT: No sore throat. Cardiovascular: Denies chest pain. Positive syncope x 2. Respiratory: Denies shortness of breath. Gastrointestinal: No abdominal pain.  No nausea, no vomiting.  No diarrhea.  No constipation. Genitourinary: Negative for dysuria. Musculoskeletal: Negative for back pain. Skin: Negative for rash. Neurological: Negative for focal weakness or numbness. Positive HA.    ____________________________________________   PHYSICAL EXAM:  VITAL SIGNS: ED Triage Vitals  Enc Vitals Group     BP 10/05/21 2313 126/65     Pulse Rate 10/05/21 2313 74     Resp 10/05/21 2313 20     Temp 10/05/21 2313 97.7 F (36.5 C)     Temp Source 10/05/21 2313 Oral     SpO2 10/05/21 2313 92 %   Constitutional: Alert and oriented. Well appearing and in  no acute distress. Eyes: Conjunctivae are normal. PERRL.  Head: Occipital scalp hematoma without laceration.  Nose: No congestion/rhinnorhea. Mouth/Throat: Mucous membranes are moist. Mouth laceration healed well.  Neck: No stridor.  No cervical spine tenderness to palpation. Cardiovascular: Normal rate, regular rhythm. Good peripheral circulation. Grossly normal heart sounds.   Respiratory: Normal respiratory effort.  No retractions. Lungs CTAB. Gastrointestinal: Soft and nontender. No distention.  Musculoskeletal: No lower extremity tenderness nor edema. No gross deformities of extremities. Neurologic:  Normal but slow speech and language. Increased overall rigidity with parkinson's history.  Skin:  Skin is warm, dry and intact. Old appearing bruising to the chin.    ____________________________________________   LABS (all labs ordered are listed, but only abnormal results are displayed)  Labs Reviewed  BASIC METABOLIC PANEL - Abnormal; Notable for the following components:      Result Value   Potassium 3.3 (*)    Glucose, Bld 108 (*)    BUN 37 (*)    Calcium 8.7 (*)    All other components within normal limits  URINALYSIS, ROUTINE W REFLEX MICROSCOPIC - Abnormal; Notable for the following components:   Ketones, ur 5 (*)    Nitrite POSITIVE (*)    Leukocytes,Ua SMALL (*)    Bacteria, UA FEW (*)    All other components within normal limits  HEPATIC FUNCTION PANEL - Abnormal; Notable for the following components:   Total Protein 6.1 (*)    Total Bilirubin 1.5 (*)  Indirect Bilirubin 1.4 (*)    All other components within normal limits  URINE CULTURE  CBC  LIPASE, BLOOD  TROPONIN I (HIGH SENSITIVITY)  TROPONIN I (HIGH SENSITIVITY)   ____________________________________________  EKG   EKG Interpretation  Date/Time:  Sunday October 05 2021 23:14:13 EDT Ventricular Rate:  73 PR Interval:  193 QRS Duration: 106 QT Interval:  409 QTC Calculation: 451 R  Axis:   33 Text Interpretation: Sinus rhythm Normal ECG When compared with ECG of 10/02/2021, No significant change was found Confirmed by Dione Booze ([id number]) on 10/06/2021 3:36:35 AM        ____________________________________________  RADIOLOGY  CT Head Wo Contrast  Result Date: 10/06/2021 CLINICAL DATA:  Repetitive falls with headaches and neck pain, initial encounter EXAM: CT HEAD WITHOUT CONTRAST CT CERVICAL SPINE WITHOUT CONTRAST TECHNIQUE: Multidetector CT imaging of the head and cervical spine was performed following the standard protocol without intravenous contrast. Multiplanar CT image reconstructions of the cervical spine were also generated. RADIATION DOSE REDUCTION: This exam was performed according to the departmental dose-optimization program which includes automated exposure control, adjustment of the mA and/or kV according to patient size and/or use of iterative reconstruction technique. COMPARISON:  10/02/2021 FINDINGS: CT HEAD FINDINGS Brain: No evidence of acute infarction, hemorrhage, hydrocephalus, extra-axial collection or mass lesion/mass effect. Mild atrophy is again identified. Basal ganglia calcifications are seen bilaterally. Findings of empty sella are again seen and stable. Vascular: No hyperdense vessel or unexpected calcification. Skull: Normal. Negative for fracture or focal lesion. Sinuses/Orbits: No acute finding. Other: Scalp hematoma is noted in the left posterior parietal region near the vertex consistent with the recent injury. CT CERVICAL SPINE FINDINGS Alignment: Within normal limits. Skull base and vertebrae: 7 cervical segments are well visualized. Vertebral body height is well maintained. Mild osteophytic changes are seen. Facet hypertrophic changes are noted throughout the cervical spine. No acute fracture or acute facet abnormality is noted. Soft tissues and spinal canal: Surrounding soft tissue structures are within normal limits. Upper chest: Visualized  lung apices are within normal limits. Other: None IMPRESSION: CT of the head: Chronic atrophic changes are again identified. No acute intracranial abnormality noted. New left parietal scalp hematoma near the vertex. CT of the cervical spine: Multilevel degenerative change without acute abnormality. The overall appearance is stable from the prior CT. Electronically Signed   By: Alcide Clever M.D.   On: 10/06/2021 02:05   CT Cervical Spine Wo Contrast  Result Date: 10/06/2021 CLINICAL DATA:  Repetitive falls with headaches and neck pain, initial encounter EXAM: CT HEAD WITHOUT CONTRAST CT CERVICAL SPINE WITHOUT CONTRAST TECHNIQUE: Multidetector CT imaging of the head and cervical spine was performed following the standard protocol without intravenous contrast. Multiplanar CT image reconstructions of the cervical spine were also generated. RADIATION DOSE REDUCTION: This exam was performed according to the departmental dose-optimization program which includes automated exposure control, adjustment of the mA and/or kV according to patient size and/or use of iterative reconstruction technique. COMPARISON:  10/02/2021 FINDINGS: CT HEAD FINDINGS Brain: No evidence of acute infarction, hemorrhage, hydrocephalus, extra-axial collection or mass lesion/mass effect. Mild atrophy is again identified. Basal ganglia calcifications are seen bilaterally. Findings of empty sella are again seen and stable. Vascular: No hyperdense vessel or unexpected calcification. Skull: Normal. Negative for fracture or focal lesion. Sinuses/Orbits: No acute finding. Other: Scalp hematoma is noted in the left posterior parietal region near the vertex consistent with the recent injury. CT CERVICAL SPINE FINDINGS Alignment: Within normal limits. Skull base and  vertebrae: 7 cervical segments are well visualized. Vertebral body height is well maintained. Mild osteophytic changes are seen. Facet hypertrophic changes are noted throughout the cervical  spine. No acute fracture or acute facet abnormality is noted. Soft tissues and spinal canal: Surrounding soft tissue structures are within normal limits. Upper chest: Visualized lung apices are within normal limits. Other: None IMPRESSION: CT of the head: Chronic atrophic changes are again identified. No acute intracranial abnormality noted. New left parietal scalp hematoma near the vertex. CT of the cervical spine: Multilevel degenerative change without acute abnormality. The overall appearance is stable from the prior CT. Electronically Signed   By: Alcide Clever M.D.   On: 10/06/2021 02:05    ____________________________________________   PROCEDURES  Procedure(s) performed:   Procedures  None  ____________________________________________   INITIAL IMPRESSION / ASSESSMENT AND PLAN / ED COURSE  Pertinent labs & imaging results that were available during my care of the patient were reviewed by me and considered in my medical decision making (see chart for details).   This patient is Presenting for Evaluation of fall, which does require a range of treatment options, and is a complaint that involves a high risk of morbidity and mortality.  The Differential Diagnoses includes subdural hematoma, epidural hematoma, acute concussion, traumatic subarachnoid hemorrhage, cerebral contusions, etc.   Critical Interventions-    Medications  cefTRIAXone (ROCEPHIN) 1 g in sodium chloride 0.9 % 100 mL IVPB (has no administration in time range)  escitalopram (LEXAPRO) tablet 10 mg (has no administration in time range)  levothyroxine (SYNTHROID) tablet 75 mcg (has no administration in time range)  pantoprazole (PROTONIX) EC tablet 40 mg (has no administration in time range)  carbidopa-levodopa (SINEMET IR) 25-100 MG per tablet immediate release 1 tablet (has no administration in time range)  entacapone (COMTAN) tablet 200 mg (has no administration in time range)  rasagiline (AZILECT) tablet 1 mg (has  no administration in time range)  sodium chloride flush (NS) 0.9 % injection 3 mL (has no administration in time range)  enoxaparin (LOVENOX) injection 40 mg (has no administration in time range)  acetaminophen (TYLENOL) tablet 650 mg (has no administration in time range)    Or  acetaminophen (TYLENOL) suppository 650 mg (has no administration in time range)  polyethylene glycol (MIRALAX / GLYCOLAX) packet 17 g (has no administration in time range)  0.9 %  sodium chloride infusion (has no administration in time range)  potassium chloride SA (KLOR-CON M) CR tablet 20 mEq (has no administration in time range)  sodium chloride 0.9 % bolus 500 mL (0 mLs Intravenous Stopped 10/06/21 0213)  cefTRIAXone (ROCEPHIN) 1 g in sodium chloride 0.9 % 100 mL IVPB (0 g Intravenous Stopped 10/06/21 0301)    Reassessment after intervention: symptoms improved.    I did obtain Additional Historical Information from sons at bedside.   I decided to review pertinent External Data, and in summary patient last seen by me on 10/02/21 with fall and face trauma at that time.    Clinical Laboratory Tests Ordered, included troponin is normal.  LFTs and bilirubin normal.  Nitrate positive UA with bacteria and plan to treat with Rocephin.  No leukocytosis.  Radiologic Tests Ordered, included CT head and c spine. I independently interpreted the images and agree with radiology interpretation.   Cardiac Monitor Tracing which shows NSR.   Social Determinants of Health Risk patient is a non-smoker.    Consult complete with Hospitalist. Plan for admit. Spoke with Dr. Antionette Char.  Medical Decision  Making: Summary:  Presents emergency department for evaluation after fall x2 with syncope versus near syncope causing fall.  No hypotension here.  No acute ischemic change on EKG or arrhythmia.  Troponin is reassuring.  No traumatic finding on head CT.  Reevaluation with update and discussion with patient and son. Plan for admit.    Disposition: admit  ____________________________________________  FINAL CLINICAL IMPRESSION(S) / ED DIAGNOSES  Final diagnoses:  Syncope, unspecified syncope type  Injury of head, initial encounter  Hematoma of scalp, initial encounter     Note:  This document was prepared using Dragon voice recognition software and may include unintentional dictation errors.  Alona Bene, MD, [facility] Emergency Medicine    Nitish Roes, Arlyss Repress, MD 10/06/21 [phone]

## 2021-10-06 NOTE — Progress Notes (Signed)
  Echocardiogram 2D Echocardiogram has been performed.  Tami Vega 10/06/2021, 9:43 AM

## 2021-10-06 NOTE — Progress Notes (Signed)
PROGRESS NOTE    Tami Vega  ZOX:[id number] DOB: [birth year] DOA: 10/05/2021 PCP: Crist Fat, MD     Brief Narrative:  81 y.o. WF PMHx Hypothyroidism, Parkinson disease, and depression,   Presenting to the emergency department after 2 syncopal episodes in the last 24 hours.  Patient was seen in the emergency department on 10/02/2021 after a slip and fall, but did not believe that she lost consciousness at that time.  Yesterday, she was feeling lightheaded on standing, but then had a syncopal episode after she had been standing for a while and did not feel that she had any warning.  She denies any chest pain or palpitations associated with this.  She later went on to have a second syncopal episode that occurred just after using the commode and was preceded by lightheadedness when she stood up.  She denies any nausea, vomiting, diarrhea, fevers, or chills.  She has had some mild dysuria.   ED Course: Upon arrival to the ED, patient is found to be afebrile and saturating mid 90s on room air with stable blood pressure.  EKG features sinus rhythm and head CT was negative for acute intracranial abnormality.  Cervical spine CT was negative for acute findings.  Chemistry panel notable for an elevated BUN to creatinine ratio and potassium of 3.3.  Urine was nitrite positive.  Patient was given 500 cc of saline and 1 g IV Rocephin in the ED.   Subjective: A/O x4, states has had multiple episodes of syncope/presyncope had a previous episode of syncope a couple of days ago when she bent over to pick up an object from the floor.  States that episode as well as current episode she only had LOC for a few seconds.  Lives at home with husband and son.   Assessment & Plan: Covid vaccination;   Principal Problem:   Syncope Active Problems:   Parkinson's disease (HCC)   Hypothyroidism   UTI (urinary tract infection)   Syncope  - Presents after 2 syncopal episodes, one with no warning and the other  preceded by lightheadedness upon standing  - Check orthostatic vitals, continue cardiac monitoring, check echocardiogram    Acute diastolic CHF -Strict in and out - Daily weight  Orthostatic Hypotension - 7/10 patient extremely orthostatic. - Entacapone can cause orthostatic hypotension.  Will consult neurology given that this is a Parkinson's medication.   UTI - Not septic on admission  - Started on Rocephin in ED  - Culture urine, continue Rocephin for now    Parkinson's disease - Sinemet 25-100 mg 5 times per day - Entacapone 200 mg TID - Continue home regimen   Hypokalemia - Potassium goal> 4 - 7/10 K-Dur 60 mEq       Goals of care - 7/10 PT recommends SNF    Mobility Assessment (last 72 hours)     Mobility Assessment   No documentation.             Interdisciplinary Goals of Care Family Meeting   Date carried out: 10/06/2021  Location of the meeting:   Member's involved:   Durable Power of Insurance risk surveyor:     Discussion: We discussed goals of care for [employer] .    Code status:   Disposition:   Time spent for the meeting:     Zariah Cavendish, Roselind Messier, MD  10/06/2021, 11:02 AM         DVT prophylaxis: Lovenox Code Status: Full Family Communication:  Status  is: Inpatient    Dispo: The patient is from: Home              Anticipated d/c is to: SNF              Anticipated d/c date is: > 3 days              Patient currently is not medically stable to d/c.      Consultants:    Procedures/Significant Events:  7/10 Echocardiogram  Left Ventricle: Left ventricular ejection fraction, by estimation, is 65  to 70%. The left ventricle has normal function. The left ventricle has no  regional wall motion abnormalities. The left ventricular internal cavity  size was normal in size. There is   mild asymmetric left ventricular hypertrophy of the basal-septal segment.  Left ventricular diastolic parameters are  consistent with Grade I  diastolic dysfunction (impaired relaxation).    I have personally reviewed and interpreted all radiology studies and my findings are as above.  VENTILATOR SETTINGS:    Cultures   Antimicrobials: Anti-infectives (From admission, onward)    Start     Dose/Rate Route Frequency Ordered Stop   10/07/21 0200  cefTRIAXone (ROCEPHIN) 1 g in sodium chloride 0.9 % 100 mL IVPB        1 g 200 mL/hr over 30 Minutes Intravenous Every 24 hours 10/06/21 0536     10/06/21 0215  cefTRIAXone (ROCEPHIN) 1 g in sodium chloride 0.9 % 100 mL IVPB        1 g 200 mL/hr over 30 Minutes Intravenous  Once 10/06/21 0212 10/06/21 0301         Devices    LINES / TUBES:      Continuous Infusions:  [START ON 10/07/2021] cefTRIAXone (ROCEPHIN)  IV       Objective: Vitals:   10/06/21 0610 10/06/21 0635 10/06/21 0830 10/06/21 0832  BP:   126/68   Pulse:   72 71  Resp: 15  14   Temp:      TempSrc:      SpO2: 97% 97% 92% 94%  Weight:      Height:        Intake/Output Summary (Last 24 hours) at 10/06/2021 1102 Last data filed at 10/06/2021 0301 Gross per 24 hour  Intake 600 ml  Output --  Net 600 ml   Filed Weights   10/06/21 0406  Weight: 69.4 kg    Examination:  General: A/O x4, No acute respiratory distress Eyes: negative scleral hemorrhage, negative anisocoria, negative icterus ENT: Negative Runny nose, negative gingival bleeding, Neck:  Negative scars, masses, torticollis, lymphadenopathy, JVD Lungs: Clear to auscultation bilaterally without wheezes or crackles Cardiovascular: Regular rate and rhythm without murmur gallop or rub normal S1 and S2 Abdomen: negative abdominal pain, nondistended, positive soft, bowel sounds, no rebound, no ascites, no appreciable mass Extremities: No significant cyanosis, clubbing, or edema bilateral lower extremities Skin: Negative rashes, lesions, ulcers, positive multiple hematoma on chin and right side of mandible C/W  fall Psychiatric:  Negative depression, negative anxiety, negative fatigue, negative mania  Central nervous system:  Cranial nerves II through XII intact, tongue/uvula midline, all extremities muscle strength 5/5, sensation intact throughout, negative dysarthria, negative expressive aphasia, negative receptive aphasia.  .     Data Reviewed: Care during the described time interval was provided by me .  I have reviewed this patient's available data, including medical history, events of note, physical examination, and all test results as part of my  evaluation.  CBC: Recent Labs  Lab 10/02/21 1943 10/05/21 2314 10/06/21 0816  WBC 11.5* 8.3 6.9  NEUTROABS 7.1  --  3.7  HGB 14.0 12.9 13.2  HCT 39.7 37.1 38.0  MCV 86.3 88.8 88.8  PLT 190 182 190   Basic Metabolic Panel: Recent Labs  Lab 10/02/21 1943 10/05/21 2314 10/06/21 0816  NA 140 138 142  K 3.5 3.3* 3.4*  CL 111 107 114*  CO2 20* 24 22  GLUCOSE 103* 108* 94  BUN 25* 37* 25*  CREATININE 0.83 0.78 0.64  CALCIUM 8.9 8.7* 8.0*  MG  --   --  2.1  PHOS  --   --  2.8   GFR: Estimated Creatinine Clearance: 52.7 mL/min (by C-G formula based on SCr of 0.64 mg/dL). Liver Function Tests: Recent Labs  Lab 10/02/21 1943 10/05/21 2314 10/06/21 0816  AST 18 16 14*  ALT 11 7 6   ALKPHOS 43 41 38  BILITOT 1.2 1.5* 1.3*  PROT 6.3* 6.1* 5.9*  ALBUMIN 3.8 3.7 3.7   Recent Labs  Lab 10/02/21 1943 10/05/21 2314  LIPASE 36 33   No results for input(s): "AMMONIA" in the last 168 hours. Coagulation Profile: No results for input(s): "INR", "PROTIME" in the last 168 hours. Cardiac Enzymes: No results for input(s): "CKTOTAL", "CKMB", "CKMBINDEX", "TROPONINI" in the last 168 hours. BNP (last 3 results) No results for input(s): "PROBNP" in the last 8760 hours. HbA1C: No results for input(s): "HGBA1C" in the last 72 hours. CBG: No results for input(s): "GLUCAP" in the last 168 hours. Lipid Profile: No results for input(s):  "CHOL", "HDL", "LDLCALC", "TRIG", "CHOLHDL", "LDLDIRECT" in the last 72 hours. Thyroid Function Tests: No results for input(s): "TSH", "T4TOTAL", "FREET4", "T3FREE", "THYROIDAB" in the last 72 hours. Anemia Panel: No results for input(s): "VITAMINB12", "FOLATE", "FERRITIN", "TIBC", "IRON", "RETICCTPCT" in the last 72 hours. Sepsis Labs: No results for input(s): "PROCALCITON", "LATICACIDVEN" in the last 168 hours.  No results found for this or any previous visit (from the past 240 hour(s)).       Radiology Studies: ECHOCARDIOGRAM COMPLETE  Result Date: 10/06/2021    ECHOCARDIOGRAM REPORT   Patient Name:   VERLIE LIOTTA Date of Exam: 10/06/2021 Medical Rec #:  12/07/2021      Height:       67.0 in Accession #:    [id number]     Weight:       153.0 lb Date of Birth:  1940/05/26      BSA:          1.805 m Patient Age:    81 years       BP:           140/61 mmHg Patient Gender: F              HR:           75 bpm. Exam Location:  Inpatient Procedure: 2D Echo, Cardiac Doppler and Color Doppler Indications:    R55 Syncope  History:        Patient has prior history of Echocardiogram examinations, most                 recent 10/11/2012. Signs/Symptoms:Chest Pain,                 Dizziness/Lightheadedness and Syncope.  Sonographer:    10/13/2012 RDCS Referring Phys: Sheralyn Boatman TIMOTHY S OPYD  Sonographer Comments: Technically difficult study due to poor echo windows. Image acquisition challenging due to respiratory motion.  IMPRESSIONS  1. Left ventricular ejection fraction, by estimation, is 65 to 70%. The left ventricle has normal function. The left ventricle has no regional wall motion abnormalities. There is mild asymmetric left ventricular hypertrophy of the basal-septal segment. Left ventricular diastolic parameters are consistent with Grade I diastolic dysfunction (impaired relaxation).  2. Right ventricular systolic function is normal. The right ventricular size is normal. Tricuspid regurgitation signal is  inadequate for assessing PA pressure.  3. The mitral valve is normal in structure. No evidence of mitral valve regurgitation. No evidence of mitral stenosis.  4. The aortic valve is tricuspid. Aortic valve regurgitation is not visualized. Aortic valve sclerosis is present, with no evidence of aortic valve stenosis. FINDINGS  Left Ventricle: Left ventricular ejection fraction, by estimation, is 65 to 70%. The left ventricle has normal function. The left ventricle has no regional wall motion abnormalities. The left ventricular internal cavity size was normal in size. There is  mild asymmetric left ventricular hypertrophy of the basal-septal segment. Left ventricular diastolic parameters are consistent with Grade I diastolic dysfunction (impaired relaxation). Right Ventricle: The right ventricular size is normal. No increase in right ventricular wall thickness. Right ventricular systolic function is normal. Tricuspid regurgitation signal is inadequate for assessing PA pressure. Left Atrium: Left atrial size was normal in size. Right Atrium: Right atrial size was normal in size. Pericardium: There is no evidence of pericardial effusion. Mitral Valve: The mitral valve is normal in structure. No evidence of mitral valve regurgitation. No evidence of mitral valve stenosis. Tricuspid Valve: The tricuspid valve is normal in structure. Tricuspid valve regurgitation is trivial. Aortic Valve: The aortic valve is tricuspid. Aortic valve regurgitation is not visualized. Aortic valve sclerosis is present, with no evidence of aortic valve stenosis. Pulmonic Valve: The pulmonic valve was not well visualized. Pulmonic valve regurgitation is not visualized. Aorta: The aortic root and ascending aorta are structurally normal, with no evidence of dilitation. IAS/Shunts: The interatrial septum was not well visualized.  LEFT VENTRICLE PLAX 2D LVIDd:         3.70 cm     Diastology LVIDs:         2.30 cm     LV e' medial:    8.27 cm/s LV PW:          0.90 cm     LV E/e' medial:  6.3 LV IVS:        0.90 cm     LV e' lateral:   7.72 cm/s LVOT diam:     2.30 cm     LV E/e' lateral: 6.7 LV SV:         83 LV SV Index:   46 LVOT Area:     4.15 cm  LV Volumes (MOD) LV vol d, MOD A2C: 59.0 ml LV vol d, MOD A4C: 62.3 ml LV vol s, MOD A2C: 19.7 ml LV vol s, MOD A4C: 21.2 ml LV SV MOD A2C:     39.3 ml LV SV MOD A4C:     62.3 ml LV SV MOD BP:      41.4 ml RIGHT VENTRICLE            IVC RV S prime:     8.38 cm/s  IVC diam: 1.40 cm TAPSE (M-mode): 1.6 cm LEFT ATRIUM           Index        RIGHT ATRIUM           Index LA diam:  3.50 cm 1.94 cm/m   RA Area:     11.40 cm LA Vol (A2C): 33.7 ml 18.67 ml/m  RA Volume:   24.10 ml  13.35 ml/m LA Vol (A4C): 21.8 ml 12.08 ml/m  AORTIC VALVE LVOT Vmax:   96.00 cm/s LVOT Vmean:  61.700 cm/s LVOT VTI:    0.199 m  AORTA Ao Root diam: 2.60 cm Ao Asc diam:  3.20 cm MITRAL VALVE MV Area (PHT): 2.93 cm    SHUNTS MV Decel Time: 259 msec    Systemic VTI:  0.20 m MV E velocity: 52.05 cm/s  Systemic Diam: 2.30 cm MV A velocity: 87.20 cm/s MV E/A ratio:  0.60 Epifanio Lesches MD Electronically signed by Epifanio Lesches MD Signature Date/Time: 10/06/2021/10:25:28 AM    Final    CT Head Wo Contrast  Result Date: 10/06/2021 CLINICAL DATA:  Repetitive falls with headaches and neck pain, initial encounter EXAM: CT HEAD WITHOUT CONTRAST CT CERVICAL SPINE WITHOUT CONTRAST TECHNIQUE: Multidetector CT imaging of the head and cervical spine was performed following the standard protocol without intravenous contrast. Multiplanar CT image reconstructions of the cervical spine were also generated. RADIATION DOSE REDUCTION: This exam was performed according to the departmental dose-optimization program which includes automated exposure control, adjustment of the mA and/or kV according to patient size and/or use of iterative reconstruction technique. COMPARISON:  10/02/2021 FINDINGS: CT HEAD FINDINGS Brain: No evidence of acute  infarction, hemorrhage, hydrocephalus, extra-axial collection or mass lesion/mass effect. Mild atrophy is again identified. Basal ganglia calcifications are seen bilaterally. Findings of empty sella are again seen and stable. Vascular: No hyperdense vessel or unexpected calcification. Skull: Normal. Negative for fracture or focal lesion. Sinuses/Orbits: No acute finding. Other: Scalp hematoma is noted in the left posterior parietal region near the vertex consistent with the recent injury. CT CERVICAL SPINE FINDINGS Alignment: Within normal limits. Skull base and vertebrae: 7 cervical segments are well visualized. Vertebral body height is well maintained. Mild osteophytic changes are seen. Facet hypertrophic changes are noted throughout the cervical spine. No acute fracture or acute facet abnormality is noted. Soft tissues and spinal canal: Surrounding soft tissue structures are within normal limits. Upper chest: Visualized lung apices are within normal limits. Other: None IMPRESSION: CT of the head: Chronic atrophic changes are again identified. No acute intracranial abnormality noted. New left parietal scalp hematoma near the vertex. CT of the cervical spine: Multilevel degenerative change without acute abnormality. The overall appearance is stable from the prior CT. Electronically Signed   By: Alcide Clever M.D.   On: 10/06/2021 02:05   CT Cervical Spine Wo Contrast  Result Date: 10/06/2021 CLINICAL DATA:  Repetitive falls with headaches and neck pain, initial encounter EXAM: CT HEAD WITHOUT CONTRAST CT CERVICAL SPINE WITHOUT CONTRAST TECHNIQUE: Multidetector CT imaging of the head and cervical spine was performed following the standard protocol without intravenous contrast. Multiplanar CT image reconstructions of the cervical spine were also generated. RADIATION DOSE REDUCTION: This exam was performed according to the departmental dose-optimization program which includes automated exposure control, adjustment  of the mA and/or kV according to patient size and/or use of iterative reconstruction technique. COMPARISON:  10/02/2021 FINDINGS: CT HEAD FINDINGS Brain: No evidence of acute infarction, hemorrhage, hydrocephalus, extra-axial collection or mass lesion/mass effect. Mild atrophy is again identified. Basal ganglia calcifications are seen bilaterally. Findings of empty sella are again seen and stable. Vascular: No hyperdense vessel or unexpected calcification. Skull: Normal. Negative for fracture or focal lesion. Sinuses/Orbits: No acute finding. Other: Scalp hematoma  is noted in the left posterior parietal region near the vertex consistent with the recent injury. CT CERVICAL SPINE FINDINGS Alignment: Within normal limits. Skull base and vertebrae: 7 cervical segments are well visualized. Vertebral body height is well maintained. Mild osteophytic changes are seen. Facet hypertrophic changes are noted throughout the cervical spine. No acute fracture or acute facet abnormality is noted. Soft tissues and spinal canal: Surrounding soft tissue structures are within normal limits. Upper chest: Visualized lung apices are within normal limits. Other: None IMPRESSION: CT of the head: Chronic atrophic changes are again identified. No acute intracranial abnormality noted. New left parietal scalp hematoma near the vertex. CT of the cervical spine: Multilevel degenerative change without acute abnormality. The overall appearance is stable from the prior CT. Electronically Signed   By: Alcide Clever M.D.   On: 10/06/2021 02:05        Scheduled Meds:  carbidopa-levodopa  1 tablet Oral 5 X Daily   enoxaparin (LOVENOX) injection  40 mg Subcutaneous Q24H   entacapone  200 mg Oral TID   escitalopram  10 mg Oral BID   levothyroxine  75 mcg Oral q morning   pantoprazole  40 mg Oral Daily   rasagiline  1 mg Oral Daily   sodium chloride flush  3 mL Intravenous Q12H   Continuous Infusions:  [START ON 10/07/2021] cefTRIAXone  (ROCEPHIN)  IV       LOS: 0 days    Time spent:40 min    Gilbert Narain, Roselind Messier, MD [facility]   If 7PM-7AM, please contact night-coverage 10/06/2021, 11:02 AM

## 2021-10-06 NOTE — [place] FL2 (Signed)
[place] MEDICAID FL2 LEVEL OF CARE SCREENING TOOL     IDENTIFICATION  Patient Name: Tami Vega Birthdate: [birth year] Sex: female Admission Date (Current Location): 10/05/2021  [facility] and [place] Number:  Producer, television/film/video and Address:  [facility],  [address] [place], [place] [address]      Provider Number: [id number]  Attending Physician Name and Address:  Drema Dallas, MD  Relative Name and Phone Number:  Kaijah, Abts [phone]    Current Level of Care: Hospital Recommended Level of Care: Skilled Nursing Facility Prior Approval Number:    Date Approved/Denied:   PASRR Number: [id number]  Discharge Plan: SNF    Current Diagnoses: Patient Active Problem List   Diagnosis Date Noted   Syncope 10/06/2021   UTI (urinary tract infection) 10/06/2021   Dizziness 11/02/2019   Parkinson's disease (HCC) 11/02/2019   Proximal humerus fracture 11/02/2019   Hypothyroidism 11/02/2019   Chest discomfort 12/09/2012    Orientation RESPIRATION BLADDER Height & Weight     Self, Time, Situation, Place  Normal Incontinent Weight: 153 lb (69.4 kg) Height:  5\' 7"  (170.2 cm)  BEHAVIORAL SYMPTOMS/MOOD NEUROLOGICAL BOWEL NUTRITION STATUS      Continent Diet (Normal)  AMBULATORY STATUS COMMUNICATION OF NEEDS Skin   Limited Assist Verbally Normal                       Personal Care Assistance Level of Assistance  Bathing, Feeding, Dressing Bathing Assistance: Independent Feeding assistance: Independent Dressing Assistance: Independent     Functional Limitations Info  Sight, Speech, Hearing Sight Info: Adequate Hearing Info: Adequate Speech Info: Adequate    SPECIAL CARE FACTORS FREQUENCY  PT (By licensed PT)     PT Frequency: 5x/wk              Contractures Contractures Info: Not present    Additional Factors Info  Code Status, Allergies, Psychotropic Code Status Info: FULL Allergies Info: No Known Allergies Psychotropic  Info: See MAR         Current Medications (10/06/2021):  This is the current hospital active medication list Current Facility-Administered Medications  Medication Dose Route Frequency Provider Last Rate Last Admin   acetaminophen (TYLENOL) tablet 650 mg  650 mg Oral Q6H PRN Opyd, 12/07/2021, MD       Or   acetaminophen (TYLENOL) suppository 650 mg  650 mg Rectal Q6H PRN Opyd, Lavone Neri, MD       carbidopa-levodopa (SINEMET IR) 25-100 MG per tablet immediate release 1 tablet  1 tablet Oral 5 X Daily Opyd, Lavone Neri, MD   1 tablet at 10/06/21 1253   [START ON 10/07/2021] cefTRIAXone (ROCEPHIN) 1 g in sodium chloride 0.9 % 100 mL IVPB  1 g Intravenous Q24H Opyd, 12/08/2021, MD       enoxaparin (LOVENOX) injection 40 mg  40 mg Subcutaneous Q24H Opyd, Lavone Neri, MD   40 mg at 10/06/21 1000   entacapone (COMTAN) tablet 200 mg  200 mg Oral TID 12/07/21, MD   200 mg at 10/06/21 1253   escitalopram (LEXAPRO) tablet 10 mg  10 mg Oral BID Opyd, 12/07/21, MD   10 mg at 10/06/21 0959   levothyroxine (SYNTHROID) tablet 75 mcg  75 mcg Oral q morning Opyd, 12/07/21, MD   75 mcg at 10/06/21 0608   pantoprazole (PROTONIX) EC tablet 40 mg  40 mg Oral Daily Opyd, 12/07/21, MD   40 mg at 10/06/21  0867   polyethylene glycol (MIRALAX / GLYCOLAX) packet 17 g  17 g Oral Daily PRN Opyd, Lavone Neri, MD       potassium chloride (KLOR-CON M) CR tablet 30 mEq  30 mEq Oral Once Drema Dallas, MD       rasagiline (AZILECT) tablet 1 mg  1 mg Oral Daily Opyd, Lavone Neri, MD   1 mg at 10/06/21 0959   sodium chloride flush (NS) 0.9 % injection 3 mL  3 mL Intravenous Q12H Opyd, Lavone Neri, MD   3 mL at 10/06/21 1001     Discharge Medications: Please see discharge summary for a list of discharge medications.  Relevant Imaging Results:  Relevant Lab Results:   Additional Information [phone] LCSW

## 2021-10-06 NOTE — TOC Initial Note (Signed)
Transition of Care [facility]) - Initial/Assessment Note    Patient Details  Name: Tami Vega MRN: [id number] Date of Birth: 1940/12/08  Transition of Care [facility]) CM/SW Contact:    Vassie Moselle, LCSW Phone Number: 10/06/2021, 3:56 PM  Clinical Narrative:                 Met with pt and confirmed plans for SNF placement. Pt is agreeable to SNF placement and gave verbal permission for CSW to speak with son to discuss placement. Pt states she has not been to SNF in the past and has no facility preference at this time. CSW faxed pt out for SNF placement and currently awaiting bed offers.   Expected Discharge Plan: Skilled Nursing Facility Barriers to Discharge: Continued Medical Work up   Patient Goals and CMS Choice Patient states their goals for this hospitalization and ongoing recovery are:: To return home   Choice offered to / list presented to : Patient, Adult Children  Expected Discharge Plan and Services Expected Discharge Plan: [place] In-house Referral: Clinical Social Work Discharge Planning Services: CM Consult Post Acute Care Choice: [place] Living arrangements for the past 2 months: Single Family Home                 DME Arranged: N/A DME Agency: NA                  Prior Living Arrangements/Services Living arrangements for the past 2 months: Single Family Home Lives with:: Adult Children Patient language and need for interpreter reviewed:: Yes Do you feel safe going back to the place where you live?: Yes      Need for Family Participation in Patient Care: No (Comment) Care giver support system in place?: Yes (comment) Current home services: DME Criminal Activity/Legal Involvement Pertinent to Current Situation/Hospitalization: No - Comment as needed  Activities of Daily Living Home Assistive Devices/Equipment: None ADL Screening (condition at time of admission) Patient's cognitive ability adequate to safely complete  daily activities?: Yes Is the patient deaf or have difficulty hearing?: Yes Does the patient have difficulty seeing, even when wearing glasses/contacts?: Yes Does the patient have difficulty concentrating, remembering, or making decisions?: No Patient able to express need for assistance with ADLs?: Yes Does the patient have difficulty dressing or bathing?: Yes Independently performs ADLs?: No Communication: Needs assistance Is this a change from baseline?: Change from baseline, expected to last <3 days Dressing (OT): Needs assistance Is this a change from baseline?: Change from baseline, expected to last <3days Grooming: Needs assistance Is this a change from baseline?: Change from baseline, expected to last <3 days Feeding: Needs assistance Is this a change from baseline?: Change from baseline, expected to last <3 days Bathing: Needs assistance Is this a change from baseline?: Change from baseline, expected to last <3 days Toileting: Needs assistance Is this a change from baseline?: Change from baseline, expected to last <3 days In/Out Bed: Needs assistance Is this a change from baseline?: Change from baseline, expected to last <3 days Walks in Home: Needs assistance Is this a change from baseline?: Change from baseline, expected to last <3 days Does the patient have difficulty walking or climbing stairs?: Yes Weakness of Legs: Both Weakness of Arms/Hands: Both  Permission Sought/Granted Permission sought to share information with : Facility Sport and exercise psychologist, Family Supports Permission granted to share information with : Yes, Verbal Permission Granted  Share Information with NAME: Tami Vega     Permission granted to  share info w Relationship: Son  Permission granted to share info w Contact Information: [phone]  Emotional Assessment Appearance:: Appears stated age Attitude/Demeanor/Rapport: Engaged, Charismatic Affect (typically observed): Accepting Orientation:  : Oriented to Self, Oriented to Place, Oriented to  Time, Oriented to Situation Alcohol / Substance Use: Not Applicable Psych Involvement: No (comment)  Admission diagnosis:  Syncope [R55] Hematoma of scalp, initial encounter [S00.03XA] Injury of head, initial encounter [S09.90XA] Syncope, unspecified syncope type [R55] Patient Active Problem List   Diagnosis Date Noted   Syncope 10/06/2021   UTI (urinary tract infection) 10/06/2021   Dizziness 11/02/2019   Parkinson's disease ([place]) 11/02/2019   Proximal humerus fracture 11/02/2019   Hypothyroidism 11/02/2019   Chest discomfort 12/09/2012   PCP:  Townsend Roger, MD Pharmacy:   [id number] [place], [place] - [address] [facility] [place] [address]Phone: [phone]: [phone]    Social Determinants of Health (SDOH) Interventions    Readmission Risk Interventions     No data to display

## 2021-10-06 NOTE — H&P (Signed)
History and Physical    Tami Vega ZJI:[id number] DOB: [birth year] DOA: 10/05/2021  PCP: Crist Fat, MD   Patient coming from: Home   Chief Complaint: Syncope   HPI: Tami Vega is a pleasant 81 y.o. female with medical history significant for hypothyroidism, Parkinson disease, and depression, now presenting to the emergency department after 2 syncopal episodes in the last 24 hours.  Patient was seen in the emergency department on 10/02/2021 after a slip and fall, but did not believe that she lost consciousness at that time.  Yesterday, she was feeling lightheaded on standing, but then had a syncopal episode after she had been standing for a while and did not feel that she had any warning.  She denies any chest pain or palpitations associated with this.  She later went on to have a second syncopal episode that occurred just after using the commode and was preceded by lightheadedness when she stood up.  She denies any nausea, vomiting, diarrhea, fevers, or chills.  She has had some mild dysuria.  ED Course: Upon arrival to the ED, patient is found to be afebrile and saturating mid 90s on room air with stable blood pressure.  EKG features sinus rhythm and head CT was negative for acute intracranial abnormality.  Cervical spine CT was negative for acute findings.  Chemistry panel notable for an elevated BUN to creatinine ratio and potassium of 3.3.  Urine was nitrite positive.  Patient was given 500 cc of saline and 1 g IV Rocephin in the ED.  Review of Systems:  All other systems reviewed and apart from HPI, are negative.  Past Medical History:  Diagnosis Date   Abnormal involuntary movements(781.0)    Disorder of bone and cartilage, unspecified    Elevated blood pressure reading without diagnosis of hypertension    Major depressive disorder, single episode, unspecified    Other musculoskeletal symptoms referable to limbs(729.89)    Pain in joint, lower leg    Pure hypercholesterolemia      Past Surgical History:  Procedure Laterality Date   BREAST SURGERY  1980's   benign cyst   COLONOSCOPY  1995, 1998   VAGINAL HYSTERECTOMY  1975    Social History:   reports that she has never smoked. She has never used smokeless tobacco. She reports that she does not drink alcohol and does not use drugs.  No Known Allergies  Family History  Problem Relation Age of Onset   CAD Mother    Breast cancer Mother    CAD Father    Parkinson's disease Father    Tremor Sister    Hyperlipidemia Sister    Hypertension Sister    CAD Brother    Breast cancer Maternal Aunt    Colon cancer Maternal Aunt      Prior to Admission medications   Medication Sig Start Date End Date Taking? Authorizing Provider  b complex vitamins tablet Take 1 tablet by mouth daily.   Yes [provider]  Calcium Carbonate-Vit D-Min (CALCIUM 1200 PO) Take 1 tablet by mouth daily. Take one tablet a day   Yes [provider]  carbidopa-levodopa (SINEMET IR) 25-100 MG tablet Take 1 tablet by mouth 5 (five) times daily. Take at  7 AM, 10 AM, 1 PM, 4 PM and  7 PM 10/14/20  Yes Lomax, Amy, NP  cholecalciferol (VITAMIN D) 25 MCG tablet Take 1 tablet (1,000 Units total) by mouth daily. 11/05/19  Yes Amin, Loura Halt, MD  D-Mannose 500 MG CAPS  Take 500 mg by mouth 3 (three) times daily.   Yes [provider]  entacapone (COMTAN) 200 MG tablet Take 1 tablet (200 mg total) by mouth 3 (three) times daily. Take with Sinemet dose at 7 AM, 1 PM and 7 PM. 10/14/20  Yes Lomax, Amy, NP  escitalopram (LEXAPRO) 10 MG tablet Take 10 mg by mouth 2 (two) times daily. 01/31/17  Yes [provider]  fish oil-omega-3 fatty acids 1000 MG capsule Take 1 g by mouth daily.   Yes [provider]  levothyroxine (SYNTHROID) 75 MCG tablet Take 75 mcg by mouth every morning. 09/16/21  Yes [provider]  Multiple Vitamin (MULTIVITAMIN WITH MINERALS) TABS tablet Take 1 tablet by mouth daily.    Yes [provider]  OMEPRAZOLE PO Take 20 mg by mouth daily.   Yes [provider]  polyethylene glycol (MIRALAX / GLYCOLAX) 17 g packet Take 17 g by mouth daily as needed for mild constipation.   Yes [provider]  Probiotic Product (PROBIOTIC PO) Take 1 capsule by mouth daily.   Yes [provider]  rasagiline (AZILECT) 1 MG TABS tablet Take 1 tablet (1 mg total) by mouth daily. 10/14/20  Yes Lomax, Amy, NP  carbidopa-levodopa (PARCOPA) 25-100 MG disintegrating tablet Take 1/2 tablet up to twice daily as needed. Patient not taking: Reported on 10/06/2021 08/20/21   Shawnie Dapper, NP    Physical Exam: Vitals:   10/06/21 0230 10/06/21 0300 10/06/21 0330 10/06/21 0406  BP: (!) 143/67 (!) 144/69 129/70   Pulse: 68 69 66   Resp: (!) 23 19 18    Temp:      TempSrc:      SpO2: 95% 96% 94%   Weight:    69.4 kg  Height:    5\' 7"  (1.702 m)    Constitutional: NAD, calm  Eyes: PERTLA, lids and conjunctivae normal ENMT: Mucous membranes are moist. Posterior pharynx clear of any exudate or lesions.   Neck: supple, no masses  Respiratory:  no wheezing, no crackles. No accessory muscle use.  Cardiovascular: S1 & S2 heard, regular rate and rhythm. No extremity edema. No significant JVD. Abdomen: No distension, no tenderness, soft. Bowel sounds active.  Musculoskeletal: no clubbing / cyanosis. No joint deformity upper and lower extremities.   Skin: Ecchymosis on chin. Warm, dry, well-perfused. Neurologic: CN 2-12 grossly intact. Moving all extremities. Alert and oriented. Resting tremor.  Psychiatric: Pleasant. Cooperative.    Labs and Imaging on Admission: I have personally reviewed following labs and imaging studies  CBC: Recent Labs  Lab 10/02/21 1943 10/05/21 2314  WBC 11.5* 8.3  NEUTROABS 7.1  --   HGB 14.0 12.9  HCT 39.7 37.1  MCV 86.3 88.8  PLT 190 182   Basic Metabolic Panel: Recent Labs  Lab 10/02/21 1943 10/05/21 2314  NA 140 138  K 3.5  3.3*  CL 111 107  CO2 20* 24  GLUCOSE 103* 108*  BUN 25* 37*  CREATININE 0.83 0.78  CALCIUM 8.9 8.7*   GFR: Estimated Creatinine Clearance: 52.7 mL/min (by C-G formula based on SCr of 0.78 mg/dL). Liver Function Tests: Recent Labs  Lab 10/02/21 1943 10/05/21 2314  AST 18 16  ALT 11 7  ALKPHOS 43 41  BILITOT 1.2 1.5*  PROT 6.3* 6.1*  ALBUMIN 3.8 3.7   Recent Labs  Lab 10/02/21 1943 10/05/21 2314  LIPASE 36 33   No results for input(s): "AMMONIA" in the last 168 hours. Coagulation Profile: No results for  input(s): "INR", "PROTIME" in the last 168 hours. Cardiac Enzymes: No results for input(s): "CKTOTAL", "CKMB", "CKMBINDEX", "TROPONINI" in the last 168 hours. BNP (last 3 results) No results for input(s): "PROBNP" in the last 8760 hours. HbA1C: No results for input(s): "HGBA1C" in the last 72 hours. CBG: No results for input(s): "GLUCAP" in the last 168 hours. Lipid Profile: No results for input(s): "CHOL", "HDL", "LDLCALC", "TRIG", "CHOLHDL", "LDLDIRECT" in the last 72 hours. Thyroid Function Tests: No results for input(s): "TSH", "T4TOTAL", "FREET4", "T3FREE", "THYROIDAB" in the last 72 hours. Anemia Panel: No results for input(s): "VITAMINB12", "FOLATE", "FERRITIN", "TIBC", "IRON", "RETICCTPCT" in the last 72 hours. Urine analysis:    Component Value Date/Time   COLORURINE YELLOW 10/06/2021 0117   APPEARANCEUR CLEAR 10/06/2021 0117   LABSPEC 1.006 10/06/2021 0117   PHURINE 6.0 10/06/2021 0117   GLUCOSEU NEGATIVE 10/06/2021 0117   HGBUR NEGATIVE 10/06/2021 0117   BILIRUBINUR NEGATIVE 10/06/2021 0117   KETONESUR 5 (A) 10/06/2021 0117   PROTEINUR NEGATIVE 10/06/2021 0117   NITRITE POSITIVE (A) 10/06/2021 0117   LEUKOCYTESUR SMALL (A) 10/06/2021 0117   Sepsis Labs: @LABRCNTIP (procalcitonin:4,lacticidven:4) )No results found for this or any previous visit (from the past 240 hour(s)).   Radiological Exams on Admission: CT Head Wo Contrast  Result Date:  10/06/2021 CLINICAL DATA:  Repetitive falls with headaches and neck pain, initial encounter EXAM: CT HEAD WITHOUT CONTRAST CT CERVICAL SPINE WITHOUT CONTRAST TECHNIQUE: Multidetector CT imaging of the head and cervical spine was performed following the standard protocol without intravenous contrast. Multiplanar CT image reconstructions of the cervical spine were also generated. RADIATION DOSE REDUCTION: This exam was performed according to the departmental dose-optimization program which includes automated exposure control, adjustment of the mA and/or kV according to patient size and/or use of iterative reconstruction technique. COMPARISON:  10/02/2021 FINDINGS: CT HEAD FINDINGS Brain: No evidence of acute infarction, hemorrhage, hydrocephalus, extra-axial collection or mass lesion/mass effect. Mild atrophy is again identified. Basal ganglia calcifications are seen bilaterally. Findings of empty sella are again seen and stable. Vascular: No hyperdense vessel or unexpected calcification. Skull: Normal. Negative for fracture or focal lesion. Sinuses/Orbits: No acute finding. Other: Scalp hematoma is noted in the left posterior parietal region near the vertex consistent with the recent injury. CT CERVICAL SPINE FINDINGS Alignment: Within normal limits. Skull base and vertebrae: 7 cervical segments are well visualized. Vertebral body height is well maintained. Mild osteophytic changes are seen. Facet hypertrophic changes are noted throughout the cervical spine. No acute fracture or acute facet abnormality is noted. Soft tissues and spinal canal: Surrounding soft tissue structures are within normal limits. Upper chest: Visualized lung apices are within normal limits. Other: None IMPRESSION: CT of the head: Chronic atrophic changes are again identified. No acute intracranial abnormality noted. New left parietal scalp hematoma near the vertex. CT of the cervical spine: Multilevel degenerative change without acute  abnormality. The overall appearance is stable from the prior CT. Electronically Signed   By: Inez Catalina M.D.   On: 10/06/2021 02:05   CT Cervical Spine Wo Contrast  Result Date: 10/06/2021 CLINICAL DATA:  Repetitive falls with headaches and neck pain, initial encounter EXAM: CT HEAD WITHOUT CONTRAST CT CERVICAL SPINE WITHOUT CONTRAST TECHNIQUE: Multidetector CT imaging of the head and cervical spine was performed following the standard protocol without intravenous contrast. Multiplanar CT image reconstructions of the cervical spine were also generated. RADIATION DOSE REDUCTION: This exam was performed according to the departmental dose-optimization program which includes automated exposure control, adjustment of the  mA and/or kV according to patient size and/or use of iterative reconstruction technique. COMPARISON:  10/02/2021 FINDINGS: CT HEAD FINDINGS Brain: No evidence of acute infarction, hemorrhage, hydrocephalus, extra-axial collection or mass lesion/mass effect. Mild atrophy is again identified. Basal ganglia calcifications are seen bilaterally. Findings of empty sella are again seen and stable. Vascular: No hyperdense vessel or unexpected calcification. Skull: Normal. Negative for fracture or focal lesion. Sinuses/Orbits: No acute finding. Other: Scalp hematoma is noted in the left posterior parietal region near the vertex consistent with the recent injury. CT CERVICAL SPINE FINDINGS Alignment: Within normal limits. Skull base and vertebrae: 7 cervical segments are well visualized. Vertebral body height is well maintained. Mild osteophytic changes are seen. Facet hypertrophic changes are noted throughout the cervical spine. No acute fracture or acute facet abnormality is noted. Soft tissues and spinal canal: Surrounding soft tissue structures are within normal limits. Upper chest: Visualized lung apices are within normal limits. Other: None IMPRESSION: CT of the head: Chronic atrophic changes are again  identified. No acute intracranial abnormality noted. New left parietal scalp hematoma near the vertex. CT of the cervical spine: Multilevel degenerative change without acute abnormality. The overall appearance is stable from the prior CT. Electronically Signed   By: Inez Catalina M.D.   On: 10/06/2021 02:05    EKG: Independently reviewed. Sinus rhythm.   Assessment/Plan   1. Syncope  - Presents after 2 syncopal episodes, one with no warning and the other preceded by lightheadedness upon standing  - Check orthostatic vitals, continue cardiac monitoring, check echocardiogram    2. UTI  - Not septic on admission  - Started on Rocephin in ED  - Culture urine, continue Rocephin for now   3. Parkinson's disease  - Continue home regimen    DVT prophylaxis: Lovenox  Code Status: Full, discussed with patient on admission  Level of Care: Level of care: Telemetry Family Communication: none present  Disposition Plan:  Patient is from: Home  Anticipated d/c is to: TBD Anticipated d/c date is: 10/07/21  Patient currently: pending cardiac monitoring, orthostatic vitals, echo, PT eval  Consults called: none  Admission status: Observation     Vianne Bulls, MD [facility]  10/06/2021, 5:36 AM

## 2021-10-06 NOTE — ED Notes (Addendum)
Unable to do orthostatic vitals at this time. Pt states that she is unable to stand because she has "low energy" right now.

## 2021-10-06 NOTE — Evaluation (Signed)
Physical Therapy Evaluation Patient Details Name: Tami Vega MRN: [id number] DOB: 1941/01/25 Today's Date: 10/06/2021  History of Present Illness  Deatrice Spanbauer is a 81 y.o. female presents for evaluation after fall at home; of note, pt seen in the ED on 7/6 with fall, had a mouth laceration which was repaired and discharged home. Head CT and cervical CT negative for acute findings. PMH: parkinson's disease  Clinical Impression  Pt admitted with above diagnosis. Pt from home, using RW/rollator at baseline, able to perform self care tasks and amb with device while son completes household chores. Pt currently reporting "low energy" and declines to sit EOB or stand with RW and therapist assisting. Pt able to roll R/L with mod A, cued for hand placement and LE assisting to rotate trunk. Pt performs supine LE mobility completing ankle pumps, heel slides, SLR and hip abd/add, denies numbness/tingling or pain. Pt reports poor rest and hasn't eaten breakfast causing low energy. No family present during eval, pt adamant son can assist her at home but unable to confirm with family. Pt currently with functional limitations due to the deficits listed below (see PT Problem List). Pt will benefit from skilled PT to increase their independence and safety with mobility to allow discharge to the venue listed below.          Recommendations for follow up therapy are one component of a multi-disciplinary discharge planning process, led by the attending physician.  Recommendations may be updated based on patient status, additional functional criteria and insurance authorization.  Follow Up Recommendations Skilled nursing-short term rehab (<3 hours/day) Can patient physically be transported by private vehicle: No    Assistance Recommended at Discharge Frequent or constant Supervision/Assistance  Patient can return home with the following  A lot of help with walking and/or transfers;A lot of help with  bathing/dressing/bathroom;Assistance with cooking/housework;Assist for transportation;Help with stairs or ramp for entrance    Equipment Recommendations None recommended by PT  Recommendations for Other Services       Functional Status Assessment Patient has had a recent decline in their functional status and demonstrates the ability to make significant improvements in function in a reasonable and predictable amount of time.     Precautions / Restrictions Precautions Precautions: Fall Precaution Comments: monitor BP Restrictions Weight Bearing Restrictions: No      Mobility  Bed Mobility Overal bed mobility: Needs Assistance Bed Mobility: Rolling Rolling: Mod assist  General bed mobility comments: mod A to roll R/L, cued to use bedrails, able to bridge hips up to reposition self in bed, increased time; pt declines sitting up due to "low energy"    Transfers     Ambulation/Gait     Stairs            Wheelchair Mobility    Modified Rankin (Stroke Patients Only)       Balance Overall balance assessment: History of Falls            Pertinent Vitals/Pain Pain Assessment Pain Assessment: No/denies pain    Home Living Family/patient expects to be discharged to:: Private residence Living Arrangements: Spouse/significant other;Children Available Help at Discharge: Family;Available 24 hours/day Type of Home: House Home Access: Ramped entrance       Home Layout: One level Home Equipment: Pharmacist, hospital (2 wheels);Cane - single point;Rollator (4 wheels)      Prior Function Prior Level of Function : Independent/Modified Independent  Mobility Comments: pt reports using rollator/RW in the home and community, denies falls prior to  this hospitalization ADLs Comments: pt reports ind with self care, son does cooking and house cleaning     Hand Dominance   Dominant Hand: Right    Extremity/Trunk Assessment   Upper Extremity Assessment Upper  Extremity Assessment: Generalized weakness    Lower Extremity Assessment Lower Extremity Assessment: Generalized weakness;LLE deficits/detail;RLE deficits/detail RLE Deficits / Details: AROM WNL, able to perform hip abd/add, SLR, heel slides and ankle pumps symmetrically, strength grossly 3+/5, denies numbness/tingling throughout RLE Sensation: WNL RLE Coordination: WNL LLE Deficits / Details: AROM WNL, able to perform hip abd/add, SLR, heel slides and ankle pumps symmetrically, strength grossly 3+/5, denies numbness/tingling throughout LLE Sensation: WNL LLE Coordination: WNL       Communication   Communication: HOH  Cognition Arousal/Alertness: Awake/alert Behavior During Therapy: Flat affect Overall Cognitive Status: No family/caregiver present to determine baseline cognitive functioning  General Comments: pt flat, able to state name, date, location and situation appropriately. pt flat and c/o "low energy" due to lack of sleep and hasn't eaten, no family present during eval        General Comments      Exercises     Assessment/Plan    PT Assessment Patient needs continued PT services  PT Problem List Decreased strength;Decreased activity tolerance;Decreased balance;Decreased mobility;Decreased cognition;Decreased knowledge of use of DME       PT Treatment Interventions DME instruction;Gait training;Functional mobility training;Therapeutic activities;Therapeutic exercise;Neuromuscular re-education;Patient/family education    PT Goals (Current goals can be found in the Care Plan section)  Acute Rehab PT Goals Patient Stated Goal: home with son to assist PT Goal Formulation: With patient Time For Goal Achievement: 10/20/21 Potential to Achieve Goals: Good    Frequency Min 2X/week     Co-evaluation               AM-PAC PT "6 Clicks" Mobility  Outcome Measure Help needed turning from your back to your side while in a flat bed without using bedrails?: A  Lot Help needed moving from lying on your back to sitting on the side of a flat bed without using bedrails?: A Lot Help needed moving to and from a bed to a chair (including a wheelchair)?: Total Help needed standing up from a chair using your arms (e.g., wheelchair or bedside chair)?: Total Help needed to walk in hospital room?: Total Help needed climbing 3-5 steps with a railing? : Total 6 Click Score: 8    End of Session   Activity Tolerance: Patient limited by fatigue Patient left: in bed;with call bell/phone within reach Nurse Communication: Mobility status;Other (comment) (low energy, personal medication, wanting to eat breakfast) PT Visit Diagnosis: Unsteadiness on feet (R26.81);Other abnormalities of gait and mobility (R26.89);Muscle weakness (generalized) (M62.81);History of falling (Z91.81)    Time: 0300-9233 PT Time Calculation (min) (ACUTE ONLY): 21 min   Charges:   PT Evaluation $PT Eval Moderate Complexity: 1 Mod           Tori Visente Kirker PT, DPT 10/06/21, 11:21 AM

## 2021-10-07 ENCOUNTER — Inpatient Hospital Stay (HOSPITAL_COMMUNITY): Payer: Medicare HMO

## 2021-10-07 ENCOUNTER — Observation Stay (HOSPITAL_COMMUNITY): Payer: Medicare HMO

## 2021-10-07 DIAGNOSIS — S0003XA Contusion of scalp, initial encounter: Secondary | ICD-10-CM | POA: Diagnosis not present

## 2021-10-07 DIAGNOSIS — Z820 Family history of epilepsy and other diseases of the nervous system: Secondary | ICD-10-CM | POA: Diagnosis not present

## 2021-10-07 DIAGNOSIS — S01512D Laceration without foreign body of oral cavity, subsequent encounter: Secondary | ICD-10-CM | POA: Diagnosis not present

## 2021-10-07 DIAGNOSIS — F32A Depression, unspecified: Secondary | ICD-10-CM | POA: Diagnosis present

## 2021-10-07 DIAGNOSIS — Z9181 History of falling: Secondary | ICD-10-CM | POA: Diagnosis not present

## 2021-10-07 DIAGNOSIS — Z79890 Hormone replacement therapy (postmenopausal): Secondary | ICD-10-CM | POA: Diagnosis not present

## 2021-10-07 DIAGNOSIS — Z803 Family history of malignant neoplasm of breast: Secondary | ICD-10-CM | POA: Diagnosis not present

## 2021-10-07 DIAGNOSIS — E038 Other specified hypothyroidism: Secondary | ICD-10-CM | POA: Diagnosis not present

## 2021-10-07 DIAGNOSIS — E876 Hypokalemia: Secondary | ICD-10-CM | POA: Diagnosis not present

## 2021-10-07 DIAGNOSIS — Z8249 Family history of ischemic heart disease and other diseases of the circulatory system: Secondary | ICD-10-CM | POA: Diagnosis not present

## 2021-10-07 DIAGNOSIS — W1830XD Fall on same level, unspecified, subsequent encounter: Secondary | ICD-10-CM | POA: Diagnosis not present

## 2021-10-07 DIAGNOSIS — R69 Illness, unspecified: Secondary | ICD-10-CM | POA: Diagnosis not present

## 2021-10-07 DIAGNOSIS — G20 Parkinson's disease: Secondary | ICD-10-CM | POA: Diagnosis not present

## 2021-10-07 DIAGNOSIS — E039 Hypothyroidism, unspecified: Secondary | ICD-10-CM | POA: Diagnosis not present

## 2021-10-07 DIAGNOSIS — I5032 Chronic diastolic (congestive) heart failure: Secondary | ICD-10-CM | POA: Diagnosis not present

## 2021-10-07 DIAGNOSIS — Z800 Family history of malignant neoplasm of digestive organs: Secondary | ICD-10-CM | POA: Diagnosis not present

## 2021-10-07 DIAGNOSIS — N390 Urinary tract infection, site not specified: Secondary | ICD-10-CM | POA: Diagnosis not present

## 2021-10-07 DIAGNOSIS — K56609 Unspecified intestinal obstruction, unspecified as to partial versus complete obstruction: Secondary | ICD-10-CM | POA: Diagnosis not present

## 2021-10-07 DIAGNOSIS — B9620 Unspecified Escherichia coli [E. coli] as the cause of diseases classified elsewhere: Secondary | ICD-10-CM | POA: Diagnosis not present

## 2021-10-07 DIAGNOSIS — R55 Syncope and collapse: Secondary | ICD-10-CM | POA: Diagnosis not present

## 2021-10-07 DIAGNOSIS — Z79899 Other long term (current) drug therapy: Secondary | ICD-10-CM | POA: Diagnosis not present

## 2021-10-07 DIAGNOSIS — E7800 Pure hypercholesterolemia, unspecified: Secondary | ICD-10-CM | POA: Diagnosis not present

## 2021-10-07 DIAGNOSIS — Z83438 Family history of other disorder of lipoprotein metabolism and other lipidemia: Secondary | ICD-10-CM | POA: Diagnosis not present

## 2021-10-07 DIAGNOSIS — W1830XA Fall on same level, unspecified, initial encounter: Secondary | ICD-10-CM | POA: Diagnosis not present

## 2021-10-07 DIAGNOSIS — E860 Dehydration: Secondary | ICD-10-CM | POA: Diagnosis not present

## 2021-10-07 DIAGNOSIS — I951 Orthostatic hypotension: Secondary | ICD-10-CM | POA: Diagnosis not present

## 2021-10-07 LAB — CBC WITH DIFFERENTIAL/PLATELET
Abs Immature Granulocytes: 0.03 10*3/uL (ref 0.00–0.07)
Basophils Absolute: 0 10*3/uL (ref 0.0–0.1)
Basophils Relative: 1 %
Eosinophils Absolute: 0.2 10*3/uL (ref 0.0–0.5)
Eosinophils Relative: 3 %
HCT: 37.6 % (ref 36.0–46.0)
Hemoglobin: 13 g/dL (ref 12.0–15.0)
Immature Granulocytes: 1 %
Lymphocytes Relative: 22 %
Lymphs Abs: 1.4 10*3/uL (ref 0.7–4.0)
MCH: 31 pg (ref 26.0–34.0)
MCHC: 34.6 g/dL (ref 30.0–36.0)
MCV: 89.5 fL (ref 80.0–100.0)
Monocytes Absolute: 1.6 10*3/uL — ABNORMAL HIGH (ref 0.1–1.0)
Monocytes Relative: 25 %
Neutro Abs: 3.2 10*3/uL (ref 1.7–7.7)
Neutrophils Relative %: 48 %
Platelets: 178 10*3/uL (ref 150–400)
RBC: 4.2 MIL/uL (ref 3.87–5.11)
RDW: 13.1 % (ref 11.5–15.5)
WBC: 6.5 10*3/uL (ref 4.0–10.5)
nRBC: 0 % (ref 0.0–0.2)

## 2021-10-07 LAB — COMPREHENSIVE METABOLIC PANEL
ALT: 6 U/L (ref 0–44)
AST: 13 U/L — ABNORMAL LOW (ref 15–41)
Albumin: 3.8 g/dL (ref 3.5–5.0)
Alkaline Phosphatase: 40 U/L (ref 38–126)
Anion gap: 7 (ref 5–15)
BUN: 23 mg/dL (ref 8–23)
CO2: 23 mmol/L (ref 22–32)
Calcium: 8.7 mg/dL — ABNORMAL LOW (ref 8.9–10.3)
Chloride: 111 mmol/L (ref 98–111)
Creatinine, Ser: 0.55 mg/dL (ref 0.44–1.00)
GFR, Estimated: >60 mL/min (ref >60)
Glucose, Bld: 94 mg/dL (ref 70–99)
Potassium: 3.9 mmol/L (ref 3.5–5.1)
Sodium: 141 mmol/L (ref 135–145)
Total Bilirubin: 1.6 mg/dL — ABNORMAL HIGH (ref 0.3–1.2)
Total Protein: 6.3 g/dL — ABNORMAL LOW (ref 6.5–8.1)

## 2021-10-07 LAB — CORTISOL: Cortisol, Plasma: 19.7 ug/dL

## 2021-10-07 LAB — GLUCOSE, CAPILLARY: Glucose-Capillary: 99 mg/dL (ref 70–99)

## 2021-10-07 LAB — PHOSPHORUS: Phosphorus: 3.1 mg/dL (ref 2.5–4.6)

## 2021-10-07 LAB — MAGNESIUM: Magnesium: 2.2 mg/dL (ref 1.7–2.4)

## 2021-10-07 MED ORDER — SORBITOL 70 % SOLN
400.0000 mL | TOPICAL_OIL | Freq: Once | ORAL | Status: AC
  Administered 2021-10-07: 400 mL via RECTAL
  Filled 2021-10-07: qty 120

## 2021-10-07 MED ORDER — SODIUM CHLORIDE 0.9 % IV SOLN: INTRAVENOUS | Status: DC

## 2021-10-07 NOTE — Progress Notes (Signed)
Smog enema given to patient for constipation with impaction. Positive results, bowel movement achieved.

## 2021-10-07 NOTE — Progress Notes (Signed)
Patient c/o constipation, patient is impacted with abdominal pain. Notified MD, see new orders.

## 2021-10-07 NOTE — Progress Notes (Signed)
   10/07/21 2045  Assess: MEWS Score  Temp 98 F (36.7 C)  BP (!) 95/48  MAP (mmHg) (!) 62  Pulse Rate 80  Resp (!) 22  SpO2 96 %  O2 Device Nasal Cannula  O2 Flow Rate (L/min) 2 L/min  Assess: MEWS Score  MEWS Temp 0  MEWS Systolic 1  MEWS Pulse 0  MEWS RR 1  MEWS LOC 0  MEWS Score 2  MEWS Score Color Yellow  Assess: if the MEWS score is Yellow or Red  Were vital signs taken at a resting state? Yes  Focused Assessment Change from prior assessment (see assessment flowsheet)  Does the patient meet 2 or more of the SIRS criteria? Yes  Does the patient have a confirmed or suspected source of infection? No  MEWS guidelines implemented *See Row Information* Yes  Treat  Pain Scale 0-10  Pain Score 0  Take Vital Signs  Increase Vital Sign Frequency  Yellow: Q 2hr X 2 then Q 4hr X 2, if remains yellow, continue Q 4hrs  Escalate  MEWS: Escalate Yellow: discuss with charge nurse/RN and consider discussing with provider and RRT  Notify: Charge Nurse/RN  Name of Charge Nurse/RN Notified Nurse, children's  Date Charge Nurse/RN Notified 10/07/21  Time Charge Nurse/RN Notified 2100  Notify: Provider  Provider Name/Title Chinita Greenland NP  Date Provider Notified 10/07/21  Time Provider Notified 2100  Method of Notification  (Secured chat)  Notification Reason Other (Comment) (Yellow mews)  Provider response No new orders  Date of Provider Response 10/07/21  Time of Provider Response 2104  Notify: Rapid Response  Name of Rapid Response RN Notified No RRT notification  Document  Patient Outcome Other (Comment) (She's asymptomatic, no intervention was done)  Progress note created (see row info) Yes  Assess: SIRS CRITERIA  SIRS Temperature  0  SIRS Pulse 0  SIRS Respirations  1  SIRS WBC 0  SIRS Score Sum  1   The patient was asymptomatic with this BP. A & O x 4. Rechecked BP came normal. Will continue to monitor per [place] guidelines.

## 2021-10-07 NOTE — Progress Notes (Signed)
Patient c/o weakness and unable to stand up for orthostatic vital sign. No acute distress noted.

## 2021-10-07 NOTE — Progress Notes (Signed)
PROGRESS NOTE    Tami MillingJudith Vega  VWU:[id number] DOB: January 15, 1941 DOA: 10/05/2021 PCP: Crist FatVan Eyk, Jason, MD     Brief Narrative:  81 y.o. WF PMHx Hypothyroidism, Parkinson disease, and depression,   Presenting to the emergency department after 2 syncopal episodes in the last 24 hours.  Patient was seen in the emergency department on 10/02/2021 after a slip and fall, but did not believe that she lost consciousness at that time.  Yesterday, she was feeling lightheaded on standing, but then had a syncopal episode after she had been standing for a while and did not feel that she had any warning.  She denies any chest pain or palpitations associated with this.  She later went on to have a second syncopal episode that occurred just after using the commode and was preceded by lightheadedness when she stood up.  She denies any nausea, vomiting, diarrhea, fevers, or chills.  She has had some mild dysuria.   ED Course: Upon arrival to the ED, patient is found to be afebrile and saturating mid 90s on room air with stable blood pressure.  EKG features sinus rhythm and head CT was negative for acute intracranial abnormality.  Cervical spine CT was negative for acute findings.  Chemistry panel notable for an elevated BUN to creatinine ratio and potassium of 3.3.  Urine was nitrite positive.  Patient was given 500 cc of saline and 1 g IV Rocephin in the ED.   Subjective: 7/11 afebrile overnight A/O x4 sitting in bed comfortably.  After lunch will obtain orthostatic vitals, patient states she feels better. Lives at home with husband and son.   Assessment & Plan: Covid vaccination;   Principal Problem:   Syncope Active Problems:   Parkinson's disease (HCC)   Hypothyroidism   UTI (urinary tract infection)   E. coli UTI   Syncope  - Presents after 2 syncopal episodes, one with no warning and the other preceded by lightheadedness upon standing  - Check orthostatic vitals, continue cardiac monitoring, check  echocardiogram   -7/11 most likely exacerbated by patient's UTI infection.  Has mild diastolic CHF -7/11 normal saline 14900ml/hr  Acute diastolic CHF -Strict in and out the +740 ml - Daily weight Filed Weights   10/06/21 0406 10/07/21 0500  Weight: 69.4 kg 70.6 kg     Orthostatic Hypotension - 7/10 patient extremely orthostatic. - Entacapone can cause orthostatic hypotension.  Will consult neurology given that this is a Parkinson's medication. -7/11 although patient remains orthostatic BP significantly improved with hydration.  Hold on Neurology consult -7/11 serum Am Cortisol WNL -7/11 normal saline 18000ml/hr.  We will gently hydrate during patient's illness. - 7/11 we will consider starting Midodrine   positive E. Coli UTI -Complete 5-day course antibiotics.  Susceptibilities to follow   Parkinson's disease - Sinemet 25-100 mg 5 times per day - Entacapone 200 mg TID - Continue home regimen   Hypokalemia - Potassium goal> 4 - 7/10 K-Dur 60 mEq       Goals of care - 7/10 PT recommends SNF    Mobility Assessment (last 72 hours)     Mobility Assessment     Row Name 10/07/21 1133 10/06/21 1300 10/06/21 1118       Does patient have an order for bedrest or is patient medically unstable No - Continue assessment No - Continue assessment --     What is the highest level of mobility based on the progressive mobility assessment? Level 4 (Walks with assist in room) - Balance  while marching in place and cannot step forward and back - Complete Level 3 (Stands with assist) - Balance while standing  and cannot march in place Level 1 (Bedfast) - Unable to balance while sitting on edge of bed     Is the above level different from baseline mobility prior to current illness? Yes - Recommend PT order Yes - Recommend PT order --                Interdisciplinary Goals of Care Family Meeting   Date carried out: 10/07/2021  Location of the meeting:   Member's involved:    Durable Power of Insurance risk surveyor:     Discussion: We discussed goals of care for [employer] .    Code status:   Disposition:   Time spent for the meeting:     Drema Dallas, MD  10/07/2021, 6:39 PM         DVT prophylaxis: Lovenox Code Status: Full Family Communication:  Status is: Inpatient    Dispo: The patient is from: Home              Anticipated d/c is to: SNF              Anticipated d/c date is: > 3 days              Patient currently is not medically stable to d/c.      Consultants:    Procedures/Significant Events:  7/10 Echocardiogram  Left Ventricle: LVEF 65 to 70%.  -Mild asymmetric LV H of the basal-septal segment.  -Left ventricular diastolic parameters are consistent with Grade I diastolic dysfunction    I have personally reviewed and interpreted all radiology studies and my findings are as above.  VENTILATOR SETTINGS:    Cultures 7/10 urine positive E. coli    Antimicrobials: Anti-infectives (From admission, onward)    Start     Ordered Stop   10/07/21 0200  cefTRIAXone (ROCEPHIN) 1 g in sodium chloride 0.9 % 100 mL IVPB        10/06/21 0536     10/06/21 0215  cefTRIAXone (ROCEPHIN) 1 g in sodium chloride 0.9 % 100 mL IVPB        10/06/21 0212 10/06/21 0301         Devices    LINES / TUBES:      Continuous Infusions:  sodium chloride 100 mL/hr at 10/07/21 1310   cefTRIAXone (ROCEPHIN)  IV 1 g (10/07/21 0108)     Objective: Vitals:   10/07/21 0813 10/07/21 1259 10/07/21 1301 10/07/21 1305  BP: (!) 85/70 105/78 127/67 (!) 100/34  Pulse: 84 82 87 88  Resp:  18    Temp:  99 F (37.2 C)    TempSrc:  Oral    SpO2: 97% 95% 98% 99%  Weight:      Height:        Intake/Output Summary (Last 24 hours) at 10/07/2021 1839 Last data filed at 10/07/2021 1700 Gross per 24 hour  Intake 700 ml  Output 800 ml  Net -100 ml   Filed Weights   10/06/21 0406 10/07/21 0500  Weight: 69.4  kg 70.6 kg    Examination:  General: A/O x4, No acute respiratory distress Eyes: negative scleral hemorrhage, negative anisocoria, negative icterus ENT: Negative Runny nose, negative gingival bleeding, Neck:  Negative scars, masses, torticollis, lymphadenopathy, JVD Lungs: Clear to auscultation bilaterally without wheezes or crackles Cardiovascular: Regular rate and rhythm without murmur  gallop or rub normal S1 and S2 Abdomen: negative abdominal pain, nondistended, positive soft, bowel sounds, no rebound, no ascites, no appreciable mass Extremities: No significant cyanosis, clubbing, or edema bilateral lower extremities Skin: Negative rashes, lesions, ulcers, positive multiple hematoma on chin and right side of mandible C/W fall Psychiatric:  Negative depression, negative anxiety, negative fatigue, negative mania  Central nervous system:  Cranial nerves II through XII intact, tongue/uvula midline, all extremities muscle strength 5/5, sensation intact throughout, negative dysarthria, negative expressive aphasia, negative receptive aphasia.  .     Data Reviewed: Care during the described time interval was provided by me .  I have reviewed this patient's available data, including medical history, events of note, physical examination, and all test results as part of my evaluation.  CBC: Recent Labs  Lab 10/02/21 1943 10/05/21 2314 10/06/21 0816 10/07/21 0544  WBC 11.5* 8.3 6.9 6.5  NEUTROABS 7.1  --  3.7 3.2  HGB 14.0 12.9 13.2 13.0  HCT 39.7 37.1 38.0 37.6  MCV 86.3 88.8 88.8 89.5  PLT 190 182 190 178   Basic Metabolic Panel: Recent Labs  Lab 10/02/21 1943 10/05/21 2314 10/06/21 0816 10/07/21 0544  NA 140 138 142 141  K 3.5 3.3* 3.4* 3.9  CL 111 107 114* 111  CO2 20* GLUCOSE 103* 108* 94 94  BUN 25* 37* 25* 23  CREATININE 0.83 0.78 0.64 0.55  CALCIUM 8.9 8.7* 8.0* 8.7*  MG  --   --  2.1 2.2  PHOS  --   --  2.8 3.1   GFR: Estimated Creatinine Clearance:  52.7 mL/min (by C-G formula based on SCr of 0.55 mg/dL). Liver Function Tests: Recent Labs  Lab 10/02/21 1943 10/05/21 2314 10/06/21 0816 10/07/21 0544  AST 18 16 14* 13*  ALT ALKPHOS 43 41 38 40  BILITOT 1.2 1.5* 1.3* 1.6*  PROT 6.3* 6.1* 5.9* 6.3*  ALBUMIN 3.8 3.7 3.7 3.8   Recent Labs  Lab 10/02/21 1943 10/05/21 2314  LIPASE 36 33   No results for input(s): "AMMONIA" in the last 168 hours. Coagulation Profile: No results for input(s): "INR", "PROTIME" in the last 168 hours. Cardiac Enzymes: No results for input(s): "CKTOTAL", "CKMB", "CKMBINDEX", "TROPONINI" in the last 168 hours. BNP (last 3 results) No results for input(s): "PROBNP" in the last 8760 hours. HbA1C: No results for input(s): "HGBA1C" in the last 72 hours. CBG: Recent Labs  Lab 10/07/21 0544  GLUCAP 99   Lipid Profile: No results for input(s): "CHOL", "HDL", "LDLCALC", "TRIG", "CHOLHDL", "LDLDIRECT" in the last 72 hours. Thyroid Function Tests: No results for input(s): "TSH", "T4TOTAL", "FREET4", "T3FREE", "THYROIDAB" in the last 72 hours. Anemia Panel: No results for input(s): "VITAMINB12", "FOLATE", "FERRITIN", "TIBC", "IRON", "RETICCTPCT" in the last 72 hours. Sepsis Labs: No results for input(s): "PROCALCITON", "LATICACIDVEN" in the last 168 hours.  Recent Results (from the past 240 hour(s))  Urine Culture     Status: Abnormal (Preliminary result)   Collection Time: 10/06/21  5:34 AM   Specimen: Urine, Clean Catch  Result Value Ref Range Status   Specimen Description   Final    URINE, CLEAN CATCH Performed at [facility], [address] [place], [place] [address]    Special Requests   Final    NONE Performed at [facility], [address], [place], [place] [address]    Culture (A)  Final    >=100,000 COLONIES/mL ESCHERICHIA COLI SUSCEPTIBILITIES TO  FOLLOW Performed at [facility], [address], [place], [place] [address]     Report Status PENDING  Incomplete         Radiology Studies: DG Abd 1 View  Result Date: 10/07/2021 CLINICAL DATA:  Small bowel obstruction EXAM: ABDOMEN - 1 VIEW COMPARISON:  None Available. FINDINGS: No dilated large or small bowel. There is gas throughout the colon. No pathologic calcifications. No organomegaly. IMPRESSION: No evidence of bowel obstruction. Electronically Signed   By: Genevive Bi M.D.   On: 10/07/2021 13:44   ECHOCARDIOGRAM COMPLETE  Result Date: 10/06/2021    ECHOCARDIOGRAM REPORT   Patient Name:   Tami Vega Date of Exam: 10/06/2021 Medical Rec #:  [id number]      Height:       67.0 in Accession #:    [id number]     Weight:       153.0 lb Date of Birth:  03-11-1941      BSA:          1.805 m Patient Age:    81 years       BP:           140/61 mmHg Patient Gender: F              HR:           75 bpm. Exam Location:  Inpatient Procedure: 2D Echo, Cardiac Doppler and Color Doppler Indications:    R55 Syncope  History:        Patient has prior history of Echocardiogram examinations, most                 recent 10/11/2012. Signs/Symptoms:Chest Pain,                 Dizziness/Lightheadedness and Syncope.  Sonographer:    Sheralyn Boatman RDCS Referring Phys: [id number] TIMOTHY S OPYD  Sonographer Comments: Technically difficult study due to poor echo windows. Image acquisition challenging due to respiratory motion. IMPRESSIONS  1. Left ventricular ejection fraction, by estimation, is 65 to 70%. The left ventricle has normal function. The left ventricle has no regional wall motion abnormalities. There is mild asymmetric left ventricular hypertrophy of the basal-septal segment. Left ventricular diastolic parameters are consistent with Grade I diastolic dysfunction (impaired relaxation).  2. Right ventricular systolic function is normal. The right ventricular size is normal. Tricuspid regurgitation signal is inadequate for assessing PA pressure.  3. The mitral valve is normal in structure. No  evidence of mitral valve regurgitation. No evidence of mitral stenosis.  4. The aortic valve is tricuspid. Aortic valve regurgitation is not visualized. Aortic valve sclerosis is present, with no evidence of aortic valve stenosis. FINDINGS  Left Ventricle: Left ventricular ejection fraction, by estimation, is 65 to 70%. The left ventricle has normal function. The left ventricle has no regional wall motion abnormalities. The left ventricular internal cavity size was normal in size. There is  mild asymmetric left ventricular hypertrophy of the basal-septal segment. Left ventricular diastolic parameters are consistent with Grade I diastolic dysfunction (impaired relaxation). Right Ventricle: The right ventricular size is normal. No increase in right ventricular wall thickness. Right ventricular systolic function is normal. Tricuspid regurgitation signal is inadequate for assessing PA pressure. Left Atrium: Left atrial size was normal in size. Right Atrium: Right atrial size was normal in size. Pericardium: There is no evidence of pericardial effusion. Mitral Valve: The mitral valve is normal in structure. No evidence of mitral valve regurgitation. No evidence of mitral  valve stenosis. Tricuspid Valve: The tricuspid valve is normal in structure. Tricuspid valve regurgitation is trivial. Aortic Valve: The aortic valve is tricuspid. Aortic valve regurgitation is not visualized. Aortic valve sclerosis is present, with no evidence of aortic valve stenosis. Pulmonic Valve: The pulmonic valve was not well visualized. Pulmonic valve regurgitation is not visualized. Aorta: The aortic root and ascending aorta are structurally normal, with no evidence of dilitation. IAS/Shunts: The interatrial septum was not well visualized.  LEFT VENTRICLE PLAX 2D LVIDd:         3.70 cm     Diastology LVIDs:         2.30 cm     LV e' medial:    8.27 cm/s LV PW:         0.90 cm     LV E/e' medial:  6.3 LV IVS:        0.90 cm     LV e' lateral:    7.72 cm/s LVOT diam:     2.30 cm     LV E/e' lateral: 6.7 LV SV:         83 LV SV Index:   46 LVOT Area:     4.15 cm  LV Volumes (MOD) LV vol d, MOD A2C: 59.0 ml LV vol d, MOD A4C: 62.3 ml LV vol s, MOD A2C: 19.7 ml LV vol s, MOD A4C: 21.2 ml LV SV MOD A2C:     39.3 ml LV SV MOD A4C:     62.3 ml LV SV MOD BP:      41.4 ml RIGHT VENTRICLE            IVC RV S prime:     8.38 cm/s  IVC diam: 1.40 cm TAPSE (M-mode): 1.6 cm LEFT ATRIUM           Index        RIGHT ATRIUM           Index LA diam:      3.50 cm 1.94 cm/m   RA Area:     11.40 cm LA Vol (A2C): 33.7 ml 18.67 ml/m  RA Volume:   24.10 ml  13.35 ml/m LA Vol (A4C): 21.8 ml 12.08 ml/m  AORTIC VALVE LVOT Vmax:   96.00 cm/s LVOT Vmean:  61.700 cm/s LVOT VTI:    0.199 m  AORTA Ao Root diam: 2.60 cm Ao Asc diam:  3.20 cm MITRAL VALVE MV Area (PHT): 2.93 cm    SHUNTS MV Decel Time: 259 msec    Systemic VTI:  0.20 m MV E velocity: 52.05 cm/s  Systemic Diam: 2.30 cm MV A velocity: 87.20 cm/s MV E/A ratio:  0.60 Epifanio Lesches MD Electronically signed by Epifanio Lesches MD Signature Date/Time: 10/06/2021/10:25:28 AM    Final    CT Head Wo Contrast  Result Date: 10/06/2021 CLINICAL DATA:  Repetitive falls with headaches and neck pain, initial encounter EXAM: CT HEAD WITHOUT CONTRAST CT CERVICAL SPINE WITHOUT CONTRAST TECHNIQUE: Multidetector CT imaging of the head and cervical spine was performed following the standard protocol without intravenous contrast. Multiplanar CT image reconstructions of the cervical spine were also generated. RADIATION DOSE REDUCTION: This exam was performed according to the departmental dose-optimization program which includes automated exposure control, adjustment of the mA and/or kV according to patient size and/or use of iterative reconstruction technique. COMPARISON:  10/02/2021 FINDINGS: CT HEAD FINDINGS Brain: No evidence of acute infarction, hemorrhage, hydrocephalus, extra-axial collection or mass lesion/mass effect.  Mild atrophy is again identified.  Basal ganglia calcifications are seen bilaterally. Findings of empty sella are again seen and stable. Vascular: No hyperdense vessel or unexpected calcification. Skull: Normal. Negative for fracture or focal lesion. Sinuses/Orbits: No acute finding. Other: Scalp hematoma is noted in the left posterior parietal region near the vertex consistent with the recent injury. CT CERVICAL SPINE FINDINGS Alignment: Within normal limits. Skull base and vertebrae: 7 cervical segments are well visualized. Vertebral body height is well maintained. Mild osteophytic changes are seen. Facet hypertrophic changes are noted throughout the cervical spine. No acute fracture or acute facet abnormality is noted. Soft tissues and spinal canal: Surrounding soft tissue structures are within normal limits. Upper chest: Visualized lung apices are within normal limits. Other: None IMPRESSION: CT of the head: Chronic atrophic changes are again identified. No acute intracranial abnormality noted. New left parietal scalp hematoma near the vertex. CT of the cervical spine: Multilevel degenerative change without acute abnormality. The overall appearance is stable from the prior CT. Electronically Signed   By: Alcide Clever M.D.   On: 10/06/2021 02:05   CT Cervical Spine Wo Contrast  Result Date: 10/06/2021 CLINICAL DATA:  Repetitive falls with headaches and neck pain, initial encounter EXAM: CT HEAD WITHOUT CONTRAST CT CERVICAL SPINE WITHOUT CONTRAST TECHNIQUE: Multidetector CT imaging of the head and cervical spine was performed following the standard protocol without intravenous contrast. Multiplanar CT image reconstructions of the cervical spine were also generated. RADIATION DOSE REDUCTION: This exam was performed according to the departmental dose-optimization program which includes automated exposure control, adjustment of the mA and/or kV according to patient size and/or use of iterative reconstruction  technique. COMPARISON:  10/02/2021 FINDINGS: CT HEAD FINDINGS Brain: No evidence of acute infarction, hemorrhage, hydrocephalus, extra-axial collection or mass lesion/mass effect. Mild atrophy is again identified. Basal ganglia calcifications are seen bilaterally. Findings of empty sella are again seen and stable. Vascular: No hyperdense vessel or unexpected calcification. Skull: Normal. Negative for fracture or focal lesion. Sinuses/Orbits: No acute finding. Other: Scalp hematoma is noted in the left posterior parietal region near the vertex consistent with the recent injury. CT CERVICAL SPINE FINDINGS Alignment: Within normal limits. Skull base and vertebrae: 7 cervical segments are well visualized. Vertebral body height is well maintained. Mild osteophytic changes are seen. Facet hypertrophic changes are noted throughout the cervical spine. No acute fracture or acute facet abnormality is noted. Soft tissues and spinal canal: Surrounding soft tissue structures are within normal limits. Upper chest: Visualized lung apices are within normal limits. Other: None IMPRESSION: CT of the head: Chronic atrophic changes are again identified. No acute intracranial abnormality noted. New left parietal scalp hematoma near the vertex. CT of the cervical spine: Multilevel degenerative change without acute abnormality. The overall appearance is stable from the prior CT. Electronically Signed   By: Alcide Clever M.D.   On: 10/06/2021 02:05        Scheduled Meds:  carbidopa-levodopa  1 tablet Oral 5 X Daily   enoxaparin (LOVENOX) injection  40 mg Subcutaneous Q24H   entacapone  200 mg Oral TID   escitalopram  10 mg Oral BID   levothyroxine  75 mcg Oral q morning   pantoprazole  40 mg Oral Daily   rasagiline  1 mg Oral Daily   sodium chloride flush  3 mL Intravenous Q12H   Continuous Infusions:  sodium chloride 100 mL/hr at 10/07/21 1310   cefTRIAXone (ROCEPHIN)  IV 1 g (10/07/21 0108)     LOS: 0 days    Time  spent:40 min    Khanh Cordner, Roselind Messier, MD [facility]   If 7PM-7AM, please contact night-coverage 10/07/2021, 6:39 PM

## 2021-10-07 NOTE — TOC Progression Note (Signed)
Transition of Care [facility]) - Progression Note    Patient Details  Name: Tami Vega MRN: [id number] Date of Birth: 28-Apr-1940  [facility] CM/SW Contact  Otelia Santee, LCSW Phone Number: 10/07/2021, 9:41 AM  Clinical Narrative:    Spoke with pt's son regarding discharge plans and recommendations for this pt. Pt's son states that his father has dementia and he feels that his mother caring for him has been a huge stress and has exacerbated her parkinson symptoms. He has been trying to find LTC for his father to alleviate stress that is on his mother. Pt's son is agreeable to pt discharging to SNF. CSW reviewed bed offers with son and he plans to research the facilities and discuss with his mother prior to making a decision.    Expected Discharge Plan: Skilled Nursing Facility Barriers to Discharge: Continued Medical Work up  Expected Discharge Plan and Services Expected Discharge Plan: Skilled Nursing Facility In-house Referral: Clinical Social Work Discharge Planning Services: CM Consult Post Acute Care Choice: Skilled Nursing Facility Living arrangements for the past 2 months: Single Family Home                 DME Arranged: N/A DME Agency: NA                   Social Determinants of Health (SDOH) Interventions    Readmission Risk Interventions     No data to display

## 2021-10-08 DIAGNOSIS — G20 Parkinson's disease: Secondary | ICD-10-CM | POA: Diagnosis not present

## 2021-10-08 DIAGNOSIS — B9620 Unspecified Escherichia coli [E. coli] as the cause of diseases classified elsewhere: Secondary | ICD-10-CM | POA: Diagnosis not present

## 2021-10-08 DIAGNOSIS — N390 Urinary tract infection, site not specified: Secondary | ICD-10-CM | POA: Diagnosis not present

## 2021-10-08 DIAGNOSIS — R55 Syncope and collapse: Secondary | ICD-10-CM | POA: Diagnosis not present

## 2021-10-08 LAB — CBC WITH DIFFERENTIAL/PLATELET
Abs Immature Granulocytes: 0.03 10*3/uL (ref 0.00–0.07)
Basophils Absolute: 0 10*3/uL (ref 0.0–0.1)
Basophils Relative: 1 %
Eosinophils Absolute: 0.2 10*3/uL (ref 0.0–0.5)
Eosinophils Relative: 3 %
HCT: 35.2 % — ABNORMAL LOW (ref 36.0–46.0)
Hemoglobin: 12.1 g/dL (ref 12.0–15.0)
Immature Granulocytes: 1 %
Lymphocytes Relative: 29 %
Lymphs Abs: 1.6 10*3/uL (ref 0.7–4.0)
MCH: 30.9 pg (ref 26.0–34.0)
MCHC: 34.4 g/dL (ref 30.0–36.0)
MCV: 90 fL (ref 80.0–100.0)
Monocytes Absolute: 1.2 10*3/uL — ABNORMAL HIGH (ref 0.1–1.0)
Monocytes Relative: 22 %
Neutro Abs: 2.4 10*3/uL (ref 1.7–7.7)
Neutrophils Relative %: 44 %
Platelets: 145 10*3/uL — ABNORMAL LOW (ref 150–400)
RBC: 3.91 MIL/uL (ref 3.87–5.11)
RDW: 13.1 % (ref 11.5–15.5)
WBC: 5.5 10*3/uL (ref 4.0–10.5)
nRBC: 0 % (ref 0.0–0.2)

## 2021-10-08 LAB — COMPREHENSIVE METABOLIC PANEL
ALT: 10 U/L (ref 0–44)
AST: 12 U/L — ABNORMAL LOW (ref 15–41)
Albumin: 3.3 g/dL — ABNORMAL LOW (ref 3.5–5.0)
Alkaline Phosphatase: 40 U/L (ref 38–126)
Anion gap: 7 (ref 5–15)
BUN: 18 mg/dL (ref 8–23)
CO2: 22 mmol/L (ref 22–32)
Calcium: 8.2 mg/dL — ABNORMAL LOW (ref 8.9–10.3)
Chloride: 113 mmol/L — ABNORMAL HIGH (ref 98–111)
Creatinine, Ser: 0.49 mg/dL (ref 0.44–1.00)
GFR, Estimated: >60 mL/min (ref >60)
Glucose, Bld: 94 mg/dL (ref 70–99)
Potassium: 3.5 mmol/L (ref 3.5–5.1)
Sodium: 142 mmol/L (ref 135–145)
Total Bilirubin: 1.2 mg/dL (ref 0.3–1.2)
Total Protein: 5.7 g/dL — ABNORMAL LOW (ref 6.5–8.1)

## 2021-10-08 LAB — URINE CULTURE: Culture: >=100000 — AB

## 2021-10-08 LAB — GLUCOSE, CAPILLARY: Glucose-Capillary: 107 mg/dL — ABNORMAL HIGH (ref 70–99)

## 2021-10-08 LAB — MAGNESIUM: Magnesium: 2.1 mg/dL (ref 1.7–2.4)

## 2021-10-08 LAB — PHOSPHORUS: Phosphorus: 3 mg/dL (ref 2.5–4.6)

## 2021-10-08 MED ORDER — CEFDINIR 300 MG PO CAPS
300.0000 mg | ORAL_CAPSULE | Freq: Two times a day (BID) | ORAL | Status: AC
Start: 2021-10-08 — End: 2021-10-10
  Administered 2021-10-08 – 2021-10-10 (×4): 300 mg via ORAL
  Filled 2021-10-08 (×4): qty 1

## 2021-10-08 NOTE — TOC Progression Note (Signed)
Transition of Care [facility]) - Progression Note    Patient Details  Name: Rogue Pautler MRN: [id number] Date of Birth: March 25, 1941  Transition of Care [facility]) CM/SW Contact  Otelia Santee, LCSW Phone Number: 10/08/2021, 1:36 PM  Clinical Narrative:    Spoke with pt's son, Lagretta Loseke who states that he does not want pt to go to [facility] for SNF. He states he and his wife are to visit [facility] to decide if they would like to accept bed offer. CSW reiterated that this is the only other bed offer pt has received at this time. CSW will await to hear back from pt's son in regards to accepting SNF placement.    Expected Discharge Plan: Skilled Nursing Facility Barriers to Discharge: Continued Medical Work up  Expected Discharge Plan and Services Expected Discharge Plan: Skilled Nursing Facility In-house Referral: Clinical Social Work Discharge Planning Services: CM Consult Post Acute Care Choice: Skilled Nursing Facility Living arrangements for the past 2 months: Single Family Home                 DME Arranged: N/A DME Agency: NA                   Social Determinants of Health (SDOH) Interventions    Readmission Risk Interventions     No data to display

## 2021-10-08 NOTE — Progress Notes (Signed)
Cervical PROGRESS NOTE    Tami Vega  TIW:[id number] DOB: Aug 17, 1940 DOA: 10/05/2021 PCP: Crist Fat, MD   Brief Narrative:  Patient-year-old female with history of hypothyroidism, Parkinson disease and depression presented with 2 syncopal episodes.  On presentation, she was afebrile; CT head was negative for any acute intracranial abnormality; CT cervical spine was negative for acute findings.  UA was suggestive of UTI.  She was started on IV fluids and antibiotics.  Subsequently, PT has recommended SNF placement.  Assessment & Plan:   Syncope -Questionable cause.  Might be aggravated by UTI -Echo showed EF of 60-65 % percent with grade 1 diastolic dysfunction -No further episodes of syncope.  Treated with IV fluids and antibiotics.  PT recommending SNF placement.  TOC following.  Currently medically stable for discharge to SNF.  Chronic diastolic CHF -Currently compensated.  Acute diastolic CHF has been ruled out.  Strict and output.  Daily weights.  Fluid restriction.  Echo as above.  Outpatient follow-up with PCP/cardiology  Orthostatic hypotension -Possibly from dehydration.  Treated with IV fluids.  DC IV fluids today. -Blood pressure improving but still intermittently on the lower side.  Hold off on midodrine for now.  E. coli UTI: Present on admission -Completed 5-day course of antibiotics.  Currently on Rocephin  Parkinson's disease -Continue Sinemet and entacapone.  Outpatient follow-up with neurology  Hypothyroidism -Continue with levothyroxine  Leukocytosis -Resolved  Hypokalemia -Resolved  Physical deconditioning -Will need SNF placement.   DVT prophylaxis: Lovenox Code Status: Full Family Communication: None at bedside. Disposition Plan: Status is: Inpatient Remains inpatient appropriate because: Of need for SNF placement.  Currently medically stable for discharge    Consultants: None  Procedures: None  Antimicrobials: Rocephin from 10/05/2021  onwards   Subjective: Patient seen and examined at bedside.  Slow to respond, poor historian.  Denies fever, vomiting, abdominal pain.  Feels weak.  Objective: Vitals:   10/08/21 0425 10/08/21 0438 10/08/21 0600 10/08/21 0846  BP:  (!) 144/63  (!) 99/41  Pulse:  68  75  Resp:   16 16  Temp:  98.1 F (36.7 C)  98.1 F (36.7 C)  TempSrc:  Oral  Oral  SpO2:  96%  94%  Weight: 73.6 kg     Height:        Intake/Output Summary (Last 24 hours) at 10/08/2021 1028 Last data filed at 10/08/2021 0952 Gross per 24 hour  Intake 1883.03 ml  Output 400 ml  Net 1483.03 ml   Filed Weights   10/06/21 0406 10/07/21 0500 10/08/21 0425  Weight: 69.4 kg 70.6 kg 73.6 kg    Examination:  General exam: Appears calm and comfortable.  Currently on room air. Respiratory system: Bilateral decreased breath sounds at bases with some scattered crackles Cardiovascular system: S1 & S2 heard, Rate controlled Gastrointestinal system: Abdomen is nondistended, soft and nontender. Normal bowel sounds heard. Extremities: No cyanosis, clubbing; trace lower extremity edema present Central nervous system: Awake, slow to respond, poor historian.  No focal neurological deficits. Moving extremities Skin: Multiple hematoma on chin and right side of mandible.  No rashes/ulcers noted  psychiatry: Mostly flat affect.  No signs of agitation.   Data Reviewed: I have personally reviewed following labs and imaging studies  CBC: Recent Labs  Lab 10/02/21 1943 10/05/21 2314 10/06/21 0816 10/07/21 0544 10/08/21 0558  WBC 11.5* 8.3 6.9 6.5 5.5  NEUTROABS 7.1  --  3.7 3.2 2.4  HGB 14.0 12.9 13.2 13.0 12.1  HCT 39.7 37.1 38.0  37.6 35.2*  MCV 86.3 88.8 88.8 89.5 90.0  PLT 190 182 190 178 145*   Basic Metabolic Panel: Recent Labs  Lab 10/02/21 1943 10/05/21 2314 10/06/21 0816 10/07/21 0544 10/08/21 0558  NA 140 138 142 141 142  K 3.5 3.3* 3.4* 3.9 3.5  CL 111 107 114* 111 113*  CO2 20* 24 22 23 22   GLUCOSE  103* 108* 94 94 94  BUN 25* 37* 25* 23 18  CREATININE 0.83 0.78 0.64 0.55 0.49  CALCIUM 8.9 8.7* 8.0* 8.7* 8.2*  MG  --   --  2.1 2.2 2.1  PHOS  --   --  2.8 3.1 3.0   GFR: Estimated Creatinine Clearance: 52.7 mL/min (by C-G formula based on SCr of 0.49 mg/dL). Liver Function Tests: Recent Labs  Lab 10/02/21 1943 10/05/21 2314 10/06/21 0816 10/07/21 0544 10/08/21 0558  AST 18 16 14* 13* 12*  ALT 11 7 6 6 10   ALKPHOS 43 41 38 40 40  BILITOT 1.2 1.5* 1.3* 1.6* 1.2  PROT 6.3* 6.1* 5.9* 6.3* 5.7*  ALBUMIN 3.8 3.7 3.7 3.8 3.3*   Recent Labs  Lab 10/02/21 1943 10/05/21 2314  LIPASE 36 33   No results for input(s): "AMMONIA" in the last 168 hours. Coagulation Profile: No results for input(s): "INR", "PROTIME" in the last 168 hours. Cardiac Enzymes: No results for input(s): "CKTOTAL", "CKMB", "CKMBINDEX", "TROPONINI" in the last 168 hours. BNP (last 3 results) No results for input(s): "PROBNP" in the last 8760 hours. HbA1C: No results for input(s): "HGBA1C" in the last 72 hours. CBG: Recent Labs  Lab 10/07/21 0544 10/08/21 0423  GLUCAP 99 107*   Lipid Profile: No results for input(s): "CHOL", "HDL", "LDLCALC", "TRIG", "CHOLHDL", "LDLDIRECT" in the last 72 hours. Thyroid Function Tests: No results for input(s): "TSH", "T4TOTAL", "FREET4", "T3FREE", "THYROIDAB" in the last 72 hours. Anemia Panel: No results for input(s): "VITAMINB12", "FOLATE", "FERRITIN", "TIBC", "IRON", "RETICCTPCT" in the last 72 hours. Sepsis Labs: No results for input(s): "PROCALCITON", "LATICACIDVEN" in the last 168 hours.  Recent Results (from the past 240 hour(s))  Urine Culture     Status: Abnormal (Preliminary result)   Collection Time: 10/06/21  5:34 AM   Specimen: Urine, Clean Catch  Result Value Ref Range Status   Specimen Description   Final    URINE, CLEAN CATCH Performed at [facility], [address] [place], [place]    Special Requests   Final     NONE Performed at [facility], [address], [place], [place]    Culture (A)  Final    >=100,000 COLONIES/mL ESCHERICHIA COLI SUSCEPTIBILITIES TO FOLLOW Performed at [facility], [address] [place], [address] [place]    Report Status PENDING  Incomplete         Radiology Studies: DG Abd 1 View  Result Date: 10/07/2021 CLINICAL DATA:  Small bowel obstruction EXAM: ABDOMEN - 1 VIEW COMPARISON:  None Available. FINDINGS: No dilated large or small bowel. There is gas throughout the colon. No pathologic calcifications. No organomegaly. IMPRESSION: No evidence of bowel obstruction. Electronically Signed   By: [id number] M.D.   On: 10/07/2021 13:44        Scheduled Meds:  carbidopa-levodopa  1 tablet Oral 5 X Daily   enoxaparin (LOVENOX) injection  40 mg Subcutaneous Q24H   entacapone  200 mg Oral TID   escitalopram  10 mg Oral BID   levothyroxine  75 mcg Oral q  morning   pantoprazole  40 mg Oral Daily   rasagiline  1 mg Oral Daily   sodium chloride flush  3 mL Intravenous Q12H   Continuous Infusions:  cefTRIAXone (ROCEPHIN)  IV 1 g (10/08/21 0022)          Glade Lloyd, MD [facility] 10/08/2021, 10:28 AM

## 2021-10-09 DIAGNOSIS — E039 Hypothyroidism, unspecified: Secondary | ICD-10-CM | POA: Diagnosis not present

## 2021-10-09 DIAGNOSIS — R55 Syncope and collapse: Secondary | ICD-10-CM | POA: Diagnosis not present

## 2021-10-09 DIAGNOSIS — N390 Urinary tract infection, site not specified: Secondary | ICD-10-CM | POA: Diagnosis not present

## 2021-10-09 DIAGNOSIS — G20 Parkinson's disease: Secondary | ICD-10-CM | POA: Diagnosis not present

## 2021-10-09 MED ORDER — CEFDINIR 300 MG PO CAPS: 300.0000 mg | ORAL_CAPSULE | Freq: Two times a day (BID) | ORAL | 0 refills | Status: AC

## 2021-10-09 NOTE — TOC Progression Note (Signed)
Transition of Care [facility]) - Progression Note    Patient Details  Name: Tami Vega MRN: [id number] Date of Birth: [birth year]  [facility] CM/SW Contact  Otelia Santee, LCSW Phone Number: 10/09/2021, 8:48 AM  Clinical Narrative:    Spoke with pt's son who has accepted bed offer for [facility]. Pt is able to transfer to SNF once insurance authorization has been approved.    Expected Discharge Plan: Skilled Nursing Facility Barriers to Discharge: Continued Medical Work up  Expected Discharge Plan and Services Expected Discharge Plan: Skilled Nursing Facility In-house Referral: Clinical Social Work Discharge Planning Services: CM Consult Post Acute Care Choice: Skilled Nursing Facility Living arrangements for the past 2 months: Single Family Home                 DME Arranged: N/A DME Agency: NA                   Social Determinants of Health (SDOH) Interventions    Readmission Risk Interventions     No data to display

## 2021-10-09 NOTE — Plan of Care (Signed)
  Problem: Education: Goal: Knowledge of General Education information will improve Description Including pain rating scale, medication(s)/side effects and non-pharmacologic comfort measures Outcome: Progressing   

## 2021-10-09 NOTE — Progress Notes (Signed)
Physical Therapy Treatment Patient Details Name: Tami Vega MRN: [id number] DOB: 30-Apr-1940 Today's Date: 10/09/2021   History of Present Illness Tami Vega is a 81 y.o. female presents for evaluation after fall at home; of note, pt seen in the ED on 7/6 with fall, had a mouth laceration which was repaired and discharged home. Head CT and cervical CT negative for acute findings. PMH: parkinson's disease    PT Comments    Pt semi-reclined in recliner and agreeable to be seen, reporting no pain. Took orthostatic vitals and pt highly positive from sitting to standing (see table below) with symptom report of dizziness, lightheadedness, and unable to fixate eyes, mobility deferred as it is unsafe. Pt is not progressing secondary to inability to mobilize OOB safely. Pt did complete reclined BLE general exercise program and encouraged pt to complete additional repetitions outside of therapy sessions. Discharge destination remains appropriate. We will continue to follow acutely.   10/09/21 1100  Orthostatic Lying   BP- Lying 107/82  Pulse- Lying 76  Orthostatic Sitting  BP- Sitting 108/85  Pulse- Sitting 87  Orthostatic Standing at 0 minutes  BP- Standing at 0 minutes (!) 49/40  Pulse- Standing at 0 minutes 93  Orthostatic Standing at 3 minutes  BP- Standing at 3 minutes  (Unable)     Recommendations for follow up therapy are one component of a multi-disciplinary discharge planning process, led by the attending physician.  Recommendations may be updated based on patient status, additional functional criteria and insurance authorization.  Follow Up Recommendations  Skilled nursing-short term rehab (<3 hours/day) Can patient physically be transported by private vehicle: No   Assistance Recommended at Discharge Frequent or constant Supervision/Assistance  Patient can return home with the following A lot of help with walking and/or transfers;A lot of help with  bathing/dressing/bathroom;Assistance with cooking/housework;Assist for transportation;Help with stairs or ramp for entrance   Equipment Recommendations  None recommended by PT    Recommendations for Other Services       Precautions / Restrictions Precautions Precautions: Fall Precaution Comments: monitor BP (OH, syncope) Restrictions Weight Bearing Restrictions: No     Mobility  Bed Mobility               General bed mobility comments: Pt in recliner at entry and exit    Transfers Overall transfer level: Needs assistance Equipment used: Rolling walker (2 wheels) Transfers: Sit to/from Stand Sit to Stand: Min guard           General transfer comment: Pt min gaurd for safety only, no physical assist required. After ~60s standing, pt highly orthostatic, dizzy, unable to focus eyes, head bobbing, returned to semi-reclined position in recliner, BP recovered to 78/51 and pt reporting cessation of symptoms.    Ambulation/Gait               General Gait Details: deferred, unsafe at present (BP)   Stairs             Wheelchair Mobility    Modified Rankin (Stroke Patients Only)       Balance Overall balance assessment: History of Falls, Needs assistance Sitting-balance support: Feet supported, No upper extremity supported Sitting balance-Leahy Scale: Good     Standing balance support: Single extremity supported, Reliant on assistive device for balance Standing balance-Leahy Scale: Poor Standing balance comment: Pt able to stand for ~60s with single UE support on RW for orthostatic vitals, however report of dizziness and lightheadedness so returned to sitting safely.  Cognition Arousal/Alertness: Awake/alert Behavior During Therapy: Flat affect Overall Cognitive Status: No family/caregiver present to determine baseline cognitive functioning                                           Exercises General Exercises - Lower Extremity Ankle Circles/Pumps: AROM, Both, 10 reps, Seated Heel Slides: AROM, Both, 10 reps, Supine Hip ABduction/ADduction: AROM, Both, 10 reps, Supine Straight Leg Raises: AROM, Both, 10 reps, Supine    General Comments        Pertinent Vitals/Pain Pain Assessment Pain Assessment: No/denies pain    Home Living                          Prior Function            PT Goals (current goals can now be found in the care plan section) Acute Rehab PT Goals Patient Stated Goal: home with son to assist PT Goal Formulation: With patient Time For Goal Achievement: 10/20/21 Potential to Achieve Goals: Good Progress towards PT goals: Not progressing toward goals - comment (Orthostasis limiting mobility)    Frequency    Min 2X/week      PT Plan Current plan remains appropriate    Co-evaluation              AM-PAC PT "6 Clicks" Mobility   Outcome Measure  Help needed turning from your back to your side while in a flat bed without using bedrails?: A Lot Help needed moving from lying on your back to sitting on the side of a flat bed without using bedrails?: A Lot Help needed moving to and from a bed to a chair (including a wheelchair)?: A Lot Help needed standing up from a chair using your arms (e.g., wheelchair or bedside chair)?: A Lot Help needed to walk in hospital room?: Total Help needed climbing 3-5 steps with a railing? : Total 6 Click Score: 10    End of Session Equipment Utilized During Treatment: Gait belt Activity Tolerance: Treatment limited secondary to medical complications (Comment) (Orthostasis) Patient left: with call bell/phone within reach;in chair Nurse Communication: Mobility status;Other (comment) (Orthostasis) PT Visit Diagnosis: Unsteadiness on feet (R26.81);Other abnormalities of gait and mobility (R26.89);Muscle weakness (generalized) (M62.81);History of falling (Z91.81)     Time: 1040-1102 PT  Time Calculation (min) (ACUTE ONLY): 22 min  Charges:  $Therapeutic Exercise: 8-22 mins                     Jamesetta Geralds, PT, DPT [facility] Rehabilitation Department Office: [phone] Pager: [phone]   Jamesetta Geralds 10/09/2021, 11:09 AM

## 2021-10-09 NOTE — Discharge Summary (Signed)
Physician Discharge Summary  Tami Vega WUJ:[id number] DOB: [birth year] DOA: 10/05/2021  PCP: Crist Fat, MD  Admit date: 10/05/2021 Discharge date: 10/09/2021  Admitted From: Home Disposition: SNF  Recommendations for Outpatient Follow-up:  Follow up with SNF provider at earliest convenience Follow up in ED if symptoms worsen or new appear   Home Health: No Equipment/Devices: None  Discharge Condition: Stable CODE STATUS: Full Diet recommendation: Heart healthy  Brief/Interim Summary: Patient-year-old female with history of hypothyroidism, Parkinson disease and depression presented with 2 syncopal episodes.  On presentation, she was afebrile; CT head was negative for any acute intracranial abnormality; CT cervical spine was negative for acute findings.  UA was suggestive of UTI.  She was started on IV fluids and antibiotics.  Subsequently, PT has recommended SNF placement.  Discharge to SNF once bed is available.  Discharge Diagnoses:   Syncope -Questionable cause.  Might be aggravated by UTI -Echo showed EF of 60-65 % percent with grade 1 diastolic dysfunction -No further episodes of syncope.  Treated with IV fluids and antibiotics.  PT recommending SNF placement.  TOC following.  Currently medically stable for discharge to SNF.   Chronic diastolic CHF -Currently compensated.  Acute diastolic CHF has been ruled out.  Continue fluid and diet restrictions.  Echo as above.  Outpatient follow-up with PCP/cardiology   Orthostatic hypotension -Possibly from dehydration.  Treated with IV fluids.  Off IV fluids. -Blood pressure improving Syncope -Questionable cause.  Might be aggravated by UTI -Echo showed EF of 60-65 % percent with grade 1 diastolic dysfunction -No further episodes of syncope.  Treated with IV fluids and antibiotics.  PT recommending SNF placement.  TOC following.  Currently medically stable for discharge to SNF.   Chronic diastolic CHF -Currently compensated.   Acute diastolic CHF has been ruled out.  Strict and output.  Daily weights.  Fluid restriction.  Echo as above.  Outpatient follow-up with PCP/cardiology   Orthostatic hypotension -Possibly from dehydration.  Treated with IV fluids.  DC IV fluids today. -Blood pressure improving.  E. coli UTI: Present on admission -Initially on Rocephin and switched to cefdinir.  Complete 5-day course of antibiotic therapy   Parkinson's disease -Continue Sinemet and entacapone.  Outpatient follow-up with neurology  Hypothyroidism -Continue with levothyroxine  Leukocytosis -Resolved  Hypokalemia -Resolved   Physical deconditioning -Will need SNF placement.     Discharge Instructions   Allergies as of 10/09/2021   No Known Allergies      Medication List     STOP taking these medications    D-Mannose 500 MG Caps   multivitamin with minerals Tabs tablet       TAKE these medications    b complex vitamins tablet Take 1 tablet by mouth daily.   CALCIUM 1200 PO Take 1 tablet by mouth daily. Take one tablet a day   carbidopa-levodopa 25-100 MG tablet Commonly known as: SINEMET IR Take 1 tablet by mouth 5 (five) times daily. Take at  7 AM, 10 AM, 1 PM, 4 PM and  7 PM What changed: Another medication with the same name was removed. Continue taking this medication, and follow the directions you see here.   cefdinir 300 MG capsule Commonly known as: OMNICEF Take 1 capsule (300 mg total) by mouth every 12 (twelve) hours for 1 day.   entacapone 200 MG tablet Commonly known as: COMTAN Take 1 tablet (200 mg total) by mouth 3 (three) times daily. Take with Sinemet dose at 7 AM, 1 PM and  7 PM.   escitalopram 20 MG tablet Commonly known as: LEXAPRO Take 10 mg by mouth 2 (two) times daily.   fish oil-omega-3 fatty acids 1000 MG capsule Take 1 g by mouth daily.   levothyroxine 75 MCG tablet Commonly known as: SYNTHROID Take 75 mcg by mouth every morning.   OMEPRAZOLE PO Take 20  mg by mouth daily.   polyethylene glycol 17 g packet Commonly known as: MIRALAX / GLYCOLAX Take 17 g by mouth daily as needed for mild constipation.   PROBIOTIC PO Take 1 capsule by mouth daily.   rasagiline 1 MG Tabs tablet Commonly known as: AZILECT Take 1 tablet (1 mg total) by mouth daily.   Vitamin D3 25 MCG tablet Commonly known as: Vitamin D Take 1 tablet (1,000 Units total) by mouth daily.         No Known Allergies  Consultations: None   Procedures/Studies: DG Abd 1 View  Result Date: 10/07/2021 CLINICAL DATA:  Small bowel obstruction EXAM: ABDOMEN - 1 VIEW COMPARISON:  None Available. FINDINGS: No dilated large or small bowel. There is gas throughout the colon. No pathologic calcifications. No organomegaly. IMPRESSION: No evidence of bowel obstruction. Electronically Signed   By: Genevive Bi M.D.   On: 10/07/2021 13:44   ECHOCARDIOGRAM COMPLETE  Result Date: 10/06/2021    ECHOCARDIOGRAM REPORT   Patient Name:   Tami Vega Date of Exam: 10/06/2021 Medical Rec #:  [id number]      Height:       67.0 in Accession #:    [id number]     Weight:       153.0 lb Date of Birth:  05/16/1940      BSA:          1.805 m Patient Age:    81 years       BP:           140/61 mmHg Patient Gender: F              HR:           75 bpm. Exam Location:  Inpatient Procedure: 2D Echo, Cardiac Doppler and Color Doppler Indications:    R55 Syncope  History:        Patient has prior history of Echocardiogram examinations, most                 recent 10/11/2012. Signs/Symptoms:Chest Pain,                 Dizziness/Lightheadedness and Syncope.  Sonographer:    Sheralyn Boatman RDCS Referring Phys: [id number] TIMOTHY S OPYD  Sonographer Comments: Technically difficult study due to poor echo windows. Image acquisition challenging due to respiratory motion. IMPRESSIONS  1. Left ventricular ejection fraction, by estimation, is 65 to 70%. The left ventricle has normal function. The left ventricle has no regional  wall motion abnormalities. There is mild asymmetric left ventricular hypertrophy of the basal-septal segment. Left ventricular diastolic parameters are consistent with Grade I diastolic dysfunction (impaired relaxation).  2. Right ventricular systolic function is normal. The right ventricular size is normal. Tricuspid regurgitation signal is inadequate for assessing PA pressure.  3. The mitral valve is normal in structure. No evidence of mitral valve regurgitation. No evidence of mitral stenosis.  4. The aortic valve is tricuspid. Aortic valve regurgitation is not visualized. Aortic valve sclerosis is present, with no evidence of aortic valve stenosis. FINDINGS  Left Ventricle: Left ventricular ejection fraction, by estimation, is 65 to 70%. The left  ventricle has normal function. The left ventricle has no regional wall motion abnormalities. The left ventricular internal cavity size was normal in size. There is  mild asymmetric left ventricular hypertrophy of the basal-septal segment. Left ventricular diastolic parameters are consistent with Grade I diastolic dysfunction (impaired relaxation). Right Ventricle: The right ventricular size is normal. No increase in right ventricular wall thickness. Right ventricular systolic function is normal. Tricuspid regurgitation signal is inadequate for assessing PA pressure. Left Atrium: Left atrial size was normal in size. Right Atrium: Right atrial size was normal in size. Pericardium: There is no evidence of pericardial effusion. Mitral Valve: The mitral valve is normal in structure. No evidence of mitral valve regurgitation. No evidence of mitral valve stenosis. Tricuspid Valve: The tricuspid valve is normal in structure. Tricuspid valve regurgitation is trivial. Aortic Valve: The aortic valve is tricuspid. Aortic valve regurgitation is not visualized. Aortic valve sclerosis is present, with no evidence of aortic valve stenosis. Pulmonic Valve: The pulmonic valve was not  well visualized. Pulmonic valve regurgitation is not visualized. Aorta: The aortic root and ascending aorta are structurally normal, with no evidence of dilitation. IAS/Shunts: The interatrial septum was not well visualized.  LEFT VENTRICLE PLAX 2D LVIDd:         3.70 cm     Diastology LVIDs:         2.30 cm     LV e' medial:    8.27 cm/s LV PW:         0.90 cm     LV E/e' medial:  6.3 LV IVS:        0.90 cm     LV e' lateral:   7.72 cm/s LVOT diam:     2.30 cm     LV E/e' lateral: 6.7 LV SV:         83 LV SV Index:   46 LVOT Area:     4.15 cm  LV Volumes (MOD) LV vol d, MOD A2C: 59.0 ml LV vol d, MOD A4C: 62.3 ml LV vol s, MOD A2C: 19.7 ml LV vol s, MOD A4C: 21.2 ml LV SV MOD A2C:     39.3 ml LV SV MOD A4C:     62.3 ml LV SV MOD BP:      41.4 ml RIGHT VENTRICLE            IVC RV S prime:     8.38 cm/s  IVC diam: 1.40 cm TAPSE (M-mode): 1.6 cm LEFT ATRIUM           Index        RIGHT ATRIUM           Index LA diam:      3.50 cm 1.94 cm/m   RA Area:     11.40 cm LA Vol (A2C): 33.7 ml 18.67 ml/m  RA Volume:   24.10 ml  13.35 ml/m LA Vol (A4C): 21.8 ml 12.08 ml/m  AORTIC VALVE LVOT Vmax:   96.00 cm/s LVOT Vmean:  61.700 cm/s LVOT VTI:    0.199 m  AORTA Ao Root diam: 2.60 cm Ao Asc diam:  3.20 cm MITRAL VALVE MV Area (PHT): 2.93 cm    SHUNTS MV Decel Time: 259 msec    Systemic VTI:  0.20 m MV E velocity: 52.05 cm/s  Systemic Diam: 2.30 cm MV A velocity: 87.20 cm/s MV E/A ratio:  0.60 Epifanio Lesches MD Electronically signed by Epifanio Lesches MD Signature Date/Time: 10/06/2021/10:25:28 AM    Final  CT Head Wo Contrast  Result Date: 10/06/2021 CLINICAL DATA:  Repetitive falls with headaches and neck pain, initial encounter EXAM: CT HEAD WITHOUT CONTRAST CT CERVICAL SPINE WITHOUT CONTRAST TECHNIQUE: Multidetector CT imaging of the head and cervical spine was performed following the standard protocol without intravenous contrast. Multiplanar CT image reconstructions of the cervical spine were also  generated. RADIATION DOSE REDUCTION: This exam was performed according to the departmental dose-optimization program which includes automated exposure control, adjustment of the mA and/or kV according to patient size and/or use of iterative reconstruction technique. COMPARISON:  10/02/2021 FINDINGS: CT HEAD FINDINGS Brain: No evidence of acute infarction, hemorrhage, hydrocephalus, extra-axial collection or mass lesion/mass effect. Mild atrophy is again identified. Basal ganglia calcifications are seen bilaterally. Findings of empty sella are again seen and stable. Vascular: No hyperdense vessel or unexpected calcification. Skull: Normal. Negative for fracture or focal lesion. Sinuses/Orbits: No acute finding. Other: Scalp hematoma is noted in the left posterior parietal region near the vertex consistent with the recent injury. CT CERVICAL SPINE FINDINGS Alignment: Within normal limits. Skull base and vertebrae: 7 cervical segments are well visualized. Vertebral body height is well maintained. Mild osteophytic changes are seen. Facet hypertrophic changes are noted throughout the cervical spine. No acute fracture or acute facet abnormality is noted. Soft tissues and spinal canal: Surrounding soft tissue structures are within normal limits. Upper chest: Visualized lung apices are within normal limits. Other: None IMPRESSION: CT of the head: Chronic atrophic changes are again identified. No acute intracranial abnormality noted. New left parietal scalp hematoma near the vertex. CT of the cervical spine: Multilevel degenerative change without acute abnormality. The overall appearance is stable from the prior CT. Electronically Signed   By: Alcide Clever M.D.   On: 10/06/2021 02:05   CT Cervical Spine Wo Contrast  Result Date: 10/06/2021 CLINICAL DATA:  Repetitive falls with headaches and neck pain, initial encounter EXAM: CT HEAD WITHOUT CONTRAST CT CERVICAL SPINE WITHOUT CONTRAST TECHNIQUE: Multidetector CT imaging  of the head and cervical spine was performed following the standard protocol without intravenous contrast. Multiplanar CT image reconstructions of the cervical spine were also generated. RADIATION DOSE REDUCTION: This exam was performed according to the departmental dose-optimization program which includes automated exposure control, adjustment of the mA and/or kV according to patient size and/or use of iterative reconstruction technique. COMPARISON:  10/02/2021 FINDINGS: CT HEAD FINDINGS Brain: No evidence of acute infarction, hemorrhage, hydrocephalus, extra-axial collection or mass lesion/mass effect. Mild atrophy is again identified. Basal ganglia calcifications are seen bilaterally. Findings of empty sella are again seen and stable. Vascular: No hyperdense vessel or unexpected calcification. Skull: Normal. Negative for fracture or focal lesion. Sinuses/Orbits: No acute finding. Other: Scalp hematoma is noted in the left posterior parietal region near the vertex consistent with the recent injury. CT CERVICAL SPINE FINDINGS Alignment: Within normal limits. Skull base and vertebrae: 7 cervical segments are well visualized. Vertebral body height is well maintained. Mild osteophytic changes are seen. Facet hypertrophic changes are noted throughout the cervical spine. No acute fracture or acute facet abnormality is noted. Soft tissues and spinal canal: Surrounding soft tissue structures are within normal limits. Upper chest: Visualized lung apices are within normal limits. Other: None IMPRESSION: CT of the head: Chronic atrophic changes are again identified. No acute intracranial abnormality noted. New left parietal scalp hematoma near the vertex. CT of the cervical spine: Multilevel degenerative change without acute abnormality. The overall appearance is stable from the prior CT. Electronically Signed   By:  Alcide CleverMark  Lukens M.D.   On: 10/06/2021 02:05   CT Maxillofacial Wo Contrast  Result Date: 10/02/2021 CLINICAL  DATA:  Facial trauma, blunt Fall in the bathroom hitting head and mouth. EXAM: CT MAXILLOFACIAL WITHOUT CONTRAST TECHNIQUE: Multidetector CT imaging of the maxillofacial structures was performed. Multiplanar CT image reconstructions were also generated. RADIATION DOSE REDUCTION: This exam was performed according to the departmental dose-optimization program which includes automated exposure control, adjustment of the mA and/or kV according to patient size and/or use of iterative reconstruction technique. COMPARISON:  No prior face CT. Included portions from head CT 06/13/2020 reviewed FINDINGS: Osseous: No acute fracture of the nasal bone, zygomatic arches, or mandibles. Slight leftward nasal septal deviation. Temporomandibular joints are congruent. Edentulous of upper teeth. Orbits: No orbital fracture or globe injury. Bilateral cataract resection. Sinuses: No sinus fracture or fluid level. Unchanged partially calcified soft tissue density or small mass in the right sphenoethmoidal recess. No associated bony destruction. No mastoid effusion. Soft tissues: Soft tissue laceration over the left chin with small hematoma. No radiopaque foreign body. Limited intracranial: Assessed on concurrent head CT, reported separately. IMPRESSION: 1. Soft tissue laceration over the left chin with small hematoma. No acute facial bone fracture. 2. Unchanged partially calcified soft tissue density or small mass in the right superior nasal passage. This is stable dating back to 2021 head CT. As clinically indicated, consider ENT evaluation. Electronically Signed   By: Narda RutherfordMelanie  Sanford M.D.   On: 10/02/2021 21:35   CT Cervical Spine Wo Contrast  Result Date: 10/02/2021 CLINICAL DATA:  Neck trauma (Age >= 65y) Fall in the bathroom hitting head and mouth. EXAM: CT CERVICAL SPINE WITHOUT CONTRAST TECHNIQUE: Multidetector CT imaging of the cervical spine was performed without intravenous contrast. Multiplanar CT image reconstructions were  also generated. RADIATION DOSE REDUCTION: This exam was performed according to the departmental dose-optimization program which includes automated exposure control, adjustment of the mA and/or kV according to patient size and/or use of iterative reconstruction technique. COMPARISON:  Cervical spine CT 11/02/2019 FINDINGS: Patient had difficulty tolerating the exam, there is motion artifact. Alignment: Normal. Skull base and vertebrae: No acute fracture. Cervical vertebral body heights are maintained. Mild chronic T1 superior endplate compression fracture, stable from prior. The dens and skull base are intact. Soft tissues and spinal canal: No prevertebral fluid or swelling. No visible canal hematoma. Disc levels: Mild multilevel degenerative disc disease most prominent at C6-C7. Mild multilevel facet hypertrophy. Upper chest: No acute findings. Similar apical septal thickening to prior exam, favored to be chronic. Other: Carotid calcifications. IMPRESSION: 1. No acute fracture or subluxation of the cervical spine. 2. Mild multilevel degenerative disc disease and facet hypertrophy. Electronically Signed   By: Narda RutherfordMelanie  Sanford M.D.   On: 10/02/2021 21:29   CT Head Wo Contrast  Result Date: 10/02/2021 CLINICAL DATA:  Trauma. Fall in the bathroom striking head and mouth. EXAM: CT HEAD WITHOUT CONTRAST TECHNIQUE: Contiguous axial images were obtained from the base of the skull through the vertex without intravenous contrast. RADIATION DOSE REDUCTION: This exam was performed according to the departmental dose-optimization program which includes automated exposure control, adjustment of the mA and/or kV according to patient size and/or use of iterative reconstruction technique. COMPARISON:  Head CT 06/13/2020 FINDINGS: Brain: No intracranial hemorrhage, mass effect, or midline shift. Age related atrophy. No hydrocephalus. The basilar cisterns are patent. No evidence of territorial infarct or acute ischemia. Enlarged  partially empty sella, typically incidental. No extra-axial or intracranial fluid collection. Vascular: Atherosclerosis  of skullbase vasculature without hyperdense vessel or abnormal calcification. Skull: No fracture or focal lesion. Sinuses/Orbits: Assessed on concurrent face CT, reported separately. Other: None. IMPRESSION: 1. No acute intracranial abnormality. No skull fracture. 2. Age related atrophy. Electronically Signed   By: Narda Rutherford M.D.   On: 10/02/2021 21:25   DG Pelvis 1-2 Views  Result Date: 10/02/2021 CLINICAL DATA:  Recent fall with pelvic pain, initial encounter EXAM: PELVIS - 1 VIEW COMPARISON:  None Available. FINDINGS: Pelvic ring is intact. No acute fracture or dislocation is noted. Scattered fecal material is noted throughout the colon. IMPRESSION: No acute abnormality noted. Electronically Signed   By: Alcide Clever M.D.   On: 10/02/2021 20:18   DG Chest 2 View  Result Date: 10/02/2021 CLINICAL DATA:  Recent fall with chest pain, initial encounter EXAM: CHEST - 2 VIEW COMPARISON:  09/29/2021 FINDINGS: Cardiac shadow is within normal limits. Aortic calcifications are again seen. Old healed right rib fractures are noted. Chronic proximal right humeral fracture. No focal infiltrate or effusion is seen. No acute bony abnormality is noted. IMPRESSION: No acute abnormality noted. Electronically Signed   By: Alcide Clever M.D.   On: 10/02/2021 20:17      Subjective: Patient seen and examined at bedside.  Poor historian.  No agitation, fever, vomiting reported.  Discharge Exam: Vitals:   10/08/21 2003 10/09/21 0445  BP: 112/67 125/60  Pulse: 69 64  Resp: 18 16  Temp: 98.1 F (36.7 C) 97.9 F (36.6 C)  SpO2: 92% 94%    General: Pt is alert, awake, not in acute distress.  Currently on room air.  Looks chronically ill and deconditioned.  Poor historian.  Slow to respond. Cardiovascular: rate controlled, S1/S2 + Respiratory: bilateral decreased breath sounds at  bases Abdominal: Soft, NT, ND, bowel sounds + Extremities: Mild lower extremity edema; no cyanosis    The results of significant diagnostics from this hospitalization (including imaging, microbiology, ancillary and laboratory) are listed below for reference.     Microbiology: Recent Results (from the past 240 hour(s))  Urine Culture     Status: Abnormal   Collection Time: 10/06/21  5:34 AM   Specimen: Urine, Clean Catch  Result Value Ref Range Status   Specimen Description   Final    URINE, CLEAN CATCH Performed at [facility], [address], [place], [place] [address]    Special Requests   Final    NONE Performed at [facility], [address] [place], [place] [address]    Culture >=100,000 COLONIES/mL ESCHERICHIA COLI (A)  Final   Report Status 10/08/2021 FINAL  Final   Organism ID, Bacteria ESCHERICHIA COLI (A)  Final      Susceptibility   Escherichia coli - MIC*    AMPICILLIN >=32 RESISTANT Resistant     CEFAZOLIN 16 SENSITIVE Sensitive     CEFEPIME <=0.12 SENSITIVE Sensitive     CEFTRIAXONE <=0.25 SENSITIVE Sensitive     CIPROFLOXACIN <=0.25 SENSITIVE Sensitive     GENTAMICIN <=1 SENSITIVE Sensitive     IMIPENEM <=0.25 SENSITIVE Sensitive     NITROFURANTOIN <=16 SENSITIVE Sensitive     TRIMETH/SULFA <=20 SENSITIVE Sensitive     AMPICILLIN/SULBACTAM >=32 RESISTANT Resistant     PIP/TAZO <=4 SENSITIVE Sensitive     * >=100,000 COLONIES/mL ESCHERICHIA COLI     Labs: BNP (last 3 results) No results for input(s): "BNP" in the last 8760 hours. Basic Metabolic Panel: Recent Labs  Lab 10/02/21 1943 10/05/21 2314 10/06/21  7846 10/07/21 0544 10/08/21 0558  NA 140 138 142 141 142  K 3.5 3.3* 3.4* 3.9 3.5  CL 111 107 114* 111 113*  CO2 20* 24 22 23 22   GLUCOSE 103* 108* 94 94 94  BUN 25* 37* 25* 23 18  CREATININE 0.83 0.78 0.64 0.55 0.49  CALCIUM 8.9 8.7* 8.0* 8.7* 8.2*  MG  --   --  2.1 2.2 2.1  PHOS  --   --  2.8 3.1  3.0   Liver Function Tests: Recent Labs  Lab 10/02/21 1943 10/05/21 2314 10/06/21 0816 10/07/21 0544 10/08/21 0558  AST 18 16 14* 13* 12*  ALT 11 7 6 6 10   ALKPHOS 43 41 38 40 40  BILITOT 1.2 1.5* 1.3* 1.6* 1.2  PROT 6.3* 6.1* 5.9* 6.3* 5.7*  ALBUMIN 3.8 3.7 3.7 3.8 3.3*   Recent Labs  Lab 10/02/21 1943 10/05/21 2314  LIPASE 36 33   No results for input(s): "AMMONIA" in the last 168 hours. CBC: Recent Labs  Lab 10/02/21 1943 10/05/21 2314 10/06/21 0816 10/07/21 0544 10/08/21 0558  WBC 11.5* 8.3 6.9 6.5 5.5  NEUTROABS 7.1  --  3.7 3.2 2.4  HGB 14.0 12.9 13.2 13.0 12.1  HCT 39.7 37.1 38.0 37.6 35.2*  MCV 86.3 88.8 88.8 89.5 90.0  PLT 190 182 190 178 145*   Cardiac Enzymes: No results for input(s): "CKTOTAL", "CKMB", "CKMBINDEX", "TROPONINI" in the last 168 hours. BNP: Invalid input(s): "POCBNP" CBG: Recent Labs  Lab 10/07/21 0544 10/08/21 0423  GLUCAP 99 107*   D-Dimer No results for input(s): "DDIMER" in the last 72 hours. Hgb A1c No results for input(s): "HGBA1C" in the last 72 hours. Lipid Profile No results for input(s): "CHOL", "HDL", "LDLCALC", "TRIG", "CHOLHDL", "LDLDIRECT" in the last 72 hours. Thyroid function studies No results for input(s): "TSH", "T4TOTAL", "T3FREE", "THYROIDAB" in the last 72 hours.  Invalid input(s): "FREET3" Anemia work up No results for input(s): "VITAMINB12", "FOLATE", "FERRITIN", "TIBC", "IRON", "RETICCTPCT" in the last 72 hours. Urinalysis    Component Value Date/Time   COLORURINE YELLOW 10/06/2021 0117   APPEARANCEUR CLEAR 10/06/2021 0117   LABSPEC 1.006 10/06/2021 0117   PHURINE 6.0 10/06/2021 0117   GLUCOSEU NEGATIVE 10/06/2021 0117   HGBUR NEGATIVE 10/06/2021 0117   BILIRUBINUR NEGATIVE 10/06/2021 0117   KETONESUR 5 (A) 10/06/2021 0117   PROTEINUR NEGATIVE 10/06/2021 0117   NITRITE POSITIVE (A) 10/06/2021 0117   LEUKOCYTESUR SMALL (A) 10/06/2021 0117   Sepsis Labs Recent Labs  Lab 10/05/21 2314  10/06/21 0816 10/07/21 0544 10/08/21 0558  WBC 8.3 6.9 6.5 5.5   Microbiology Recent Results (from the past 240 hour(s))  Urine Culture     Status: Abnormal   Collection Time: 10/06/21  5:34 AM   Specimen: Urine, Clean Catch  Result Value Ref Range Status   Specimen Description   Final    URINE, CLEAN CATCH Performed at [facility], [address], [place], [place]    Special Requests   Final    NONE Performed at [facility], [address], [place], [place]    Culture >=100,000 COLONIES/mL ESCHERICHIA COLI (A)  Final   Report Status 10/08/2021 FINAL  Final   Organism ID, Bacteria ESCHERICHIA COLI (A)  Final      Susceptibility   Escherichia coli - MIC*    AMPICILLIN >=32 RESISTANT Resistant     CEFAZOLIN 16 SENSITIVE Sensitive     CEFEPIME <=0.12 SENSITIVE Sensitive  CEFTRIAXONE <=0.25 SENSITIVE Sensitive     CIPROFLOXACIN <=0.25 SENSITIVE Sensitive     GENTAMICIN <=1 SENSITIVE Sensitive     IMIPENEM <=0.25 SENSITIVE Sensitive     NITROFURANTOIN <=16 SENSITIVE Sensitive     TRIMETH/SULFA <=20 SENSITIVE Sensitive     AMPICILLIN/SULBACTAM >=32 RESISTANT Resistant     PIP/TAZO <=4 SENSITIVE Sensitive     * >=100,000 COLONIES/mL ESCHERICHIA COLI     Time coordinating discharge: 35 minutes  SIGNED:   Glade Lloyd, MD  [facility] 10/09/2021, 8:30 AM

## 2021-10-10 NOTE — TOC Transition Note (Signed)
Transition of Care [facility]) - CM/SW Discharge Note   Patient Details  Name: Tami Vega MRN: [id number] Date of Birth: 26-Dec-1940  Transition of Care [facility]) CM/SW Contact:  Otelia Santee, LCSW Phone Number: 10/10/2021, 2:57 PM   Clinical Narrative:    Pt's insurance has not approved SNF. Pt is to discharge home with HHPT/OT through [facility]. No DME needs identified. Pt's son to provide transportation home for pt.    Final next level of care: Home w Home Health Services Barriers to Discharge: Continued Medical Work up   Patient Goals and CMS Choice Patient states their goals for this hospitalization and ongoing recovery are:: To return home   Choice offered to / list presented to : Adult Children  Discharge Placement                       Discharge Plan and Services In-house Referral: Clinical Social Work Discharge Planning Services: CM Consult Post Acute Care Choice: Skilled Nursing Facility          DME Arranged: N/A DME Agency: NA       HH Arranged: PT, OT HH Agency: [facility] Date HH Agency Contacted: 10/10/21 Time HH Agency Contacted: [id number] Representative spoke with at [facility] Agency: Vangie Bicker  Social Determinants of Health (SDOH) Interventions     Readmission Risk Interventions    10/10/2021    2:16 PM  Readmission Risk Prevention Plan  Post Dischage Appt Complete  Medication Screening Complete  Transportation Screening Complete

## 2021-10-10 NOTE — Care Management Important Message (Signed)
Important Message  Patient Details  Name: Tami Vega MRN: [id number] Date of Birth: Oct 05, 1940   Medicare Important Message Given:  Yes     Mardene Sayer 10/10/2021, 1:34 PM

## 2021-10-10 NOTE — Plan of Care (Signed)

## 2021-10-10 NOTE — Progress Notes (Signed)
Patient is waiting to be discharged to SNF.  Patient seen and examined at bedside.  She is currently medically stable for discharge.  Please refer to the full discharge summary done by me on 10/09/2021 for full details.

## 2021-10-10 NOTE — TOC Progression Note (Addendum)
Transition of Care [facility]) - Progression Note    Patient Details  Name: Tami Vega MRN: [id number] Date of Birth: 02-22-1941  Transition of Care [facility]) CM/SW Contact  Otelia Santee, LCSW Phone Number: 10/10/2021, 9:11 AM  Clinical Narrative:    Pt's insurance was denied for SNF placement. CSW spoke with pt's son to discuss appealing denial vs pt discharging home with [facility] services. Pt's son has concerns due to pt falling twice and not being able to ambulate with PT the day before. Pt's son wants to appeal insurance denial to attempt to get pt to SNF. CSW provided son with case number and phone number for appeal and encouraged him to have this done as soon as he is able.  Update 1400: Pt's son completed appeal and had his mothers PCP call insurance company as well. Mr. Brookshire states that the insurance denied due to recommendation of Skilled Nursing vs Inpatient Rehab. CSW explained that pt was recommended for SNF and not CIR. CSW shared that due to insurance denying SNF that [facility] will need to be explored. Pt's son apprehensive about pt returning home as she has had falls and was unable to ambulate with PT day before.    Expected Discharge Plan: Skilled Nursing Facility Barriers to Discharge: Continued Medical Work up  Expected Discharge Plan and Services Expected Discharge Plan: Skilled Nursing Facility In-house Referral: Clinical Social Work Discharge Planning Services: CM Consult Post Acute Care Choice: Skilled Nursing Facility Living arrangements for the past 2 months: Single Family Home Expected Discharge Date: 10/09/21               DME Arranged: N/A DME Agency: NA                   Social Determinants of Health (SDOH) Interventions    Readmission Risk Interventions     No data to display

## 2021-10-10 NOTE — Plan of Care (Signed)
  Problem: Education: Goal: Knowledge of General Education information will improve Description: Including pain rating scale, medication(s)/side effects and non-pharmacologic comfort measures 10/10/2021 1631 by Odessa Fleming, RN Outcome: Completed/Met 10/10/2021 1425 by Odessa Fleming, RN Outcome: Adequate for Discharge   Problem: Health Behavior/Discharge Planning: Goal: Ability to manage health-related needs will improve 10/10/2021 1631 by Odessa Fleming, RN Outcome: Completed/Met 10/10/2021 1425 by Odessa Fleming, RN Outcome: Adequate for Discharge   Problem: Clinical Measurements: Goal: Ability to maintain clinical measurements within normal limits will improve 10/10/2021 1631 by Odessa Fleming, RN Outcome: Completed/Met 10/10/2021 1425 by Odessa Fleming, RN Outcome: Adequate for Discharge Goal: Will remain free from infection 10/10/2021 1631 by Odessa Fleming, RN Outcome: Completed/Met 10/10/2021 1425 by Odessa Fleming, RN Outcome: Adequate for Discharge Goal: Diagnostic test results will improve 10/10/2021 1631 by Odessa Fleming, RN Outcome: Completed/Met 10/10/2021 1425 by Odessa Fleming, RN Outcome: Adequate for Discharge Goal: Respiratory complications will improve 10/10/2021 1631 by Odessa Fleming, RN Outcome: Completed/Met 10/10/2021 1425 by Odessa Fleming, RN Outcome: Adequate for Discharge Goal: Cardiovascular complication will be avoided 10/10/2021 1631 by Odessa Fleming, RN Outcome: Completed/Met 10/10/2021 1425 by Odessa Fleming, RN Outcome: Adequate for Discharge   Problem: Activity: Goal: Risk for activity intolerance will decrease 10/10/2021 1631 by Odessa Fleming, RN Outcome: Completed/Met 10/10/2021 1425 by Rosemary Holms, Veatrice Bourbon, RN Outcome: Adequate for Discharge   Problem: Nutrition: Goal: Adequate nutrition will be  maintained 10/10/2021 1631 by Odessa Fleming, RN Outcome: Completed/Met 10/10/2021 1425 by Odessa Fleming, RN Outcome: Adequate for Discharge   Problem: Coping: Goal: Level of anxiety will decrease 10/10/2021 1631 by Odessa Fleming, RN Outcome: Completed/Met 10/10/2021 1425 by Odessa Fleming, RN Outcome: Adequate for Discharge   Problem: Elimination: Goal: Will not experience complications related to bowel motility 10/10/2021 1631 by Odessa Fleming, RN Outcome: Completed/Met 10/10/2021 1425 by Odessa Fleming, RN Outcome: Adequate for Discharge Goal: Will not experience complications related to urinary retention 10/10/2021 1631 by Odessa Fleming, RN Outcome: Completed/Met 10/10/2021 1425 by Odessa Fleming, RN Outcome: Adequate for Discharge   Problem: Pain Managment: Goal: General experience of comfort will improve 10/10/2021 1631 by Odessa Fleming, RN Outcome: Completed/Met 10/10/2021 1425 by Odessa Fleming, RN Outcome: Adequate for Discharge   Problem: Safety: Goal: Ability to remain free from injury will improve 10/10/2021 1631 by Odessa Fleming, RN Outcome: Completed/Met 10/10/2021 1425 by Odessa Fleming, RN Outcome: Adequate for Discharge   Problem: Skin Integrity: Goal: Risk for impaired skin integrity will decrease 10/10/2021 1631 by Odessa Fleming, RN Outcome: Completed/Met 10/10/2021 1425 by Odessa Fleming, RN Outcome: Adequate for Discharge

## 2021-10-13 DIAGNOSIS — N390 Urinary tract infection, site not specified: Secondary | ICD-10-CM | POA: Diagnosis not present

## 2021-10-13 DIAGNOSIS — G20 Parkinson's disease: Secondary | ICD-10-CM | POA: Diagnosis not present

## 2021-10-13 DIAGNOSIS — I10 Essential (primary) hypertension: Secondary | ICD-10-CM | POA: Diagnosis not present

## 2021-10-13 DIAGNOSIS — I5032 Chronic diastolic (congestive) heart failure: Secondary | ICD-10-CM | POA: Diagnosis not present

## 2021-10-13 DIAGNOSIS — B9620 Unspecified Escherichia coli [E. coli] as the cause of diseases classified elsewhere: Secondary | ICD-10-CM | POA: Diagnosis not present

## 2021-10-13 DIAGNOSIS — E039 Hypothyroidism, unspecified: Secondary | ICD-10-CM | POA: Diagnosis not present

## 2021-10-13 DIAGNOSIS — I951 Orthostatic hypotension: Secondary | ICD-10-CM | POA: Diagnosis not present

## 2021-10-13 DIAGNOSIS — R69 Illness, unspecified: Secondary | ICD-10-CM | POA: Diagnosis not present

## 2021-10-15 DIAGNOSIS — N390 Urinary tract infection, site not specified: Secondary | ICD-10-CM | POA: Diagnosis not present

## 2021-10-15 DIAGNOSIS — E039 Hypothyroidism, unspecified: Secondary | ICD-10-CM | POA: Diagnosis not present

## 2021-10-15 DIAGNOSIS — B9620 Unspecified Escherichia coli [E. coli] as the cause of diseases classified elsewhere: Secondary | ICD-10-CM | POA: Diagnosis not present

## 2021-10-15 DIAGNOSIS — R69 Illness, unspecified: Secondary | ICD-10-CM | POA: Diagnosis not present

## 2021-10-15 DIAGNOSIS — G20 Parkinson's disease: Secondary | ICD-10-CM | POA: Diagnosis not present

## 2021-10-15 DIAGNOSIS — I951 Orthostatic hypotension: Secondary | ICD-10-CM | POA: Diagnosis not present

## 2021-10-15 DIAGNOSIS — I5032 Chronic diastolic (congestive) heart failure: Secondary | ICD-10-CM | POA: Diagnosis not present

## 2021-10-17 DIAGNOSIS — I5032 Chronic diastolic (congestive) heart failure: Secondary | ICD-10-CM | POA: Diagnosis not present

## 2021-10-17 DIAGNOSIS — B9620 Unspecified Escherichia coli [E. coli] as the cause of diseases classified elsewhere: Secondary | ICD-10-CM | POA: Diagnosis not present

## 2021-10-17 DIAGNOSIS — E039 Hypothyroidism, unspecified: Secondary | ICD-10-CM | POA: Diagnosis not present

## 2021-10-17 DIAGNOSIS — I951 Orthostatic hypotension: Secondary | ICD-10-CM | POA: Diagnosis not present

## 2021-10-17 DIAGNOSIS — G20 Parkinson's disease: Secondary | ICD-10-CM | POA: Diagnosis not present

## 2021-10-17 DIAGNOSIS — N390 Urinary tract infection, site not specified: Secondary | ICD-10-CM | POA: Diagnosis not present

## 2021-10-17 DIAGNOSIS — R69 Illness, unspecified: Secondary | ICD-10-CM | POA: Diagnosis not present

## 2021-10-20 ENCOUNTER — Telehealth: Payer: Self-pay | Admitting: Family Medicine

## 2021-10-20 DIAGNOSIS — R69 Illness, unspecified: Secondary | ICD-10-CM | POA: Diagnosis not present

## 2021-10-20 DIAGNOSIS — G20 Parkinson's disease: Secondary | ICD-10-CM | POA: Diagnosis not present

## 2021-10-20 DIAGNOSIS — I5032 Chronic diastolic (congestive) heart failure: Secondary | ICD-10-CM | POA: Diagnosis not present

## 2021-10-20 DIAGNOSIS — E039 Hypothyroidism, unspecified: Secondary | ICD-10-CM | POA: Diagnosis not present

## 2021-10-20 DIAGNOSIS — B9620 Unspecified Escherichia coli [E. coli] as the cause of diseases classified elsewhere: Secondary | ICD-10-CM | POA: Diagnosis not present

## 2021-10-20 DIAGNOSIS — N390 Urinary tract infection, site not specified: Secondary | ICD-10-CM | POA: Diagnosis not present

## 2021-10-20 DIAGNOSIS — I951 Orthostatic hypotension: Secondary | ICD-10-CM | POA: Diagnosis not present

## 2021-10-20 NOTE — Telephone Encounter (Signed)
I called the son. He is referring to the medication that was called in at the last ov in may. The Parcopa 1/2 tablet BID. He states that they have checked around at multiple pharmacies and there is no one that has that in stock. She is still currently taking the carbidopa/levodopa IR form 25/100- 5 times a day.  Advised I would inform Amy of this and see what her recommendation would be for alternative.

## 2021-10-20 NOTE — Telephone Encounter (Signed)
I have called and spoke with someone at the [facility] who states they can order from the manufacture and as long as they have it, it should deliver tomorrow. I have asked that if it doesn't come in that the pharmacy notify us.  I called the son and this is the same message he has received 3 different times. He states [facility] they tell him that they cant find it in a 20 mile radius. The son will call us and update [place] if they don't get it tomorrow. The only other place in their area is [facility] in [place].

## 2021-10-20 NOTE — Telephone Encounter (Signed)
Pt's son, Allaya Abbasi said pharmacy do not have the prescription for Carbidopa-Levodopa extended release.  Would  like a call from the nurse.

## 2021-10-21 DIAGNOSIS — B351 Tinea unguium: Secondary | ICD-10-CM | POA: Diagnosis not present

## 2021-10-23 ENCOUNTER — Ambulatory Visit: Payer: Medicare HMO | Admitting: Neurology

## 2021-10-23 ENCOUNTER — Encounter: Payer: Self-pay | Admitting: Neurology

## 2021-10-23 VITALS — BP 116/66 | HR 74 | Ht 67.0 in | Wt 155.8 lb

## 2021-10-23 DIAGNOSIS — Z9181 History of falling: Secondary | ICD-10-CM

## 2021-10-23 DIAGNOSIS — G20 Parkinson's disease: Secondary | ICD-10-CM | POA: Diagnosis not present

## 2021-10-23 DIAGNOSIS — I951 Orthostatic hypotension: Secondary | ICD-10-CM

## 2021-10-23 DIAGNOSIS — R55 Syncope and collapse: Secondary | ICD-10-CM | POA: Diagnosis not present

## 2021-10-23 DIAGNOSIS — G249 Dystonia, unspecified: Secondary | ICD-10-CM | POA: Diagnosis not present

## 2021-10-23 MED ORDER — CARBIDOPA-LEVODOPA 25-100 MG PO TABS: 1.0000 | ORAL_TABLET | Freq: Every day | ORAL | 3 refills | Status: AC

## 2021-10-23 NOTE — Patient Instructions (Addendum)
It was nice to see you again today.  I am sorry to hear that you were in the hospital for a passing out spell and after falling.  Here is what we discussed today our plan for you:   Unfortunately, dizziness and lightheadedness as well as low blood pressure values are a common complaints in Parkinson's patients.  It is often due to a combination of factors, including blood pressure dysregulation because of her underlying Parkinson's disease, low blood pressure because of the dehydration or suboptimal hydration, and medication side effects. Please try to average at least 3 if not 4 16.9 oz bottles of water per day, spread out over the course of the day. Stand up slowly and get your bearings first, and use your walker at all times. Try to use compression socks, knee-highs, you can find these at a regular department store, online, medical supply store or even a pharmacy. We will taper you off the entacapone, please reduce to 1 pill twice daily 3 days, then 1 pill once daily for 3 days and then stop altogether.  I will not renew this medication for now. We will increase your carbidopa-levodopa to 1 pill 6 times a day, and take the early morning dose around 4 AM, then 7 AM, 10 AM, 1 PM, 4 PM and 7 PM daily. Follow-up in about 3 months to see Shawnie Dapper, NP.

## 2021-10-23 NOTE — Progress Notes (Signed)
Subjective:    Patient ID: Tami Vega is a 81 y.o. female.  HPI    Interim history:   Tami Vega is a very pleasant 81 year old right-handed woman with an underlying medical history of hyperlipidemia, history of pituitary microadenoma, precancerous breast lesions status post double mastectomy in the 1980s, who presents for follow-up consultation of her left-sided predominant Parkinson's disease. The patient is accompanied by her sister today. I last saw her on 04/15/2021, at which time she was advised to continue with Sinemet 5 times a day, entacapone 3 times a day and Azilect once daily.  She was on Lexapro 10 mg daily.  She was advised to follow-up with her primary care to optimize treatment for anxiety and stress.  She saw Tami Presto, NP in the interim on 08/20/2021, at which time she was advised to add Parcopa half a tablet up to twice daily as needed for additional help with her regular levodopa wearing off.  Today, 10/23/2021: She reports doing a little better.  She had a recent follow-up with her primary care physician on 10/15/2021 and I reviewed the office note.  She was found to have orthostatic hypotension at the time.  She was encouraged to drink more water.  She is indicated that she is trying to drink more water but sister believes that she may not always drink enough.  Patient estimates that she averages about 2 bottles of water per day, 16.9 ounce size.  She is getting home health PT but this week at least once she was not able to participate as her physical therapist reported that her blood pressure was too low, they are not sure as to the exact number.  Her son, Tami Vega who lives with them may be moving out at some point soon, no exact date.  Her other son, Tami Vega is power of attorney.  She also has a daughter who lives about 15 minutes away.  She takes her Sinemet 5 times a day starting at 7 AM, 3 hourly.  She goes to bed between 7 and 8.  She is not sleeping well through the night  and often is awake in the early morning hours and lays in bed but cannot go back to sleep.  She went to the emergency room on 10/02/2021 after a fall in the bathroom.  She sustained a laceration inside the mouth.  I reviewed the emergency room records.  She was found to have low blood pressure and received IV fluids.  She had a head CT and cervical spine CT without contrast as well as maxillofacial CT without contrast on 10/02/2021 and I reviewed the results:  IMPRESSION: 1. No acute intracranial abnormality. No skull fracture. 2. Age related atrophy.   1. No acute fracture or subluxation of the cervical spine. 2. Mild multilevel degenerative disc disease and facet hypertrophy.     IMPRESSION: 1. Soft tissue laceration over the left chin with small hematoma. No acute facial bone fracture. 2. Unchanged partially calcified soft tissue density or small mass in the right superior nasal passage. This is stable dating back to 2021 head CT. As clinically indicated, consider ENT evaluation.     She was seen in the emergency room at [facility] on 10/05/2021 after a syncopal event. She was admitted to the hospital.  I reviewed the hospital records, she was discharged on 10/09/2021.  She had a head CT without contrast and cervical spine CT without contrast on 10/06/2021 and I reviewed the results: IMPRESSION: CT of the  head: Chronic atrophic changes are again identified. No acute intracranial abnormality noted.   New left parietal scalp hematoma near the vertex.   CT of the cervical spine: Multilevel degenerative change without acute abnormality. The overall appearance is stable from the prior CT.  She had orthostatic hypotension, she was treated symptomatically with fluids.  She was treated for a E. coli UTI.  She was initially on Rocephin and switched to cefdinir.  She had hypokalemia, physical deconditioning, leukocytosis.  She was discharged to a skilled nursing facility.  T  The patient's  allergies, current medications, family history, past medical history, past social history, past surgical history and problem list were reviewed and updated as appropriate.    Previously (copied from previous notes for reference):      I saw her on 06/10/2020, at which time she reported a recent fall.  She did hit her head.  She was not hydrating as well as she should and had been told by her primary care that she was dehydrated.  She reported ongoing stress and worry about her husband.  She was in home health physical therapy.  She was advised to continue with Sinemet 1 pill 5 times a day, Azilect once daily and entacapone 1 pill 3 times a day.  She was advised to hydrate better.  Since she had sustained a fall with head injury, I advised her to get a head CT done.  She had a normal head CT through [facility] in [place] on 06/13/2020 and I reviewed the results: Impression: Mildly motion degraded exam.  No evidence of acute intracranial abnormality.  Left forehead soft tissue swelling.  Mild cerebral atrophy.  1.3 x 0.9 cm nonspecific polypoid soft tissue fullness expanding the right sphenoethmoidal recess, stable as compared to the head CT of 11/02/2019.  Consider ENT consultation as clinically warranted.   She saw Tami Presto, NP in the interim on 10/14/2020, at which time she reported no recent falls.  She was advised to continue with her medication regimen.     I saw her on 02/12/2020, at which time we talked about the importance of fall prevention.  She had fallen in August 2021 and broke her proximal right humerus.  She was treated conservatively.  She was in rehab and subsequently had home health therapy.  She has ongoing stress.  She was advised to continue her Parkinson's medications.     I saw her on 10/10/2019, at which time she reported that her Sinemet was not lasting as long.  She was taking 1 pill 4 times a day.  She also had more stress what with her husband's declining health and memory  loss.      I saw her on 04/12/2019, at which time she reported tolerating Comtan.  We mutually agreed to increase it to 1 pill 3 times daily.  She was advised to continue with Sinemet and Azilect and we talked about potentially utilizing inbrija potentially down the road.         I saw her on 12/07/2018, at which time she reported taking Comtan only once daily at 11 AM.  She felt that she was too sedated when she took it twice daily but she was willing to try it again.  I suggest that she added for the 3 PM dose.  We kept her Sinemet the same as well as the Azilect.          I saw her on 09/05/2018, at which time she reported feeling more  slower, she had had a few times of feeling lightheaded upon standing up too quickly.  She had a fall in the kitchen.  She was taking Sinemet 4 times a day.  She was more fatigued by the end of the day.  Constipation was under control.  She had had some stable hallucinations, nothing bothersome.  I suggested a cautious addition of Comtan for 2 of her 4 doses of Sinemet.  She called back in July 2020 indicating that she felt more unsteady since starting the Comtan and she was advised to reduce the Comtan to only once daily with 1 of her Sinemet doses.   I saw her on 03/02/2018, at which time she reported that she was not able to tolerate the Sinemet long-acting.  She had problems with insomnia, she was no longer on it.  We had started it in or around June 2019.  She was generally taking Sinemet 4 times a day.  She was having more difficulty in the mornings in terms of mobility.  She was advised to take a middle of the night dose of Sinemet for total of 5 pills/day, the middle of the night dose anywhere between 11 PM and 4 AM.  We talked about the importance of monitoring her driving.  She had limited her driving.     I saw her on 08/30/2017, at which time she reported having had one fall in the past 6 months. She fell in the kitchen. She unfortunately cracked a couple of  ribs, had an x-ray through PCP. She noticed that she was more stiffer in the mornings and had difficulty moving at times, shuffling more. We talked about the importance of fall prevention and the importance of being proactive about constipation. She was advised to hydrate well with water and continue with Sinemet 1 pill 4 times a day at 7, 11, 3 and 7. I asked her to add Sinemet CR 50-200 milligrams strength at bedtime. She was advised to continue with Azilect once daily as well.   She called a few days after that in the interim reporting that she felt more sleepy after starting the Sinemet CR. I suggested we could reduce it to 25-100 milligrams strength long-acting but she declined.   I saw her on 02/22/17, at which time she reported doing okay, sometimes she felt lightheaded. She had had a few falls, with a tendency to fall backwards when she stood up too quickly. For her occasional constipation she was using MiraLAX as needed about twice a week. I advised her to continue her current Parkinson's disease medication regimen including Sinemet 1 pill 4 times a day and Azilect 1 pill once daily, talked about fall risk and gait safety.   I saw her on 08/18/2016, at which time she reported increase in tremor on the left side. She had some difficulty with sleep at times. She had not tried any over-the-counter medication. She had a fall about 3 months prior in the yard. Thankfully she did not sustain any major injuries. She did bruise her right thigh. I suggested she try to increase her Sinemet to 1 pill 4 times a day and keep her Azilect at 1 mg once daily. She was advised to try melatonin for sleep.   I saw her on 02/19/2016, at which time she reported overall doing quite well, she was active, sewing and making crafts. She had no recent falls but had noticed lightheadedness particularly upon standing up quickly. She had a near fall experience recently at  a restaurant. She was not always hydrating well. Mood and  memory were stable. Motor-wise, she felt stable as well. I suggested we continue with her medication regimen, including Azilect and Sinemet.Marland Kitchen She was furthermore advised to stay better hydrated and change positions slowly, also start using compression stockings.   I saw her on 08/12/2015, at which time she reported feeling fairly stable. She was going to the gym about 4 times a week, had lost weight, mostly because of change in taste/lack of taste. Also, she cut out sodas and has been watching what she eats, trying to drink more water. Overall, motor-wise she felt stable, had mild short-term memory issues but no mood or sinister memory issues, no recent falls. Sometimes her left hand tremor was worse. She has 3 grown children and 3 grandchildren. She was on low-dose Lexapro. I suggested she continue with Azilect once daily and Sinemet 1 pill 3 times a day.   I saw her on 02/11/2015, at which time she reported doing better with the recent addition of Sinemet. She felt she had more energy and mobility was better as well. She needed refills on paper prescriptions because of changing her pharmacies. Her daughter reported that she had stopped taking her antidepressant for a couple of months but her family had noticed more problems with her depression and anxiety and she restarted her medication. She was trying to stay active. She is trying to drink enough water. She did have additional stressors lately because her son was involved in a car accident and was still in the hospital at the time. Overall, however, she had done fairly well. We mutually agreed to continue her on Azilect 1 mg once daily and Sinemet 1 pill 3 times a day.   I saw her on 10/16/2014, at which time she reported that her tremor was a little worse. She also felt that her walking was worse. She had a harder time picking up her feet, particularly on the left side. Thankfully she had not fallen. She was sleeping a little better since switching her  antidepressant to nighttime. I asked her to continue with Azilect once daily but also to add a small dose of Sinemet starting with half a pill twice daily with gradual titration to 1 pill 3 times a day.    I saw her on 04/17/2014, at which time she reported doing a little better with regards to her tremor. She noticed a flareup of her tremor with nervousness. She was still able to use her sewing machine. She had no side effects from her medications. She did find out that she had 2 female cousins on her father's side with Parkinson's disease. I asked her to continue Azilect 1 mg once daily.   I first met her on 10/20/2013 at the request of her primary care physician, at which time she gave a 6-12 month history of left upper extremity tremor. She also reported problems with fine motor skills on the left. Her history and physical exam are in keeping with mild parkinsonism, probably left eye predominant Parkinson's disease. I suggested further workup with a brain MRI and starting her on Azilect. She did not have a brain scan done.    She has noted L sided issues with fine motor skills, resting tremor on the LUE, she feels easily fatigued and it is difficult for her to pick up her L leg sometimes. Thankfully, she has not fallen, but her balance is off at times. She goes to [employer] 4 times a  week, but has less stamina. She is a life long non-smoker, does not drink alcohol and has no history of head injury, exposure to pesticides, but did work in a Emergency planning/management officer for 33 years and she [place]. She has no FHx of PD or tremors.     Her Past Medical History Is Significant For: Past Medical History:  Diagnosis Date   Abnormal involuntary movements(781.0)    Disorder of bone and cartilage, unspecified    Elevated blood pressure reading without diagnosis of hypertension    Major depressive disorder, single episode, unspecified    Other musculoskeletal symptoms referable to limbs(729.89)    Pain in joint,  lower leg    Pure hypercholesterolemia     Her Past Surgical History Is Significant For: Past Surgical History:  Procedure Laterality Date   BREAST SURGERY  1980's   benign cyst   COLONOSCOPY  1995, [place]    Her Family History Is Significant For: Family History  Problem Relation Age of Onset   CAD Mother    Breast cancer Mother    CAD Father    Parkinson's disease Father    Tremor Sister    Parkinson's disease Sister    Hyperlipidemia Sister    Hypertension Sister    CAD Brother    Breast cancer Maternal Aunt    Colon cancer Maternal Aunt     Her Social History Is Significant For: Social History   Socioeconomic History   Marital status: Married    Spouse name: Not on file   Number of children: Not on file   Years of education: Not on file   Highest education level: Not on file  Occupational History   Not on file  Tobacco Use   Smoking status: Never   Smokeless tobacco: Never  Substance and Sexual Activity   Alcohol use: No    Alcohol/week: 0.0 standard drinks of alcohol   Drug use: No   Sexual activity: Not on file  Other Topics Concern   Not on file  Social History Narrative   Not on file   Social Determinants of Health   Financial Resource Strain: Not on file  Food Insecurity: Not on file  Transportation Needs: Not on file  Physical Activity: Not on file  Stress: Not on file  Social Connections: Not on file    Her Allergies Are:  No Known Allergies:   Her Current Medications Are:  Outpatient Encounter Medications as of 10/23/2021  Medication Sig   b complex vitamins tablet Take 1 tablet by mouth daily.   Calcium Carbonate-Vit D-Min (CALCIUM 1200 PO) Take 1 tablet by mouth daily. Take one tablet a day   carbidopa-levodopa (SINEMET IR) 25-100 MG tablet Take 1 tablet by mouth 5 (five) times daily. Take at  7 AM, 10 AM, 1 PM, 4 PM and  7 PM   cholecalciferol (VITAMIN D) 25 MCG tablet Take 1 tablet (1,000 Units total) by  mouth daily.   entacapone (COMTAN) 200 MG tablet Take 1 tablet (200 mg total) by mouth 3 (three) times daily. Take with Sinemet dose at 7 AM, 1 PM and 7 PM.   escitalopram (LEXAPRO) 20 MG tablet Take 10 mg by mouth 2 (two) times daily.   fish oil-omega-3 fatty acids 1000 MG capsule Take 1 g by mouth daily.   levothyroxine (SYNTHROID) 75 MCG tablet Take 75 mcg by mouth every morning.   OMEPRAZOLE PO Take 20 mg by mouth  daily.   polyethylene glycol (MIRALAX / GLYCOLAX) 17 g packet Take 17 g by mouth daily as needed for mild constipation.   Probiotic Product (PROBIOTIC PO) Take 1 capsule by mouth daily.   rasagiline (AZILECT) 1 MG TABS tablet Take 1 tablet (1 mg total) by mouth daily.   No facility-administered encounter medications on file as of 10/23/2021.  :  Review of Systems:  Out of a complete 14 point review of systems, all are reviewed and negative with the exception of these symptoms as listed below:   Review of Systems  Neurological:        Pt here for worsening parkinson's   Pt states fell 2 week ago . Pt was in hospital for 2 weeks . Pt states that her BP is low and she was unable to do her PT . Pt states at times she is incontinent.      Objective:  Neurological Exam  Physical Exam Physical Examination:   Vitals:   10/23/21 1029  BP: 116/66  Pulse: 74    General Examination: The patient is a very pleasant 81 y.o. female in no acute distress. She appears frail, well, groomed.   HEENT: Normocephalic, atraumatic, pupils are equal, round and reactive to light. Corrective eyeglasses in place, bilateral cataracts noted. Extraocular tracking shows saccadic breakdown without nystagmus noted.  There is decrease in eye blink rate. Hearing is impaired. Face is symmetric with moderate facial masking and normal facial sensation. There is no lip, neck or jaw tremor. Neck is moderately rigid with decreased ROM and stooped neck.  Healing bruise left posterior neck area.  Oropharynx exam  reveals mild to moderate mouth dryness. No significant airway crowding is noted. Mallampati is class II. Tongue protrudes centrally and palate elevates symmetrically. There is no drooling.    Chest: is clear to auscultation without wheezing, rhonchi or crackles noted.   Heart: sounds are regular and normal without murmurs, rubs or gallops noted.    Abdomen: is soft, non-tender and non-distended.   Extremities: There is no pitting edema in the distal lower extremities bilaterally.    Skin: is warm and dry with no trophic changes noted. Age-related changes are noted on the skin.   Musculoskeletal: exam reveals no obvious joint deformities.   Neurologically:  Mental status: The patient is awake and alert, paying good attention, at times slower to respond, speech is hypophonic, memory mildly impaired, sister also provides some details of her history, mild bradyphrenia.  Slight dysarthria noted at times. Affect normal and stable.  Cranial nerves are as described above under HEENT exam.   Motor exam: Normal bulk, and strength for age is noted. There are mild generalized dyskinesias today. Tone is more rigid with presence of cogwheeling in the left upper extremity. There is overall moderate bradykinesia. There is no drift or rebound.  There is a mild to moderate resting tremor in the left upper extremity. Romberg is not tested d/t safety concern. Fine motor skills exam: mild to moderately impaired on the right and moderate to significantly impaired on the left.   Cerebellar testing shows no dysmetria or intention tremor on finger to nose testing. there is no truncal or gait ataxia.    Sensory exam is intact to light touch in the upper and lower extremities.    Gait, station and balance: She stands up from the seated position with mild difficulty, requires no assistance, she reports mild lightheadedness.  She walks with her walker.  Balance is mildly impaired.  Assessment and Plan:    In  summary, Candi Profit is a very pleasant 81 year old female with an underlying medical history of hyperlipidemia, history of pituitary microadenoma, precancerous breast lesions (with s/p double mastectomy in the 1980s), who presents for follow-up consultation of her left-sided predominant Parkinson's disease with symptoms dating back to about 7-8 years ago. She has had progression over time, significantly more notable progression in the past 2+ years.  She has had falls.  She had a fall in August 2021 and broke her arm.  She has also developed dyskinesias and intermittent mild visual hallucinations, as well as more significant constipation and orthostatic hypotension with a recent syncopal event and 2 recent falls. She has had increase in stress what with her husband's medical issues and memory loss.  She does have a good psychosocial support system and her 2 sons and her sisters are helping out, typically very involved in her care.  She has an older brother and 2 younger sisters.  Her son, Tami Vega, is power of attorney, her son, Tami Vega, lives with them.  She started C/L in July 2016. She is also on Azilect 1 mg once daily, started this in July 2015. Sinemet was increased in 5/18 to 1 pill 4 times a day. She has had a fall with injury to the ribs. For depression and anxiety she has been maintained on Lexapro 10 mg daily, per PCP.  She was advised to increase Comtan from 1 pill twice daily to 1 pill 3 times daily in January 2021.  She has been on Sinemet 1 pill 4 times a day and 1 optional pill in the middle of the night.  We increased her Sinemet to 1 pill 5 times daily in June 2021.  She was advised to continue with Comtan 3 times a day at her 7 AM dose, 1 PM dose and 7 PM dose. We started Comtan in June 2020.  Due to her blood pressure dysregulation she is advised to taper off Comtan at this point.  She will reduce it to 1 pill twice daily for 3 days, then 1 pill once daily for 3 days and then stop.  She is  advised to drink more water on average at least 3 bottles of water per day, she is advised to add 1 early morning dose of levodopa to a total of 6 doses per day 3 hourly starting at 4 AM.  She is advised to start using compression socks up to the knees for maintaining her blood pressure better.  She is advised to stand up slowly and get her bearings first.  She is furthermore encouraged to continue to use her walker at all times and we talked about the importance of fall prevention again today.  She has had several CT scans recently after her falls.  She had a CT head in 2022 after a fall.  I entered a new prescription for Sinemet generic 1 pill 6 times a day, I discontinued the prescription for entacapone.  I provided detailed written instructions and she is advised to follow-up to see Tami Presto, NP in 3 months, sooner if needed. I answered all their questions today and the patient and her sister were in agreement.  I spent 45 minutes in total face-to-face time and in reviewing records during pre-charting, more than 50% of which was spent in counseling and coordination of care, reviewing test results, reviewing medications and treatment regimen and/or in discussing or reviewing the diagnosis of PD, the prognosis and  treatment options. Pertinent laboratory and imaging test results that were available during this visit with the patient were reviewed by me and considered in my medical decision making (see chart for details).

## 2021-10-24 DIAGNOSIS — B9620 Unspecified Escherichia coli [E. coli] as the cause of diseases classified elsewhere: Secondary | ICD-10-CM | POA: Diagnosis not present

## 2021-10-24 DIAGNOSIS — I5032 Chronic diastolic (congestive) heart failure: Secondary | ICD-10-CM | POA: Diagnosis not present

## 2021-10-24 DIAGNOSIS — G20 Parkinson's disease: Secondary | ICD-10-CM | POA: Diagnosis not present

## 2021-10-24 DIAGNOSIS — N390 Urinary tract infection, site not specified: Secondary | ICD-10-CM | POA: Diagnosis not present

## 2021-10-24 DIAGNOSIS — E039 Hypothyroidism, unspecified: Secondary | ICD-10-CM | POA: Diagnosis not present

## 2021-10-24 DIAGNOSIS — I951 Orthostatic hypotension: Secondary | ICD-10-CM | POA: Diagnosis not present

## 2021-10-24 DIAGNOSIS — R69 Illness, unspecified: Secondary | ICD-10-CM | POA: Diagnosis not present

## 2021-10-27 DIAGNOSIS — R69 Illness, unspecified: Secondary | ICD-10-CM | POA: Diagnosis not present

## 2021-10-27 DIAGNOSIS — I5032 Chronic diastolic (congestive) heart failure: Secondary | ICD-10-CM | POA: Diagnosis not present

## 2021-10-27 DIAGNOSIS — B9620 Unspecified Escherichia coli [E. coli] as the cause of diseases classified elsewhere: Secondary | ICD-10-CM | POA: Diagnosis not present

## 2021-10-27 DIAGNOSIS — G20 Parkinson's disease: Secondary | ICD-10-CM | POA: Diagnosis not present

## 2021-10-27 DIAGNOSIS — E039 Hypothyroidism, unspecified: Secondary | ICD-10-CM | POA: Diagnosis not present

## 2021-10-27 DIAGNOSIS — N390 Urinary tract infection, site not specified: Secondary | ICD-10-CM | POA: Diagnosis not present

## 2021-10-27 DIAGNOSIS — I951 Orthostatic hypotension: Secondary | ICD-10-CM | POA: Diagnosis not present

## 2021-10-29 ENCOUNTER — Encounter: Payer: Self-pay | Admitting: Family Medicine

## 2021-10-29 DIAGNOSIS — I951 Orthostatic hypotension: Secondary | ICD-10-CM | POA: Diagnosis not present

## 2021-10-29 DIAGNOSIS — G20 Parkinson's disease: Secondary | ICD-10-CM | POA: Diagnosis not present

## 2021-10-29 DIAGNOSIS — N390 Urinary tract infection, site not specified: Secondary | ICD-10-CM | POA: Diagnosis not present

## 2021-10-29 DIAGNOSIS — B9620 Unspecified Escherichia coli [E. coli] as the cause of diseases classified elsewhere: Secondary | ICD-10-CM | POA: Diagnosis not present

## 2021-10-29 DIAGNOSIS — I5032 Chronic diastolic (congestive) heart failure: Secondary | ICD-10-CM | POA: Diagnosis not present

## 2021-10-29 DIAGNOSIS — E039 Hypothyroidism, unspecified: Secondary | ICD-10-CM | POA: Diagnosis not present

## 2021-10-29 DIAGNOSIS — R69 Illness, unspecified: Secondary | ICD-10-CM | POA: Diagnosis not present

## 2021-10-30 DIAGNOSIS — B9620 Unspecified Escherichia coli [E. coli] as the cause of diseases classified elsewhere: Secondary | ICD-10-CM | POA: Diagnosis not present

## 2021-10-30 DIAGNOSIS — I5032 Chronic diastolic (congestive) heart failure: Secondary | ICD-10-CM | POA: Diagnosis not present

## 2021-10-30 DIAGNOSIS — I951 Orthostatic hypotension: Secondary | ICD-10-CM | POA: Diagnosis not present

## 2021-10-30 DIAGNOSIS — G20 Parkinson's disease: Secondary | ICD-10-CM | POA: Diagnosis not present

## 2021-10-30 DIAGNOSIS — E039 Hypothyroidism, unspecified: Secondary | ICD-10-CM | POA: Diagnosis not present

## 2021-10-30 DIAGNOSIS — N390 Urinary tract infection, site not specified: Secondary | ICD-10-CM | POA: Diagnosis not present

## 2021-10-30 DIAGNOSIS — R69 Illness, unspecified: Secondary | ICD-10-CM | POA: Diagnosis not present

## 2021-11-04 DIAGNOSIS — I951 Orthostatic hypotension: Secondary | ICD-10-CM | POA: Diagnosis not present

## 2021-11-04 DIAGNOSIS — N390 Urinary tract infection, site not specified: Secondary | ICD-10-CM | POA: Diagnosis not present

## 2021-11-04 DIAGNOSIS — B9620 Unspecified Escherichia coli [E. coli] as the cause of diseases classified elsewhere: Secondary | ICD-10-CM | POA: Diagnosis not present

## 2021-11-04 DIAGNOSIS — R69 Illness, unspecified: Secondary | ICD-10-CM | POA: Diagnosis not present

## 2021-11-04 DIAGNOSIS — I5032 Chronic diastolic (congestive) heart failure: Secondary | ICD-10-CM | POA: Diagnosis not present

## 2021-11-04 DIAGNOSIS — G20 Parkinson's disease: Secondary | ICD-10-CM | POA: Diagnosis not present

## 2021-11-04 DIAGNOSIS — E039 Hypothyroidism, unspecified: Secondary | ICD-10-CM | POA: Diagnosis not present

## 2021-11-11 DIAGNOSIS — I5032 Chronic diastolic (congestive) heart failure: Secondary | ICD-10-CM | POA: Diagnosis not present

## 2021-11-11 DIAGNOSIS — E039 Hypothyroidism, unspecified: Secondary | ICD-10-CM | POA: Diagnosis not present

## 2021-11-11 DIAGNOSIS — G20 Parkinson's disease: Secondary | ICD-10-CM | POA: Diagnosis not present

## 2021-11-11 DIAGNOSIS — N390 Urinary tract infection, site not specified: Secondary | ICD-10-CM | POA: Diagnosis not present

## 2021-11-11 DIAGNOSIS — R69 Illness, unspecified: Secondary | ICD-10-CM | POA: Diagnosis not present

## 2021-11-11 DIAGNOSIS — B9620 Unspecified Escherichia coli [E. coli] as the cause of diseases classified elsewhere: Secondary | ICD-10-CM | POA: Diagnosis not present

## 2021-11-11 DIAGNOSIS — I951 Orthostatic hypotension: Secondary | ICD-10-CM | POA: Diagnosis not present

## 2021-11-12 DIAGNOSIS — N390 Urinary tract infection, site not specified: Secondary | ICD-10-CM | POA: Diagnosis not present

## 2021-11-12 DIAGNOSIS — B9620 Unspecified Escherichia coli [E. coli] as the cause of diseases classified elsewhere: Secondary | ICD-10-CM | POA: Diagnosis not present

## 2021-11-12 DIAGNOSIS — I951 Orthostatic hypotension: Secondary | ICD-10-CM | POA: Diagnosis not present

## 2021-11-12 DIAGNOSIS — E039 Hypothyroidism, unspecified: Secondary | ICD-10-CM | POA: Diagnosis not present

## 2021-11-12 DIAGNOSIS — G20 Parkinson's disease: Secondary | ICD-10-CM | POA: Diagnosis not present

## 2021-11-12 DIAGNOSIS — I5032 Chronic diastolic (congestive) heart failure: Secondary | ICD-10-CM | POA: Diagnosis not present

## 2021-11-12 DIAGNOSIS — R69 Illness, unspecified: Secondary | ICD-10-CM | POA: Diagnosis not present

## 2021-11-19 DIAGNOSIS — I951 Orthostatic hypotension: Secondary | ICD-10-CM | POA: Diagnosis not present

## 2021-11-19 DIAGNOSIS — I5032 Chronic diastolic (congestive) heart failure: Secondary | ICD-10-CM | POA: Diagnosis not present

## 2021-11-19 DIAGNOSIS — B9620 Unspecified Escherichia coli [E. coli] as the cause of diseases classified elsewhere: Secondary | ICD-10-CM | POA: Diagnosis not present

## 2021-11-19 DIAGNOSIS — N390 Urinary tract infection, site not specified: Secondary | ICD-10-CM | POA: Diagnosis not present

## 2021-11-19 DIAGNOSIS — R69 Illness, unspecified: Secondary | ICD-10-CM | POA: Diagnosis not present

## 2021-11-19 DIAGNOSIS — E039 Hypothyroidism, unspecified: Secondary | ICD-10-CM | POA: Diagnosis not present

## 2021-11-19 DIAGNOSIS — G20 Parkinson's disease: Secondary | ICD-10-CM | POA: Diagnosis not present

## 2021-11-20 DIAGNOSIS — J9811 Atelectasis: Secondary | ICD-10-CM | POA: Diagnosis not present

## 2021-11-20 DIAGNOSIS — R1013 Epigastric pain: Secondary | ICD-10-CM | POA: Diagnosis not present

## 2021-11-20 DIAGNOSIS — K219 Gastro-esophageal reflux disease without esophagitis: Secondary | ICD-10-CM | POA: Diagnosis not present

## 2021-11-20 DIAGNOSIS — R0789 Other chest pain: Secondary | ICD-10-CM | POA: Diagnosis not present

## 2021-11-20 DIAGNOSIS — R079 Chest pain, unspecified: Secondary | ICD-10-CM | POA: Diagnosis not present

## 2021-11-20 DIAGNOSIS — Z743 Need for continuous supervision: Secondary | ICD-10-CM | POA: Diagnosis not present

## 2021-11-24 DIAGNOSIS — G20 Parkinson's disease: Secondary | ICD-10-CM | POA: Diagnosis not present

## 2021-11-26 DIAGNOSIS — N390 Urinary tract infection, site not specified: Secondary | ICD-10-CM | POA: Diagnosis not present

## 2021-11-26 DIAGNOSIS — E039 Hypothyroidism, unspecified: Secondary | ICD-10-CM | POA: Diagnosis not present

## 2021-11-26 DIAGNOSIS — I5032 Chronic diastolic (congestive) heart failure: Secondary | ICD-10-CM | POA: Diagnosis not present

## 2021-11-26 DIAGNOSIS — B9620 Unspecified Escherichia coli [E. coli] as the cause of diseases classified elsewhere: Secondary | ICD-10-CM | POA: Diagnosis not present

## 2021-11-26 DIAGNOSIS — G20 Parkinson's disease: Secondary | ICD-10-CM | POA: Diagnosis not present

## 2021-11-26 DIAGNOSIS — I951 Orthostatic hypotension: Secondary | ICD-10-CM | POA: Diagnosis not present

## 2021-11-26 DIAGNOSIS — R69 Illness, unspecified: Secondary | ICD-10-CM | POA: Diagnosis not present

## 2021-12-01 NOTE — Progress Notes (Signed)
Chief Complaint  Patient presents with   New Patient (Initial Visit)    Syncope   History of Present Illness: 81 yo female with history of Parkinson's disease, hypothyroidism, hyperlipidemia who is here today as a new consult, referred by Dr. Leonia Reader, for the evaluation of syncope. She had two syncopal events in July 2023. CT head negative. Echo 10/06/21 with LVEF=65-70%, no valve disease. She was found to have a UTI. She was orthostatic and was treated with IV fluids. She was seen in 2014 by Dr. Elease Hashimoto for chest pain and had a normal stress echo at that time. She tells me today has felt well since then. She denies any chest pain, dyspnea, recurrent dizziness, LE edema.   Primary Care Physician: Crist Fat, MD   Past Medical History:  Diagnosis Date   Abnormal involuntary movements(781.0)    Disorder of bone and cartilage, unspecified    Elevated blood pressure reading without diagnosis of hypertension    Major depressive disorder, single episode, unspecified    Other musculoskeletal symptoms referable to limbs(729.89)    Pain in joint, lower leg    Pure hypercholesterolemia     Past Surgical History:  Procedure Laterality Date   BREAST SURGERY  1980's   benign cyst   COLONOSCOPY  1995, 1998   VAGINAL HYSTERECTOMY  1975    Current Outpatient Medications  Medication Sig Dispense Refill   b complex vitamins tablet Take 1 tablet by mouth daily.     Calcium Carbonate-Vit D-Min (CALCIUM 1200 PO) Take 1 tablet by mouth daily. Take one tablet a day     carbidopa-levodopa (SINEMET IR) 25-100 MG tablet Take 1 tablet by mouth 6 (six) times daily. Take at 4 AM, 7 AM, 10 AM, 1 PM, 4 PM and  7 PM 540 tablet 3   cholecalciferol (VITAMIN D) 25 MCG tablet Take 1 tablet (1,000 Units total) by mouth daily. 30 tablet    D-Mannose 500 MG CAPS Take 1 Capful by mouth in the morning and at bedtime.     escitalopram (LEXAPRO) 20 MG tablet Take 10 mg by mouth 2 (two) times daily.  4   fish  oil-omega-3 fatty acids 1000 MG capsule Take 1 g by mouth daily.     levothyroxine (SYNTHROID) 75 MCG tablet Take 75 mcg by mouth every morning.     OMEPRAZOLE PO Take 20 mg by mouth daily.     polyethylene glycol (MIRALAX / GLYCOLAX) 17 g packet Take 17 g by mouth daily as needed for mild constipation.     Probiotic Product (PROBIOTIC PO) Take 1 capsule by mouth daily.     rasagiline (AZILECT) 1 MG TABS tablet Take 1 tablet (1 mg total) by mouth daily. 90 tablet 3   No current facility-administered medications for this visit.    No Known Allergies  Social History   Socioeconomic History   Marital status: Married    Spouse name: Not on file   Number of children: Not on file   Years of education: Not on file   Highest education level: Not on file  Occupational History   Not on file  Tobacco Use   Smoking status: Never   Smokeless tobacco: Never  Substance and Sexual Activity   Alcohol use: No    Alcohol/week: 0.0 standard drinks of alcohol   Drug use: No   Sexual activity: Not on file  Other Topics Concern   Not on file  Social History Narrative   Not  on file   Social Determinants of Health   Financial Resource Strain: Not on file  Food Insecurity: Not on file  Transportation Needs: Not on file  Physical Activity: Not on file  Stress: Not on file  Social Connections: Not on file  Intimate Partner Violence: Not on file    Family History  Problem Relation Age of Onset   CAD Mother    Breast cancer Mother    CAD Father    Parkinson's disease Father    Tremor Sister    Parkinson's disease Sister    Hyperlipidemia Sister    Hypertension Sister    CAD Brother    Breast cancer Maternal Aunt    Colon cancer Maternal Aunt     Review of Systems:  As stated in the HPI and otherwise negative.   BP 136/70   Pulse 84   Ht 5\' 7"  (1.702 m)   Wt 157 lb (71.2 kg)   SpO2 97%   BMI 24.59 kg/m   Physical Examination: General: Well developed, well nourished, NAD   HEENT: OP clear, mucus membranes moist  SKIN: warm, dry. No rashes. Neuro: No focal deficits  Musculoskeletal: Muscle strength 5/5 all ext  Psychiatric: Mood and affect normal  Neck: No JVD, no carotid bruits, no thyromegaly, no lymphadenopathy.  Lungs:Clear bilaterally, no wheezes, rhonci, crackles Cardiovascular: Regular rate and rhythm. No murmurs, gallops or rubs. Abdomen:Soft. Bowel sounds present. Non-tender.  Extremities: No lower extremity edema. Pulses are 2 + in the bilateral DP/PT.  EKG:  EKG is not ordered today. The ekg ordered today demonstrates   Echo 10/06/21:  1. Left ventricular ejection fraction, by estimation, is 65 to 70%. The  left ventricle has normal function. The left ventricle has no regional  wall motion abnormalities. There is mild asymmetric left ventricular  hypertrophy of the basal-septal segment.  Left ventricular diastolic parameters are consistent with Grade I  diastolic dysfunction (impaired relaxation).   2. Right ventricular systolic function is normal. The right ventricular  size is normal. Tricuspid regurgitation signal is inadequate for assessing  PA pressure.   3. The mitral valve is normal in structure. No evidence of mitral valve  regurgitation. No evidence of mitral stenosis.   4. The aortic valve is tricuspid. Aortic valve regurgitation is not  visualized. Aortic valve sclerosis is present, with no evidence of aortic  valve stenosis.   Recent Labs: 10/08/2021: ALT 10; BUN 18; Creatinine, Ser 0.49; Hemoglobin 12.1; Magnesium 2.1; Platelets 145; Potassium 3.5; Sodium 142   Lipid Panel No results found for: "CHOL", "TRIG", "HDL", "CHOLHDL", "VLDL", "LDLCALC", "LDLDIRECT"   Wt Readings from Last 3 Encounters:  12/02/21 157 lb (71.2 kg)  10/23/21 155 lb 12.8 oz (70.7 kg)  10/10/21 161 lb 9.6 oz (73.3 kg)      Assessment and Plan:   1. Syncope: This was in the setting of a UTI and orthostatic hypotension likely from her UTI. Echo  with normal LV function. Cardiac exam is normal. I do not think further cardiac workup is indicated at this time. I will not arrange any cardiac testing today.   Labs/ tests ordered today include:  No orders of the defined types were placed in this encounter.  Disposition:   F/U with me in one year.    Signed, 10/12/21, MD 12/02/2021 10:52 AM    [facility] [address] [place], [place], [place]  [place] Phone: [phone]; Fax: [phone]

## 2021-12-02 ENCOUNTER — Ambulatory Visit: Payer: Medicare HMO | Attending: Cardiovascular Disease | Admitting: Cardiovascular Disease

## 2021-12-02 ENCOUNTER — Encounter: Payer: Self-pay | Admitting: Cardiovascular Disease

## 2021-12-02 ENCOUNTER — Other Ambulatory Visit: Payer: Self-pay | Admitting: Neurology

## 2021-12-02 VITALS — BP 136/70 | HR 84 | Ht 67.0 in | Wt 157.0 lb

## 2021-12-02 DIAGNOSIS — G20 Parkinson's disease: Secondary | ICD-10-CM

## 2021-12-02 DIAGNOSIS — R55 Syncope and collapse: Secondary | ICD-10-CM | POA: Diagnosis not present

## 2021-12-02 MED ORDER — RASAGILINE MESYLATE 1 MG PO TABS: 1.0000 mg | ORAL_TABLET | Freq: Every day | ORAL | 3 refills | Status: AC

## 2021-12-02 NOTE — Patient Instructions (Signed)
Medication Instructions:  Your physician recommends that you continue on your current medications as directed. Please refer to the Current Medication list given to you today.  *If you need a refill on your cardiac medications before your next appointment, please call your pharmacy*   Lab Work: none If you have labs (blood work) drawn today and your tests are completely normal, you will receive your results only by: MyChart Message (if you have MyChart) OR A paper copy in the mail If you have any lab test that is abnormal or we need to change your treatment, we will call you to review the results.   Testing/Procedures: none   Follow-Up: At [facility], you and your health needs are our priority.  As part of our continuing mission to provide you with exceptional heart care, we have created designated Provider Care Teams.  These Care Teams include your primary Cardiologist (physician) and Advanced Practice Providers (APPs -  Physician Assistants and Nurse Practitioners) who all work together to provide you with the care you need, when you need it.  We recommend signing up for the patient portal called "MyChart".  Sign up information is provided on this After Visit Summary.  MyChart is used to connect with patients for Virtual Visits (Telemedicine).  Patients are able to view lab/test results, encounter notes, upcoming appointments, etc.  Non-urgent messages can be sent to your provider as well.   To learn more about what you can do with MyChart, go to [email or web address].    Your next appointment:   12 month(s)  The format for your next appointment:   In Person  Provider:   Dr McAlhany    Other Instructions    Important Information About Sugar       

## 2021-12-03 DIAGNOSIS — G20 Parkinson's disease: Secondary | ICD-10-CM | POA: Diagnosis not present

## 2021-12-03 DIAGNOSIS — B9620 Unspecified Escherichia coli [E. coli] as the cause of diseases classified elsewhere: Secondary | ICD-10-CM | POA: Diagnosis not present

## 2021-12-03 DIAGNOSIS — N390 Urinary tract infection, site not specified: Secondary | ICD-10-CM | POA: Diagnosis not present

## 2021-12-03 DIAGNOSIS — I951 Orthostatic hypotension: Secondary | ICD-10-CM | POA: Diagnosis not present

## 2021-12-03 DIAGNOSIS — I5032 Chronic diastolic (congestive) heart failure: Secondary | ICD-10-CM | POA: Diagnosis not present

## 2021-12-03 DIAGNOSIS — R69 Illness, unspecified: Secondary | ICD-10-CM | POA: Diagnosis not present

## 2021-12-03 DIAGNOSIS — E039 Hypothyroidism, unspecified: Secondary | ICD-10-CM | POA: Diagnosis not present

## 2021-12-04 DIAGNOSIS — R69 Illness, unspecified: Secondary | ICD-10-CM | POA: Diagnosis not present

## 2021-12-04 DIAGNOSIS — I959 Hypotension, unspecified: Secondary | ICD-10-CM | POA: Diagnosis not present

## 2021-12-04 DIAGNOSIS — Z743 Need for continuous supervision: Secondary | ICD-10-CM | POA: Diagnosis not present

## 2021-12-04 DIAGNOSIS — R519 Headache, unspecified: Secondary | ICD-10-CM | POA: Diagnosis not present

## 2021-12-04 DIAGNOSIS — I5031 Acute diastolic (congestive) heart failure: Secondary | ICD-10-CM | POA: Diagnosis not present

## 2021-12-04 DIAGNOSIS — R079 Chest pain, unspecified: Secondary | ICD-10-CM | POA: Diagnosis not present

## 2021-12-04 DIAGNOSIS — R000 Tachycardia, unspecified: Secondary | ICD-10-CM | POA: Diagnosis not present

## 2021-12-04 DIAGNOSIS — Z79899 Other long term (current) drug therapy: Secondary | ICD-10-CM | POA: Diagnosis not present

## 2021-12-04 DIAGNOSIS — J189 Pneumonia, unspecified organism: Secondary | ICD-10-CM | POA: Diagnosis not present

## 2021-12-04 DIAGNOSIS — K219 Gastro-esophageal reflux disease without esophagitis: Secondary | ICD-10-CM | POA: Diagnosis not present

## 2021-12-04 DIAGNOSIS — R509 Fever, unspecified: Secondary | ICD-10-CM | POA: Diagnosis not present

## 2021-12-04 DIAGNOSIS — R0609 Other forms of dyspnea: Secondary | ICD-10-CM | POA: Diagnosis not present

## 2021-12-04 DIAGNOSIS — R0902 Hypoxemia: Secondary | ICD-10-CM | POA: Diagnosis not present

## 2021-12-04 DIAGNOSIS — Z20822 Contact with and (suspected) exposure to covid-19: Secondary | ICD-10-CM | POA: Diagnosis not present

## 2021-12-04 DIAGNOSIS — R0689 Other abnormalities of breathing: Secondary | ICD-10-CM | POA: Diagnosis not present

## 2021-12-04 DIAGNOSIS — R918 Other nonspecific abnormal finding of lung field: Secondary | ICD-10-CM | POA: Diagnosis not present

## 2021-12-04 DIAGNOSIS — R0602 Shortness of breath: Secondary | ICD-10-CM | POA: Diagnosis not present

## 2021-12-04 DIAGNOSIS — G20 Parkinson's disease: Secondary | ICD-10-CM | POA: Diagnosis not present

## 2021-12-04 DIAGNOSIS — S2231XA Fracture of one rib, right side, initial encounter for closed fracture: Secondary | ICD-10-CM | POA: Diagnosis not present

## 2021-12-04 DIAGNOSIS — M1990 Unspecified osteoarthritis, unspecified site: Secondary | ICD-10-CM | POA: Diagnosis not present

## 2021-12-04 DIAGNOSIS — E039 Hypothyroidism, unspecified: Secondary | ICD-10-CM | POA: Diagnosis not present

## 2021-12-12 DIAGNOSIS — R55 Syncope and collapse: Secondary | ICD-10-CM | POA: Diagnosis not present

## 2021-12-12 DIAGNOSIS — I959 Hypotension, unspecified: Secondary | ICD-10-CM | POA: Diagnosis not present

## 2021-12-12 DIAGNOSIS — M1990 Unspecified osteoarthritis, unspecified site: Secondary | ICD-10-CM | POA: Diagnosis not present

## 2021-12-12 DIAGNOSIS — R531 Weakness: Secondary | ICD-10-CM | POA: Diagnosis not present

## 2021-12-12 DIAGNOSIS — R197 Diarrhea, unspecified: Secondary | ICD-10-CM | POA: Diagnosis not present

## 2021-12-12 DIAGNOSIS — I951 Orthostatic hypotension: Secondary | ICD-10-CM | POA: Diagnosis not present

## 2021-12-12 DIAGNOSIS — Z743 Need for continuous supervision: Secondary | ICD-10-CM | POA: Diagnosis not present

## 2021-12-12 DIAGNOSIS — J9811 Atelectasis: Secondary | ICD-10-CM | POA: Diagnosis not present

## 2021-12-12 DIAGNOSIS — E039 Hypothyroidism, unspecified: Secondary | ICD-10-CM | POA: Diagnosis not present

## 2021-12-12 DIAGNOSIS — G20 Parkinson's disease: Secondary | ICD-10-CM | POA: Diagnosis not present

## 2021-12-12 DIAGNOSIS — Z8701 Personal history of pneumonia (recurrent): Secondary | ICD-10-CM | POA: Diagnosis not present

## 2021-12-19 DIAGNOSIS — I509 Heart failure, unspecified: Secondary | ICD-10-CM | POA: Diagnosis not present

## 2021-12-19 DIAGNOSIS — J189 Pneumonia, unspecified organism: Secondary | ICD-10-CM | POA: Diagnosis not present

## 2021-12-19 DIAGNOSIS — I951 Orthostatic hypotension: Secondary | ICD-10-CM | POA: Diagnosis not present

## 2021-12-19 DIAGNOSIS — R531 Weakness: Secondary | ICD-10-CM | POA: Diagnosis not present

## 2021-12-30 DIAGNOSIS — Z111 Encounter for screening for respiratory tuberculosis: Secondary | ICD-10-CM | POA: Diagnosis not present

## 2022-01-05 DIAGNOSIS — J9610 Chronic respiratory failure, unspecified whether with hypoxia or hypercapnia: Secondary | ICD-10-CM | POA: Diagnosis not present

## 2022-01-05 DIAGNOSIS — H6092 Unspecified otitis externa, left ear: Secondary | ICD-10-CM | POA: Diagnosis not present

## 2022-01-05 DIAGNOSIS — M6281 Muscle weakness (generalized): Secondary | ICD-10-CM | POA: Diagnosis not present

## 2022-01-05 DIAGNOSIS — R2681 Unsteadiness on feet: Secondary | ICD-10-CM | POA: Diagnosis not present

## 2022-01-05 DIAGNOSIS — H61303 Acquired stenosis of external ear canal, unspecified, bilateral: Secondary | ICD-10-CM | POA: Diagnosis not present

## 2022-01-05 DIAGNOSIS — H6122 Impacted cerumen, left ear: Secondary | ICD-10-CM | POA: Diagnosis not present

## 2022-01-06 DIAGNOSIS — G20C Parkinsonism, unspecified: Secondary | ICD-10-CM | POA: Diagnosis not present

## 2022-01-06 DIAGNOSIS — R2681 Unsteadiness on feet: Secondary | ICD-10-CM | POA: Diagnosis not present

## 2022-01-06 DIAGNOSIS — M6281 Muscle weakness (generalized): Secondary | ICD-10-CM | POA: Diagnosis not present

## 2022-01-06 DIAGNOSIS — J9610 Chronic respiratory failure, unspecified whether with hypoxia or hypercapnia: Secondary | ICD-10-CM | POA: Diagnosis not present

## 2022-01-06 DIAGNOSIS — I951 Orthostatic hypotension: Secondary | ICD-10-CM | POA: Diagnosis not present

## 2022-01-06 DIAGNOSIS — Z9981 Dependence on supplemental oxygen: Secondary | ICD-10-CM | POA: Diagnosis not present

## 2022-01-06 DIAGNOSIS — I509 Heart failure, unspecified: Secondary | ICD-10-CM | POA: Diagnosis not present

## 2022-01-07 DIAGNOSIS — M6281 Muscle weakness (generalized): Secondary | ICD-10-CM | POA: Diagnosis not present

## 2022-01-07 DIAGNOSIS — E559 Vitamin D deficiency, unspecified: Secondary | ICD-10-CM | POA: Diagnosis not present

## 2022-01-07 DIAGNOSIS — R2681 Unsteadiness on feet: Secondary | ICD-10-CM | POA: Diagnosis not present

## 2022-01-07 DIAGNOSIS — J9610 Chronic respiratory failure, unspecified whether with hypoxia or hypercapnia: Secondary | ICD-10-CM | POA: Diagnosis not present

## 2022-01-07 DIAGNOSIS — E119 Type 2 diabetes mellitus without complications: Secondary | ICD-10-CM | POA: Diagnosis not present

## 2022-01-08 DIAGNOSIS — G20C Parkinsonism, unspecified: Secondary | ICD-10-CM | POA: Diagnosis not present

## 2022-01-08 DIAGNOSIS — E785 Hyperlipidemia, unspecified: Secondary | ICD-10-CM | POA: Diagnosis not present

## 2022-01-08 DIAGNOSIS — I509 Heart failure, unspecified: Secondary | ICD-10-CM | POA: Diagnosis not present

## 2022-01-08 DIAGNOSIS — J9610 Chronic respiratory failure, unspecified whether with hypoxia or hypercapnia: Secondary | ICD-10-CM | POA: Diagnosis not present

## 2022-01-08 DIAGNOSIS — E039 Hypothyroidism, unspecified: Secondary | ICD-10-CM | POA: Diagnosis not present

## 2022-01-08 DIAGNOSIS — R2681 Unsteadiness on feet: Secondary | ICD-10-CM | POA: Diagnosis not present

## 2022-01-08 DIAGNOSIS — M6281 Muscle weakness (generalized): Secondary | ICD-10-CM | POA: Diagnosis not present

## 2022-01-09 DIAGNOSIS — J9610 Chronic respiratory failure, unspecified whether with hypoxia or hypercapnia: Secondary | ICD-10-CM | POA: Diagnosis not present

## 2022-01-09 DIAGNOSIS — M6281 Muscle weakness (generalized): Secondary | ICD-10-CM | POA: Diagnosis not present

## 2022-01-09 DIAGNOSIS — R2681 Unsteadiness on feet: Secondary | ICD-10-CM | POA: Diagnosis not present

## 2022-01-10 DIAGNOSIS — J9610 Chronic respiratory failure, unspecified whether with hypoxia or hypercapnia: Secondary | ICD-10-CM | POA: Diagnosis not present

## 2022-01-10 DIAGNOSIS — R2681 Unsteadiness on feet: Secondary | ICD-10-CM | POA: Diagnosis not present

## 2022-01-10 DIAGNOSIS — M6281 Muscle weakness (generalized): Secondary | ICD-10-CM | POA: Diagnosis not present

## 2022-01-11 DIAGNOSIS — I951 Orthostatic hypotension: Secondary | ICD-10-CM | POA: Diagnosis not present

## 2022-01-11 DIAGNOSIS — I509 Heart failure, unspecified: Secondary | ICD-10-CM | POA: Diagnosis not present

## 2022-01-11 DIAGNOSIS — M6281 Muscle weakness (generalized): Secondary | ICD-10-CM | POA: Diagnosis not present

## 2022-01-11 DIAGNOSIS — J189 Pneumonia, unspecified organism: Secondary | ICD-10-CM | POA: Diagnosis not present

## 2022-01-11 DIAGNOSIS — R531 Weakness: Secondary | ICD-10-CM | POA: Diagnosis not present

## 2022-01-11 DIAGNOSIS — R2681 Unsteadiness on feet: Secondary | ICD-10-CM | POA: Diagnosis not present

## 2022-01-11 DIAGNOSIS — J9610 Chronic respiratory failure, unspecified whether with hypoxia or hypercapnia: Secondary | ICD-10-CM | POA: Diagnosis not present

## 2022-01-12 DIAGNOSIS — R2681 Unsteadiness on feet: Secondary | ICD-10-CM | POA: Diagnosis not present

## 2022-01-12 DIAGNOSIS — J9610 Chronic respiratory failure, unspecified whether with hypoxia or hypercapnia: Secondary | ICD-10-CM | POA: Diagnosis not present

## 2022-01-12 DIAGNOSIS — M6281 Muscle weakness (generalized): Secondary | ICD-10-CM | POA: Diagnosis not present

## 2022-01-13 DIAGNOSIS — M6281 Muscle weakness (generalized): Secondary | ICD-10-CM | POA: Diagnosis not present

## 2022-01-13 DIAGNOSIS — R2681 Unsteadiness on feet: Secondary | ICD-10-CM | POA: Diagnosis not present

## 2022-01-13 DIAGNOSIS — J9610 Chronic respiratory failure, unspecified whether with hypoxia or hypercapnia: Secondary | ICD-10-CM | POA: Diagnosis not present

## 2022-01-14 DIAGNOSIS — R2681 Unsteadiness on feet: Secondary | ICD-10-CM | POA: Diagnosis not present

## 2022-01-14 DIAGNOSIS — J9610 Chronic respiratory failure, unspecified whether with hypoxia or hypercapnia: Secondary | ICD-10-CM | POA: Diagnosis not present

## 2022-01-14 DIAGNOSIS — M6281 Muscle weakness (generalized): Secondary | ICD-10-CM | POA: Diagnosis not present

## 2022-01-15 DIAGNOSIS — R2681 Unsteadiness on feet: Secondary | ICD-10-CM | POA: Diagnosis not present

## 2022-01-15 DIAGNOSIS — J9610 Chronic respiratory failure, unspecified whether with hypoxia or hypercapnia: Secondary | ICD-10-CM | POA: Diagnosis not present

## 2022-01-15 DIAGNOSIS — M6281 Muscle weakness (generalized): Secondary | ICD-10-CM | POA: Diagnosis not present

## 2022-01-16 DIAGNOSIS — M6281 Muscle weakness (generalized): Secondary | ICD-10-CM | POA: Diagnosis not present

## 2022-01-16 DIAGNOSIS — H6092 Unspecified otitis externa, left ear: Secondary | ICD-10-CM | POA: Diagnosis not present

## 2022-01-16 DIAGNOSIS — H61303 Acquired stenosis of external ear canal, unspecified, bilateral: Secondary | ICD-10-CM | POA: Diagnosis not present

## 2022-01-16 DIAGNOSIS — R2681 Unsteadiness on feet: Secondary | ICD-10-CM | POA: Diagnosis not present

## 2022-01-16 DIAGNOSIS — J9610 Chronic respiratory failure, unspecified whether with hypoxia or hypercapnia: Secondary | ICD-10-CM | POA: Diagnosis not present

## 2022-01-16 DIAGNOSIS — Z789 Other specified health status: Secondary | ICD-10-CM | POA: Diagnosis not present

## 2022-01-19 DIAGNOSIS — J9610 Chronic respiratory failure, unspecified whether with hypoxia or hypercapnia: Secondary | ICD-10-CM | POA: Diagnosis not present

## 2022-01-19 DIAGNOSIS — M6281 Muscle weakness (generalized): Secondary | ICD-10-CM | POA: Diagnosis not present

## 2022-01-19 DIAGNOSIS — K219 Gastro-esophageal reflux disease without esophagitis: Secondary | ICD-10-CM | POA: Diagnosis not present

## 2022-01-19 DIAGNOSIS — R2681 Unsteadiness on feet: Secondary | ICD-10-CM | POA: Diagnosis not present

## 2022-01-19 DIAGNOSIS — W19XXXA Unspecified fall, initial encounter: Secondary | ICD-10-CM | POA: Diagnosis not present

## 2022-01-20 DIAGNOSIS — R2681 Unsteadiness on feet: Secondary | ICD-10-CM | POA: Diagnosis not present

## 2022-01-20 DIAGNOSIS — J9610 Chronic respiratory failure, unspecified whether with hypoxia or hypercapnia: Secondary | ICD-10-CM | POA: Diagnosis not present

## 2022-01-20 DIAGNOSIS — M6281 Muscle weakness (generalized): Secondary | ICD-10-CM | POA: Diagnosis not present

## 2022-01-21 DIAGNOSIS — J9610 Chronic respiratory failure, unspecified whether with hypoxia or hypercapnia: Secondary | ICD-10-CM | POA: Diagnosis not present

## 2022-01-21 DIAGNOSIS — R2681 Unsteadiness on feet: Secondary | ICD-10-CM | POA: Diagnosis not present

## 2022-01-21 DIAGNOSIS — M6281 Muscle weakness (generalized): Secondary | ICD-10-CM | POA: Diagnosis not present

## 2022-01-22 DIAGNOSIS — M6281 Muscle weakness (generalized): Secondary | ICD-10-CM | POA: Diagnosis not present

## 2022-01-22 DIAGNOSIS — J9610 Chronic respiratory failure, unspecified whether with hypoxia or hypercapnia: Secondary | ICD-10-CM | POA: Diagnosis not present

## 2022-01-22 DIAGNOSIS — R2681 Unsteadiness on feet: Secondary | ICD-10-CM | POA: Diagnosis not present

## 2022-01-23 DIAGNOSIS — R2681 Unsteadiness on feet: Secondary | ICD-10-CM | POA: Diagnosis not present

## 2022-01-23 DIAGNOSIS — J9610 Chronic respiratory failure, unspecified whether with hypoxia or hypercapnia: Secondary | ICD-10-CM | POA: Diagnosis not present

## 2022-01-23 DIAGNOSIS — M6281 Muscle weakness (generalized): Secondary | ICD-10-CM | POA: Diagnosis not present

## 2022-01-26 DIAGNOSIS — R2681 Unsteadiness on feet: Secondary | ICD-10-CM | POA: Diagnosis not present

## 2022-01-26 DIAGNOSIS — J9610 Chronic respiratory failure, unspecified whether with hypoxia or hypercapnia: Secondary | ICD-10-CM | POA: Diagnosis not present

## 2022-01-26 DIAGNOSIS — M6281 Muscle weakness (generalized): Secondary | ICD-10-CM | POA: Diagnosis not present

## 2022-01-27 DIAGNOSIS — M6281 Muscle weakness (generalized): Secondary | ICD-10-CM | POA: Diagnosis not present

## 2022-01-27 DIAGNOSIS — J9610 Chronic respiratory failure, unspecified whether with hypoxia or hypercapnia: Secondary | ICD-10-CM | POA: Diagnosis not present

## 2022-01-27 DIAGNOSIS — R2681 Unsteadiness on feet: Secondary | ICD-10-CM | POA: Diagnosis not present

## 2022-01-28 DIAGNOSIS — M6281 Muscle weakness (generalized): Secondary | ICD-10-CM | POA: Diagnosis not present

## 2022-01-28 DIAGNOSIS — J9610 Chronic respiratory failure, unspecified whether with hypoxia or hypercapnia: Secondary | ICD-10-CM | POA: Diagnosis not present

## 2022-01-28 DIAGNOSIS — R2681 Unsteadiness on feet: Secondary | ICD-10-CM | POA: Diagnosis not present

## 2022-01-29 DIAGNOSIS — M6281 Muscle weakness (generalized): Secondary | ICD-10-CM | POA: Diagnosis not present

## 2022-01-29 DIAGNOSIS — J9610 Chronic respiratory failure, unspecified whether with hypoxia or hypercapnia: Secondary | ICD-10-CM | POA: Diagnosis not present

## 2022-01-29 DIAGNOSIS — R2681 Unsteadiness on feet: Secondary | ICD-10-CM | POA: Diagnosis not present

## 2022-01-29 DIAGNOSIS — I951 Orthostatic hypotension: Secondary | ICD-10-CM | POA: Diagnosis not present

## 2022-01-30 DIAGNOSIS — R2681 Unsteadiness on feet: Secondary | ICD-10-CM | POA: Diagnosis not present

## 2022-01-30 DIAGNOSIS — M6281 Muscle weakness (generalized): Secondary | ICD-10-CM | POA: Diagnosis not present

## 2022-01-30 DIAGNOSIS — J9610 Chronic respiratory failure, unspecified whether with hypoxia or hypercapnia: Secondary | ICD-10-CM | POA: Diagnosis not present

## 2022-02-02 DIAGNOSIS — R2681 Unsteadiness on feet: Secondary | ICD-10-CM | POA: Diagnosis not present

## 2022-02-02 DIAGNOSIS — J9610 Chronic respiratory failure, unspecified whether with hypoxia or hypercapnia: Secondary | ICD-10-CM | POA: Diagnosis not present

## 2022-02-02 DIAGNOSIS — M6281 Muscle weakness (generalized): Secondary | ICD-10-CM | POA: Diagnosis not present

## 2022-02-03 DIAGNOSIS — R296 Repeated falls: Secondary | ICD-10-CM | POA: Diagnosis not present

## 2022-02-03 DIAGNOSIS — J9610 Chronic respiratory failure, unspecified whether with hypoxia or hypercapnia: Secondary | ICD-10-CM | POA: Diagnosis not present

## 2022-02-03 DIAGNOSIS — R2681 Unsteadiness on feet: Secondary | ICD-10-CM | POA: Diagnosis not present

## 2022-02-03 DIAGNOSIS — I951 Orthostatic hypotension: Secondary | ICD-10-CM | POA: Diagnosis not present

## 2022-02-03 DIAGNOSIS — M6281 Muscle weakness (generalized): Secondary | ICD-10-CM | POA: Diagnosis not present

## 2022-02-04 DIAGNOSIS — U071 COVID-19: Secondary | ICD-10-CM | POA: Diagnosis not present

## 2022-02-05 DIAGNOSIS — R296 Repeated falls: Secondary | ICD-10-CM | POA: Diagnosis not present

## 2022-02-05 DIAGNOSIS — U071 COVID-19: Secondary | ICD-10-CM | POA: Diagnosis not present

## 2022-02-05 DIAGNOSIS — G20C Parkinsonism, unspecified: Secondary | ICD-10-CM | POA: Diagnosis not present

## 2022-02-05 DIAGNOSIS — K219 Gastro-esophageal reflux disease without esophagitis: Secondary | ICD-10-CM | POA: Diagnosis not present

## 2022-02-09 DIAGNOSIS — R2681 Unsteadiness on feet: Secondary | ICD-10-CM | POA: Diagnosis not present

## 2022-02-09 DIAGNOSIS — J9610 Chronic respiratory failure, unspecified whether with hypoxia or hypercapnia: Secondary | ICD-10-CM | POA: Diagnosis not present

## 2022-02-09 DIAGNOSIS — M6281 Muscle weakness (generalized): Secondary | ICD-10-CM | POA: Diagnosis not present

## 2022-02-10 DIAGNOSIS — R2681 Unsteadiness on feet: Secondary | ICD-10-CM | POA: Diagnosis not present

## 2022-02-10 DIAGNOSIS — J9610 Chronic respiratory failure, unspecified whether with hypoxia or hypercapnia: Secondary | ICD-10-CM | POA: Diagnosis not present

## 2022-02-10 DIAGNOSIS — M6281 Muscle weakness (generalized): Secondary | ICD-10-CM | POA: Diagnosis not present

## 2022-02-11 DIAGNOSIS — J9610 Chronic respiratory failure, unspecified whether with hypoxia or hypercapnia: Secondary | ICD-10-CM | POA: Diagnosis not present

## 2022-02-11 DIAGNOSIS — M6281 Muscle weakness (generalized): Secondary | ICD-10-CM | POA: Diagnosis not present

## 2022-02-11 DIAGNOSIS — R2681 Unsteadiness on feet: Secondary | ICD-10-CM | POA: Diagnosis not present

## 2022-02-12 DIAGNOSIS — R2681 Unsteadiness on feet: Secondary | ICD-10-CM | POA: Diagnosis not present

## 2022-02-12 DIAGNOSIS — M6281 Muscle weakness (generalized): Secondary | ICD-10-CM | POA: Diagnosis not present

## 2022-02-12 DIAGNOSIS — J9610 Chronic respiratory failure, unspecified whether with hypoxia or hypercapnia: Secondary | ICD-10-CM | POA: Diagnosis not present

## 2022-02-13 DIAGNOSIS — M6281 Muscle weakness (generalized): Secondary | ICD-10-CM | POA: Diagnosis not present

## 2022-02-13 DIAGNOSIS — R2681 Unsteadiness on feet: Secondary | ICD-10-CM | POA: Diagnosis not present

## 2022-02-13 DIAGNOSIS — J9610 Chronic respiratory failure, unspecified whether with hypoxia or hypercapnia: Secondary | ICD-10-CM | POA: Diagnosis not present

## 2022-02-16 DIAGNOSIS — J9610 Chronic respiratory failure, unspecified whether with hypoxia or hypercapnia: Secondary | ICD-10-CM | POA: Diagnosis not present

## 2022-02-16 DIAGNOSIS — R2681 Unsteadiness on feet: Secondary | ICD-10-CM | POA: Diagnosis not present

## 2022-02-16 DIAGNOSIS — M6281 Muscle weakness (generalized): Secondary | ICD-10-CM | POA: Diagnosis not present

## 2022-02-16 NOTE — Progress Notes (Unsigned)
No chief complaint on file.  HISTORY OF PRESENT ILLNESS:  02/16/22 ALL:  Tami Vega returns for follow up for for PD. She was last seen by Dr Rexene Alberts 09/2021. She had been seen in the ER following a syncopal spell. Dr Rexene Alberts weaned Comtan and increased Sinemet to 1 tablet six times daily. Azilect was continued at mg daily.   10/23/2021 AA: Tami Vega is a very pleasant 81 year old right-handed woman with an underlying medical history of hyperlipidemia, history of pituitary microadenoma, precancerous breast lesions status post double mastectomy in the 1980s, who presents for follow-up consultation of her left-sided predominant Parkinson's disease. The patient is accompanied by her sister today. I last saw her on 04/15/2021, at which time she was advised to continue with Sinemet 5 times a day, entacapone 3 times a day and Azilect once daily.  She was on Lexapro 10 mg daily.  She was advised to follow-up with her primary care to optimize treatment for anxiety and stress.  She saw Debbora Presto, NP in the interim on 08/20/2021, at which time she was advised to add Parcopa half a tablet up to twice daily as needed for additional help with her regular levodopa wearing off.   Today, 10/23/2021: She reports doing a little better.  She had a recent follow-up with her primary care physician on 10/15/2021 and I reviewed the office note.  She was found to have orthostatic hypotension at the time.  She was encouraged to drink more water.  She is indicated that she is trying to drink more water but sister believes that she may not always drink enough.  Patient estimates that she averages about 2 bottles of water per day, 16.9 ounce size.  She is getting home health PT but this week at least once she was not able to participate as her physical therapist reported that her blood pressure was too low, they are not sure as to the exact number.  Her son, Alroy Dust who lives with them may be moving out at some point soon, no exact date.   Her other son, Remo Lipps is power of attorney.  She also has a daughter who lives about 15 minutes away.  She takes her Sinemet 5 times a day starting at 7 AM, 3 hourly.  She goes to bed between 7 and 8.  She is not sleeping well through the night and often is awake in the early morning hours and lays in bed but cannot go back to sleep.   She went to the emergency room on 10/02/2021 after a fall in the bathroom.  She sustained a laceration inside the mouth.  I reviewed the emergency room records.  She was found to have low blood pressure and received IV fluids.  She had a head CT and cervical spine CT without contrast as well as maxillofacial CT without contrast on 10/02/2021 and I reviewed the results:  IMPRESSION: 1. No acute intracranial abnormality. No skull fracture. 2. Age related atrophy.   1. No acute fracture or subluxation of the cervical spine. 2. Mild multilevel degenerative disc disease and facet hypertrophy.   IMPRESSION: 1. Soft tissue laceration over the left chin with small hematoma. No acute facial bone fracture. 2. Unchanged partially calcified soft tissue density or small mass in the right superior nasal passage. This is stable dating back to 2021 head CT. As clinically indicated, consider ENT evaluation.     She was seen in the emergency room at [facility] on 10/05/2021 after a  syncopal event. She was admitted to the hospital.  I reviewed the hospital records, she was discharged on 10/09/2021.  She had a head CT without contrast and cervical spine CT without contrast on 10/06/2021 and I reviewed the results: IMPRESSION: CT of the head: Chronic atrophic changes are again identified. No acute intracranial abnormality noted.   New left parietal scalp hematoma near the vertex.   CT of the cervical spine: Multilevel degenerative change without acute abnormality. The overall appearance is stable from the prior CT.   She had orthostatic hypotension, she was treated  symptomatically with fluids.  She was treated for a E. coli UTI.  She was initially on Rocephin and switched to cefdinir.  She had hypokalemia, physical deconditioning, leukocytosis.  She was discharged to a skilled nursing facility.  08/20/2021 ALL: Tami Vega returns for follow up for PD. She was last seen by Dr Rexene Alberts 03/2021. She reported more stress at home due to husbands illness. Her son had moved in with them to help provide care. Since, she reports symptoms are fairly stable. She continues Sinemet 1 tablet 5 times daily (starting at 6:30-7 and every 3 hours thereafter), Comtan TID and Azilect 12m daily continued. About 2-3 times a week she feels that she needs an extra dose of Sinemet. She can tell a difference in her movement and voice if she takes dose closer to 2.5 hours apart. She feels gait is fairly stable. She had a fall a couple of months ago. No injuries. She uses her walker at all times. She is not sleeping as well. She remains under more stress due to her husband suffering with dementia. He has progressively declined over the past 3-4 months. He is loosing weight and more irritable. Her son is helping care for her husband. They also have a family member who comes to sit with her once a week. SRemo Lipps her youngest son, and her daughter also help. She is followed closely by PCP. She continues escitalopram 126mdaily. She enjoys sewing and crafting. 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   Can call StRemo [id number]/18/2022 ALL:  Tami Vega a [age].o. female here today for follow up for PD. She was last seen by Dr AtRexene Alberts/2022 and continued on Sinemet 1 tablet 5 times daily, Azilect 1 tablet daily and entacapone 1 tablet TID. She was participating in home PT and advised to increase water intake and use walker. Since, she reports doing fairly well. No recent falls. She is using her walker. Balance seems to be  fairly stable. Tremor is very mild. She is tolerating medications well and without adverse effects. She feels that she is doing fairly well. She has good days and bad days. She is eating normally. Some foods do not taste as good over the past few months. She drinks a protein shake every day. She tries to drink at least 3 bottles of water. She reports having shoulder pain last night that was unusual but is much better, today. She denies trouble breathing or chest pain. She has had some congestion and a tickle cough, no fever or other respiratory symptoms.    HISTORY (copied from Dr AtGuadelupe Sabinrevious note)  Ms. HaTwerskys a very pleasant [age]ear old right-handed woman with an underlying medical history of hyperlipidemia, history of pituitary microadenoma, precancerous breast lesions status post double mastectomy in the 1980s, who presents for follow-up consultation of her left-sided predominant Parkinson's  disease. The patient is accompanied by her sister, Tami Vega, today. I last saw her on 02/12/2020, at which time we talked about the importance of fall prevention.  She had fallen in August 2021 and broke her proximal right humerus.  She was treated conservatively.  She was in rehab and subsequently had home health therapy.  She has ongoing stress.  She was advised to continue her Parkinson's medications.   Today, 06/10/2020: She reports having had some issues with her balance.  She has fallen about 3 times, most recently, she fell on the weekend on the way to her bed, she hit the wood floor with her forehead.  She did not lose consciousness and did not have any headache subsequently but does have some discomfort.  She had a small swelling in the left forehead and a slight remnant of it.  She reports that she has not been drinking water as well as she should.  She was told by her primary care nurse practitioner that she was dehydrated.  She had some blood work through her primary care recently, she is supposed to  see Jaclynn Major, NP again in April.  She has ongoing stress, worries about her husband, sometimes she gets frustrated.  She indicates that she continues to take her antidepressant medication.  She is currently in home health PT.  Her youngest son, lives about 5 minutes away, her older son lives about 10 minutes away.  Debbie lives about 2 to 3 minutes away and [place] checks in on her typically on Tuesdays and Thursdays.   The patient's allergies, current medications, family history, past medical history, past social history, past surgical history and problem list were reviewed and updated as appropriate.    Previously (copied from previous notes for reference):    I saw her on 10/10/2019, at which time she reported that her Sinemet was not lasting as long.  She was taking 1 pill 4 times a day.  She also had more stress what with her husband's declining health and memory loss.   I saw her on 04/12/2019, at which time she reported tolerating Comtan.  We mutually agreed to increase it to 1 pill 3 times daily.  She was advised to continue with Sinemet and Azilect and we talked about potentially utilizing inbrija potentially down the road.   I saw her on 12/07/2018, at which time she reported taking Comtan only once daily at 11 AM.  She felt that she was too sedated when she took it twice daily but she was willing to try it again.  I suggest that she added for the 3 PM dose.  We kept her Sinemet the same as well as the Azilect.    I saw her on 09/05/2018, at which time she reported feeling more slower, she had had a few times of feeling lightheaded upon standing up too quickly.  She had a fall in the kitchen.  She was taking Sinemet 4 times a day.  She was more fatigued by the end of the day.  Constipation was under control.  She had had some stable hallucinations, nothing bothersome.  I suggested a cautious addition of Comtan for 2 of her 4 doses of Sinemet.  She called back in July 2020 indicating that she  felt more unsteady since starting the Comtan and she was advised to reduce the Comtan to only once daily with 1 of her Sinemet doses.   I saw her on 03/02/2018, at which time she reported that she  was not able to tolerate the Sinemet long-acting.  She had problems with insomnia, she was no longer on it.  We had started it in or around June 2019.  She was generally taking Sinemet 4 times a day.  She was having more difficulty in the mornings in terms of mobility.  She was advised to take a middle of the night dose of Sinemet for total of 5 pills/day, the middle of the night dose anywhere between 11 PM and 4 AM.  We talked about the importance of monitoring her driving.  She had limited her driving.   I saw her on 08/30/2017, at which time she reported having had one fall in the past 6 months. She fell in the kitchen. She unfortunately cracked a couple of ribs, had an x-ray through PCP. She noticed that she was more stiffer in the mornings and had difficulty moving at times, shuffling more. We talked about the importance of fall prevention and the importance of being proactive about constipation. She was advised to hydrate well with water and continue with Sinemet 1 pill 4 times a day at 7, 11, 3 and 7. I asked her to add Sinemet CR 50-200 milligrams strength at bedtime. She was advised to continue with Azilect once daily as well.   She called a few days after that in the interim reporting that she felt more sleepy after starting the Sinemet CR. I suggested we could reduce it to 25-100 milligrams strength long-acting but she declined.   I saw her on 02/22/17, at which time she reported doing okay, sometimes she felt lightheaded. She had had a few falls, with a tendency to fall backwards when she stood up too quickly. For her occasional constipation she was using MiraLAX as needed about twice a week. I advised her to continue her current Parkinson's disease medication regimen including Sinemet 1 pill 4 times a  day and Azilect 1 pill once daily, talked about fall risk and gait safety.   I saw her on 08/18/2016, at which time she reported increase in tremor on the left side. She had some difficulty with sleep at times. She had not tried any over-the-counter medication. She had a fall about 3 months prior in the yard. Thankfully she did not sustain any major injuries. She did bruise her right thigh. I suggested she try to increase her Sinemet to 1 pill 4 times a day and keep her Azilect at 1 mg once daily. She was advised to try melatonin for sleep.   I saw her on 02/19/2016, at which time she reported overall doing quite well, she was active, sewing and making crafts. She had no recent falls but had noticed lightheadedness particularly upon standing up quickly. She had a near fall experience recently at a restaurant. She was not always hydrating well. Mood and memory were stable. Motor-wise, she felt stable as well. I suggested we continue with her medication regimen, including Azilect and Sinemet.Marland Kitchen She was furthermore advised to stay better hydrated and change positions slowly, also start using compression stockings.   I saw her on 08/12/2015, at which time she reported feeling fairly stable. She was going to the gym about 4 times a week, had lost weight, mostly because of change in taste/lack of taste. Also, she cut out sodas and has been watching what she eats, trying to drink more water. Overall, motor-wise she felt stable, had mild short-term memory issues but no mood or sinister memory issues, no recent falls. Sometimes her  left hand tremor was worse. She has 3 grown children and 3 grandchildren. She was on low-dose Lexapro. I suggested she continue with Azilect once daily and Sinemet 1 pill 3 times a day.   I saw her on 02/11/2015, at which time she reported doing better with the recent addition of Sinemet. She felt she had more energy and mobility was better as well. She needed refills on paper  prescriptions because of changing her pharmacies. Her daughter reported that she had stopped taking her antidepressant for a couple of months but her family had noticed more problems with her depression and anxiety and she restarted her medication. She was trying to stay active. She is trying to drink enough water. She did have additional stressors lately because her son was involved in a car accident and was still in the hospital at the time. Overall, however, she had done fairly well. We mutually agreed to continue her on Azilect 1 mg once daily and Sinemet 1 pill 3 times a day.   I saw her on 10/16/2014, at which time she reported that her tremor was a little worse. She also felt that her walking was worse. She had a harder time picking up her feet, particularly on the left side. Thankfully she had not fallen. She was sleeping a little better since switching her antidepressant to nighttime. I asked her to continue with Azilect once daily but also to add a small dose of Sinemet starting with half a pill twice daily with gradual titration to 1 pill 3 times a day.   I saw her on 04/17/2014, at which time she reported doing a little better with regards to her tremor. She noticed a flareup of her tremor with nervousness. She was still able to use her sewing machine. She had no side effects from her medications. She did find out that she had 2 female cousins on her father's side with Parkinson's disease. I asked her to continue Azilect 1 mg once daily.   I first met her on 10/20/2013 at the request of her primary care physician, at which time she gave a 6-12 month history of left upper extremity tremor. She also reported problems with fine motor skills on the left. Her history and physical exam are in keeping with mild parkinsonism, probably left eye predominant Parkinson's disease. I suggested further workup with a brain MRI and starting her on Azilect. She did not have a brain scan done.   She has noted L sided  issues with fine motor skills, resting tremor on the LUE, she feels easily fatigued and it is difficult for her to pick up her L leg sometimes. Thankfully, she has not fallen, but her balance is off at times. She goes to [employer] 4 times a week, but has less stamina. She is a life long non-smoker, does not drink alcohol and has no history of head injury, exposure to pesticides, but did work in a Emergency planning/management officer for 33 years and she [place]. She has no FHx of PD or tremors.    REVIEW OF SYSTEMS: Out of a complete 14 system review of symptoms, the patient complains only of the following symptoms, fatigue, left hand tremor, imbalance, shoulder pain and all other reviewed systems are negative.   ALLERGIES: No Known Allergies   HOME MEDICATIONS: Outpatient Medications Prior to Visit  Medication Sig Dispense Refill   b complex vitamins tablet Take 1 tablet by mouth daily.     Calcium Carbonate-Vit D-Min (  CALCIUM 1200 PO) Take 1 tablet by mouth daily. Take one tablet a day     carbidopa-levodopa (SINEMET IR) 25-100 MG tablet Take 1 tablet by mouth 6 (six) times daily. Take at 4 AM, 7 AM, 10 AM, 1 PM, 4 PM and  7 PM 540 tablet 3   cholecalciferol (VITAMIN D) 25 MCG tablet Take 1 tablet (1,000 Units total) by mouth daily. 30 tablet    D-Mannose 500 MG CAPS Take 1 Capful by mouth in the morning and at bedtime.     escitalopram (LEXAPRO) 20 MG tablet Take 10 mg by mouth 2 (two) times daily.  4   fish oil-omega-3 fatty acids 1000 MG capsule Take 1 g by mouth daily.     levothyroxine (SYNTHROID) 75 MCG tablet Take 75 mcg by mouth every morning.     OMEPRAZOLE PO Take 20 mg by mouth daily.     polyethylene glycol (MIRALAX / GLYCOLAX) 17 g packet Take 17 g by mouth daily as needed for mild constipation.     Probiotic Product (PROBIOTIC PO) Take 1 capsule by mouth daily.     rasagiline (AZILECT) 1 MG TABS tablet Take 1 tablet (1 mg total) by mouth daily. 90 tablet 3   No facility-administered  medications prior to visit.     PAST MEDICAL HISTORY: Past Medical History:  Diagnosis Date   Abnormal involuntary movements(781.0)    Disorder of bone and cartilage, unspecified    Elevated blood pressure reading without diagnosis of hypertension    Major depressive disorder, single episode, unspecified    Other musculoskeletal symptoms referable to limbs(729.89)    Pain in joint, lower leg    Pure hypercholesterolemia      PAST SURGICAL HISTORY: Past Surgical History:  Procedure Laterality Date   BREAST SURGERY  1980's   benign cyst   COLONOSCOPY  1995, 1998   VAGINAL HYSTERECTOMY  1975     FAMILY HISTORY: Family History  Problem Relation Age of Onset   CAD Mother    Breast cancer Mother    CAD Father    Parkinson's disease Father    Tremor Sister    Parkinson's disease Sister    Hyperlipidemia Sister    Hypertension Sister    CAD Brother    Breast cancer Maternal Aunt    Colon cancer Maternal Aunt      SOCIAL HISTORY: Social History   Socioeconomic History   Marital status: Married    Spouse name: Not on file   Number of children: Not on file   Years of education: Not on file   Highest education level: Not on file  Occupational History   Not on file  Tobacco Use   Smoking status: Never   Smokeless tobacco: Never  Substance and Sexual Activity   Alcohol use: No    Alcohol/week: 0.0 standard drinks of alcohol   Drug use: No   Sexual activity: Not on file  Other Topics Concern   Not on file  Social History Narrative   Not on file   Social Determinants of Health   Financial Resource Strain: Not on file  Food Insecurity: Not on file  Transportation Needs: Not on file  Physical Activity: Not on file  Stress: Not on file  Social Connections: Not on file  Intimate Partner Violence: Not on file     PHYSICAL EXAM  There were no vitals filed for this visit.   There is no height or weight on file to calculate BMI.  Generalized: Well  developed, in no acute distress  Cardiology: normal rate and rhythm, no murmur auscultated  Respiratory: clear to auscultation bilaterally    Neurological examination  Mentation: Alert oriented to time, place, history taking. Follows all commands speech and language fluent but hypophonic Cranial nerve II-XII: Pupils were equal round reactive to light. Extraocular movements show saccadic breakdown. No nystagmus.  visual field were full on confrontational test. Facial sensation and strength were normal. Uvula tongue midline. Head turning and shoulder shrug  were normal and symmetric. Motor: The motor testing reveals 5 over 5 strength of all 4 extremities. Tone is more rigid with left upper extremity cogwheeling. Mild bradykinesia noted. Left upper ext resting tremor. Generalized dyskinesias noted of head and trunk, today.  Sensory: Sensory testing is intact to soft touch on all 4 extremities. No evidence of extinction is noted.  Coordination: Cerebellar testing reveals good finger-nose-finger and heel-to-shin bilaterally.  Gait and station: Gait is stable with walker. Tandem not attempted. Balance with turns mildly impaired.   Reflexes: Deep tendon reflexes are symmetric and normal bilaterally.    DIAGNOSTIC DATA (LABS, IMAGING, TESTING) - I reviewed patient records, labs, notes, testing and imaging myself where available.  Lab Results  Component Value Date   WBC 5.5 10/08/2021   HGB 12.1 10/08/2021   HCT 35.2 (L) 10/08/2021   MCV 90.0 10/08/2021   PLT 145 (L) 10/08/2021      Component Value Date/Time   NA 142 10/08/2021 0558   K 3.5 10/08/2021 0558   CL 113 (H) 10/08/2021 0558   CO2 22 10/08/2021 0558   GLUCOSE 94 10/08/2021 0558   BUN 18 10/08/2021 0558   CREATININE 0.49 10/08/2021 0558   CALCIUM 8.2 (L) 10/08/2021 0558   PROT 5.7 (L) 10/08/2021 0558   ALBUMIN 3.3 (L) 10/08/2021 0558   AST 12 (L) 10/08/2021 0558   ALT 10 10/08/2021 0558   ALKPHOS 40 10/08/2021 0558   BILITOT  1.2 10/08/2021 0558   GFRNONAA >60 10/08/2021 0558   GFRAA >60 11/07/2019 0508   No results found for: "CHOL", "HDL", "LDLCALC", "LDLDIRECT", "TRIG", "CHOLHDL" No results found for: "HGBA1C" No results found for: "VITAMINB12" Lab Results  Component Value Date   TSH 1.608 11/02/2019        No data to display               No data to display           ASSESSMENT AND PLAN  81 y.o. year old female  has a past medical history of Abnormal involuntary movements(781.0), Disorder of bone and cartilage, unspecified, Elevated blood pressure reading without diagnosis of hypertension, Major depressive disorder, single episode, unspecified, Other musculoskeletal symptoms referable to limbs(729.89), Pain in joint, lower leg, and Pure hypercholesterolemia. here with    No diagnosis found.  Janyla is doing fairly well from a neurological perspective. Parkinson symptoms are stable on current treatment, however, she does report feeling that she needs to take Sinemet dose a little early 2-3 times a week. We will continue generic Sinemet 1 tablet five times daily, generic Azilect 33m daily and generic Comtan 2026mTID. I will add Parcopa 25-10032making 1/2 tablet 1-2 times daily as needed. I will also send orders for home PT for gait assessment and strengthening. She was encouraged to continue healthy lifestyle habits. Adequate hydration discussed. Fall precautions reviewed. She and her son, SteRemo Lippsverbalize understanding. I will alternate her care with Dr AthRexene Albertsd have [place] return [place]  in 6 months. She verbalizes understanding and agreement with this plan.    No orders of the defined types were placed in this encounter.     No orders of the defined types were placed in this encounter.   I spent 30 minutes of face-to-face and non-face-to-face time with patient.  This included previsit chart review, lab review, study review, order entry, electronic health record documentation, patient  education.   Debbora Presto, MSN, FNP-C 02/16/2022, 12:20 PM  [facility] [address] [place], [place] [address] [phone]

## 2022-02-16 NOTE — Patient Instructions (Signed)
Below is our plan:  We will continue carbidopa-levodopa 1 tablet 6 times daily and Azilect 1mg  daily. It is best to take carbidopa-levodopa as consistently as possible at 5a, 8a, 11p, 2p, 5p and 8p. Please monitor closely for worsening depression. Stay well hydrated. Try to stay as active, both mentally and physically, as possible.   Please make sure you are staying well hydrated. I recommend 50-60 ounces daily. Well balanced diet and regular exercise encouraged. Consistent sleep schedule with 6-8 hours recommended.   Please continue follow up with care team as directed.   Follow up with Dr Frances Furbish in 6 months   You may receive a survey regarding today's visit. I encourage you to leave honest feed back as I do use this information to improve patient care. Thank you for seeing me today!

## 2022-02-17 ENCOUNTER — Ambulatory Visit: Payer: Medicare HMO | Admitting: Family Medicine

## 2022-02-17 ENCOUNTER — Encounter: Payer: Self-pay | Admitting: Family Medicine

## 2022-02-17 VITALS — BP 109/71 | HR 107 | Ht 67.0 in | Wt 161.0 lb

## 2022-02-17 DIAGNOSIS — R293 Abnormal posture: Secondary | ICD-10-CM | POA: Diagnosis not present

## 2022-02-17 DIAGNOSIS — Z9181 History of falling: Secondary | ICD-10-CM | POA: Diagnosis not present

## 2022-02-17 DIAGNOSIS — I951 Orthostatic hypotension: Secondary | ICD-10-CM

## 2022-02-17 DIAGNOSIS — G20A2 Parkinson's disease without dyskinesia, with fluctuations: Secondary | ICD-10-CM

## 2022-02-17 DIAGNOSIS — G20B1 Parkinson's disease with dyskinesia, without mention of fluctuations: Secondary | ICD-10-CM | POA: Diagnosis not present

## 2022-02-17 DIAGNOSIS — J9610 Chronic respiratory failure, unspecified whether with hypoxia or hypercapnia: Secondary | ICD-10-CM | POA: Diagnosis not present

## 2022-02-17 DIAGNOSIS — F439 Reaction to severe stress, unspecified: Secondary | ICD-10-CM

## 2022-02-17 DIAGNOSIS — R2681 Unsteadiness on feet: Secondary | ICD-10-CM | POA: Diagnosis not present

## 2022-02-17 DIAGNOSIS — M6281 Muscle weakness (generalized): Secondary | ICD-10-CM | POA: Diagnosis not present

## 2022-02-17 DIAGNOSIS — R69 Illness, unspecified: Secondary | ICD-10-CM | POA: Diagnosis not present

## 2022-02-18 DIAGNOSIS — M6281 Muscle weakness (generalized): Secondary | ICD-10-CM | POA: Diagnosis not present

## 2022-02-18 DIAGNOSIS — J9610 Chronic respiratory failure, unspecified whether with hypoxia or hypercapnia: Secondary | ICD-10-CM | POA: Diagnosis not present

## 2022-02-18 DIAGNOSIS — R2681 Unsteadiness on feet: Secondary | ICD-10-CM | POA: Diagnosis not present

## 2022-02-20 DIAGNOSIS — J9610 Chronic respiratory failure, unspecified whether with hypoxia or hypercapnia: Secondary | ICD-10-CM | POA: Diagnosis not present

## 2022-02-20 DIAGNOSIS — R2681 Unsteadiness on feet: Secondary | ICD-10-CM | POA: Diagnosis not present

## 2022-02-20 DIAGNOSIS — M6281 Muscle weakness (generalized): Secondary | ICD-10-CM | POA: Diagnosis not present

## 2022-02-21 DIAGNOSIS — J9610 Chronic respiratory failure, unspecified whether with hypoxia or hypercapnia: Secondary | ICD-10-CM | POA: Diagnosis not present

## 2022-02-21 DIAGNOSIS — M6281 Muscle weakness (generalized): Secondary | ICD-10-CM | POA: Diagnosis not present

## 2022-02-21 DIAGNOSIS — R2681 Unsteadiness on feet: Secondary | ICD-10-CM | POA: Diagnosis not present

## 2022-02-25 ENCOUNTER — Ambulatory Visit: Payer: Medicare HMO | Admitting: Neurology

## 2022-02-25 DIAGNOSIS — E7849 Other hyperlipidemia: Secondary | ICD-10-CM | POA: Diagnosis not present

## 2022-02-25 DIAGNOSIS — M6259 Muscle wasting and atrophy, not elsewhere classified, multiple sites: Secondary | ICD-10-CM | POA: Diagnosis not present

## 2022-03-16 ENCOUNTER — Ambulatory Visit (INDEPENDENT_AMBULATORY_CARE_PROVIDER_SITE_OTHER): Payer: Medicare Other | Admitting: Podiatry

## 2022-03-16 DIAGNOSIS — B351 Tinea unguium: Secondary | ICD-10-CM

## 2022-03-16 DIAGNOSIS — M79675 Pain in left toe(s): Secondary | ICD-10-CM | POA: Diagnosis not present

## 2022-03-16 DIAGNOSIS — M79674 Pain in right toe(s): Secondary | ICD-10-CM | POA: Diagnosis not present

## 2022-03-16 NOTE — Progress Notes (Signed)
Subjective:  Patient ID: Tami Vega, female    DOB: 10/30/1940,  MRN: [id number]  Chief Complaint  Patient presents with   Nail Problem    Routine Foot Care     81 y.o. female presents with the above complaint. History confirmed with patient. Patient presenting with pain related to dystrophic thickened elongated nails. Patient is unable to trim own nails related to nail dystrophy and/or mobility issues. Patient does not have a history of T2DM.   Objective:  Physical Exam: warm, good capillary refill nail exam onychomycosis of the toenails, onycholysis, and dystrophic nails DP pulses palpable, PT pulses palpable, and protective sensation intact Left Foot:  Pain with palpation of nails due to elongation and dystrophic growth.  Right Foot: Pain with palpation of nails due to elongation and dystrophic growth.   Assessment:   1. Pain due to onychomycosis of toenails of both feet      Plan:  Patient was evaluated and treated and all questions answered.  #Onychomycosis with pain  -Nails palliatively debrided as below. -Educated on self-care - Defer treatment with antifungals and instead proceed with routine care.   Procedure: Nail Debridement Rationale: Pain Type of Debridement: manual, sharp debridement. Instrumentation: Nail nipper, rotary burr. Number of Nails: 10  Return in about 3 months (around 06/15/2022) for RFC.         Corinna Gab, DPM [facility] / [facility]

## 2022-04-21 ENCOUNTER — Telehealth: Payer: Self-pay | Admitting: Neurology

## 2022-04-21 NOTE — Telephone Encounter (Signed)
I called # left and was not correct # to person I was looking for.  I called # on pts chart.   Her son, steve picked up.  He relayed issue.  The pt has anxiety when her sinemet is due (lasting 2.5 hours vs every 3 hours).  She is taking 25/100 tabs every 3 hours.  She was taken off the comtan for syncope spell (low Bp) and midodrine.   He could not say if this has helped her Bp, but is asking if this could be reconsidered for pt.  She is very on top of getting her medication every 3 hours, but living at a facility shift changes that is not always possible, she gets anxious then feels like she needs more).   So this is the reason for call and request. [phone].  I called and spoke to [facility], at nurse station 1.  Pt has been started on buspar 5mg  po bid (starting this evening) for anxiety.    Did you want to make change?  Has appt in 05/2022 for follow up?

## 2022-04-21 NOTE — Telephone Encounter (Signed)
Called Sherry and relayed that no changes recommended per Dr. Rexene Alberts at this time.  Buspar  5mg  will be given twice daily for anxiety once approved by provider at facility.  Judeen Hammans verbalized understanding.

## 2022-04-21 NOTE — Telephone Encounter (Signed)
No change recommended at this time, thank you for talking to them and for the updates.

## 2022-04-21 NOTE — Telephone Encounter (Signed)
Derrick Ravel is calling. Stated she wants to talk to nurse or provider about adjusting carbidopa-levodopa (SINEMET IR) 25-100 MG tablet. Stated if she is not available please talk to nurse at station 1

## 2022-04-27 ENCOUNTER — Ambulatory Visit (INDEPENDENT_AMBULATORY_CARE_PROVIDER_SITE_OTHER): Payer: Medicare Other | Admitting: Neurology

## 2022-04-27 ENCOUNTER — Encounter: Payer: Self-pay | Admitting: Neurology

## 2022-04-27 VITALS — BP 136/76 | HR 75 | Ht 67.75 in | Wt 167.0 lb

## 2022-04-27 DIAGNOSIS — G20B1 Parkinson's disease with dyskinesia, without mention of fluctuations: Secondary | ICD-10-CM | POA: Diagnosis not present

## 2022-04-27 DIAGNOSIS — I951 Orthostatic hypotension: Secondary | ICD-10-CM | POA: Diagnosis not present

## 2022-04-27 DIAGNOSIS — G20A2 Parkinson's disease without dyskinesia, with fluctuations: Secondary | ICD-10-CM

## 2022-04-27 NOTE — Progress Notes (Signed)
Subjective:    Patient ID: Tami Vega is a 82 y.o. female.  HPI    Interim history:   Tami Vega is a very pleasant 82 year old right-handed woman with an underlying medical history of hyperlipidemia, history of pituitary microadenoma, precancerous breast lesions status post double mastectomy in the 1980s, who presents for follow-up consultation of her left-sided predominant Parkinson's disease, complicated by orthostatic hypotension, anxiety, and sleep disturbance.  The patient is accompanied by her son, Tami Vega, today.  I last saw her on 10/23/2021, at which time she was advised to increase her Sinemet to 1 pill 6 times a day and discontinue Comtan due to blood pressure dysregulation.  She was advised to continue with Azilect.  She saw Debbora Presto, NP on 02/17/2022, at which time she was advised to continue with her medication regimen.   Today, 04/27/2022: She reports doing all right, hanging in there, does get anxious when she does not get her Sinemet on time, she is currently at [facility] and rehab in [place] in skilled nursing and they have a certain timeframe during which they can administer medications.  She has not fallen recently thankfully.  She is careful, she uses a walker at all times.  She has not had any significantly low blood pressure values and no recent hallucinations per son and patient.  She is not sure if she is currently taking the BuSpar, her son is not fully sure either. She has had increase in anxiety.  She has been started on BuSpar per PCP.  She is on levodopa 1 pill 6 times a day.  She is on Azilect once daily, she is no longer on entacapone and her son is wondering if she should be restarted on entacapone.  I explained to them that increasing the frequency of levodopa or adding entacapone may increase her risk for orthostatic hypotension, hallucinations, and dyskinesias.  The patient's allergies, current medications, family history, past medical history, past  social history, past surgical history and problem list were reviewed and updated as appropriate.   Previously (copied from previous notes for reference):       I saw her on 04/15/2021, at which time she was advised to continue with Sinemet 5 times a day, entacapone 3 times a day and Azilect once daily.  She was on Lexapro 10 mg daily.  She was advised to follow-up with her primary care to optimize treatment for anxiety and stress.  She saw Debbora Presto, NP in the interim on 08/20/2021, at which time she was advised to add Parcopa half a tablet up to twice daily as needed for additional help with her regular levodopa wearing off.        I saw her on 06/10/2020, at which time she reported a recent fall.  She did hit her head.  She was not hydrating as well as she should and had been told by her primary care that she was dehydrated.  She reported ongoing stress and worry about her husband.  She was in home health physical therapy.  She was advised to continue with Sinemet 1 pill 5 times a day, Azilect once daily and entacapone 1 pill 3 times a day.  She was advised to hydrate better.  Since she had sustained a fall with head injury, I advised her to get a head CT done.  She had a normal head CT through [facility] in [place] on 06/13/2020 and I reviewed the results: Impression: Mildly motion degraded exam.  No evidence of acute  intracranial abnormality.  Left forehead soft tissue swelling.  Mild cerebral atrophy.  1.3 x 0.9 cm nonspecific polypoid soft tissue fullness expanding the right sphenoethmoidal recess, stable as compared to the head CT of 11/02/2019.  Consider ENT consultation as clinically warranted.   She saw Shawnie DapperAmy Lomax, NP in the interim on 10/14/2020, at which time she reported no recent falls.  She was advised to continue with her medication regimen.     I saw her on 02/12/2020, at which time we talked about the importance of fall prevention.  She had fallen in August 2021 and broke her proximal  right humerus.  She was treated conservatively.  She was in rehab and subsequently had home health therapy.  She has ongoing stress.  She was advised to continue her Parkinson's medications.     I saw her on 10/10/2019, at which time she reported that her Sinemet was not lasting as long.  She was taking 1 pill 4 times a day.  She also had more stress what with her husband's declining health and memory loss.      I saw her on 04/12/2019, at which time she reported tolerating Comtan.  We mutually agreed to increase it to 1 pill 3 times daily.  She was advised to continue with Sinemet and Azilect and we talked about potentially utilizing inbrija potentially down the road.         I saw her on 12/07/2018, at which time she reported taking Comtan only once daily at 11 AM.  She felt that she was too sedated when she took it twice daily but she was willing to try it again.  I suggest that she added for the 3 PM dose.  We kept her Sinemet the same as well as the Azilect.          I saw her on 09/05/2018, at which time she reported feeling more slower, she had had a few times of feeling lightheaded upon standing up too quickly.  She had a fall in the kitchen.  She was taking Sinemet 4 times a day.  She was more fatigued by the end of the day.  Constipation was under control.  She had had some stable hallucinations, nothing bothersome.  I suggested a cautious addition of Comtan for 2 of her 4 doses of Sinemet.  She called back in July 2020 indicating that she felt more unsteady since starting the Comtan and she was advised to reduce the Comtan to only once daily with 1 of her Sinemet doses.   I saw her on 03/02/2018, at which time she reported that she was not able to tolerate the Sinemet long-acting.  She had problems with insomnia, she was no longer on it.  We had started it in or around June 2019.  She was generally taking Sinemet 4 times a day.  She was having more difficulty in the mornings in terms of  mobility.  She was advised to take a middle of the night dose of Sinemet for total of 5 pills/day, the middle of the night dose anywhere between 11 PM and 4 AM.  We talked about the importance of monitoring her driving.  She had limited her driving.     I saw her on 08/30/2017, at which time she reported having had one fall in the past 6 months. She fell in the kitchen. She unfortunately cracked a couple of ribs, had an x-ray through PCP. She noticed that she was more stiffer in the  mornings and had difficulty moving at times, shuffling more. We talked about the importance of fall prevention and the importance of being proactive about constipation. She was advised to hydrate well with water and continue with Sinemet 1 pill 4 times a day at 7, 11, 3 and 7. I asked her to add Sinemet CR 50-200 milligrams strength at bedtime. She was advised to continue with Azilect once daily as well.   She called a few days after that in the interim reporting that she felt more sleepy after starting the Sinemet CR. I suggested we could reduce it to 25-100 milligrams strength long-acting but she declined.   I saw her on 02/22/17, at which time she reported doing okay, sometimes she felt lightheaded. She had had a few falls, with a tendency to fall backwards when she stood up too quickly. For her occasional constipation she was using MiraLAX as needed about twice a week. I advised her to continue her current Parkinson's disease medication regimen including Sinemet 1 pill 4 times a day and Azilect 1 pill once daily, talked about fall risk and gait safety.   I saw her on 08/18/2016, at which time she reported increase in tremor on the left side. She had some difficulty with sleep at times. She had not tried any over-the-counter medication. She had a fall about 3 months prior in the yard. Thankfully she did not sustain any major injuries. She did bruise her right thigh. I suggested she try to increase her Sinemet to 1 pill 4  times a day and keep her Azilect at 1 mg once daily. She was advised to try melatonin for sleep.   I saw her on 02/19/2016, at which time she reported overall doing quite well, she was active, sewing and making crafts. She had no recent falls but had noticed lightheadedness particularly upon standing up quickly. She had a near fall experience recently at a restaurant. She was not always hydrating well. Mood and memory were stable. Motor-wise, she felt stable as well. I suggested we continue with her medication regimen, including Azilect and Sinemet.Marland Kitchen She was furthermore advised to stay better hydrated and change positions slowly, also start using compression stockings.   I saw her on 08/12/2015, at which time she reported feeling fairly stable. She was going to the gym about 4 times a week, had lost weight, mostly because of change in taste/lack of taste. Also, she cut out sodas and has been watching what she eats, trying to drink more water. Overall, motor-wise she felt stable, had mild short-term memory issues but no mood or sinister memory issues, no recent falls. Sometimes her left hand tremor was worse. She has 3 grown children and 3 grandchildren. She was on low-dose Lexapro. I suggested she continue with Azilect once daily and Sinemet 1 pill 3 times a day.   I saw her on 02/11/2015, at which time she reported doing better with the recent addition of Sinemet. She felt she had more energy and mobility was better as well. She needed refills on paper prescriptions because of changing her pharmacies. Her daughter reported that she had stopped taking her antidepressant for a couple of months but her family had noticed more problems with her depression and anxiety and she restarted her medication. She was trying to stay active. She is trying to drink enough water. She did have additional stressors lately because her son was involved in a car accident and was still in the hospital at the time. Overall,  however, she had done fairly well. We mutually agreed to continue her on Azilect 1 mg once daily and Sinemet 1 pill 3 times a day.   I saw her on 10/16/2014, at which time she reported that her tremor was a little worse. She also felt that her walking was worse. She had a harder time picking up her feet, particularly on the left side. Thankfully she had not fallen. She was sleeping a little better since switching her antidepressant to nighttime. I asked her to continue with Azilect once daily but also to add a small dose of Sinemet starting with half a pill twice daily with gradual titration to 1 pill 3 times a day.    I saw her on 04/17/2014, at which time she reported doing a little better with regards to her tremor. She noticed a flareup of her tremor with nervousness. She was still able to use her sewing machine. She had no side effects from her medications. She did find out that she had 2 female cousins on her father's side with Parkinson's disease. I asked her to continue Azilect 1 mg once daily.   I first met her on 10/20/2013 at the request of her primary care physician, at which time she gave a 6-12 month history of left upper extremity tremor. She also reported problems with fine motor skills on the left. Her history and physical exam are in keeping with mild parkinsonism, probably left eye predominant Parkinson's disease. I suggested further workup with a brain MRI and starting her on Azilect. She did not have a brain scan done.    She has noted L sided issues with fine motor skills, resting tremor on the LUE, she feels easily fatigued and it is difficult for her to pick up her L leg sometimes. Thankfully, she has not fallen, but her balance is off at times. She goes to [employer] 4 times a week, but has less stamina. She is a life long non-smoker, does not drink alcohol and has no history of head injury, exposure to pesticides, but did work in a [employer], therapeutic for 33 years and [place]. She  has no FHx of PD or tremors.       Her Past Medical History Is Significant For: Past Medical History:  Diagnosis Date   Abnormal involuntary movements(781.0)    Disorder of bone and cartilage, unspecified    Elevated blood pressure reading without diagnosis of hypertension    Major depressive disorder, single episode, unspecified    Other musculoskeletal symptoms referable to limbs(729.89)    Pain in joint, lower leg    Pure hypercholesterolemia     Her Past Surgical History Is Significant For: Past Surgical History:  Procedure Laterality Date   BREAST SURGERY  1980's   benign cyst   COLONOSCOPY  1995, 1998   VAGINAL HYSTERECTOMY  1975    Her Family History Is Significant For: Family History  Problem Relation Age of Onset   CAD Mother    Breast cancer Mother    CAD Father    Parkinson's disease Father    Tremor Sister    Parkinson's disease Sister    Hypertension Sister    Hypertension Sister    CAD Sister    CAD Brother    Breast cancer Maternal Aunt    Colon cancer Maternal Aunt     Her Social History Is Significant For: Social History   Socioeconomic History   Marital status: Married  Spouse name: Not on file   Number of children: Not on file   Years of education: Not on file   Highest education level: Not on file  Occupational History   Not on file  Tobacco Use   Smoking status: Never   Smokeless tobacco: Never  Substance and Sexual Activity   Alcohol use: No    Alcohol/week: 0.0 standard drinks of alcohol   Drug use: No   Sexual activity: Not on file  Other Topics Concern   Not on file  Social History Narrative   Lives in assisted living at [facility]   Right handed    Caffeine: coffee 1 cup/day   Social Determinants of Health   Financial Resource Strain: Not on file  Food Insecurity: Not on file  Transportation Needs: Not on file  Physical Activity: Not on file  Stress: Not on file  Social Connections: Not on file     Her Allergies Are:  No Known Allergies:   Her Current Medications Are:  Outpatient Encounter Medications as of 04/27/2022  Medication Sig   b complex vitamins tablet Take 1 tablet by mouth daily.   Calcium Carbonate-Vit D-Min (CALCIUM 1200 PO) Take 1 tablet by mouth daily. Take one tablet a day   carbidopa-levodopa (SINEMET IR) 25-100 MG tablet Take 1 tablet by mouth 6 (six) times daily. Take at 4 AM, 7 AM, 10 AM, 1 PM, 4 PM and  7 PM (Patient taking differently: Take 1 tablet by mouth 6 (six) times daily. Take at 5 AM, 8 AM, 11 AM, 2 PM, 5 PM and  8 PM)   cholecalciferol (VITAMIN D) 25 MCG tablet Take 1 tablet (1,000 Units total) by mouth daily.   D-Mannose 500 MG CAPS Take 1 Capful by mouth in the morning and at bedtime.   escitalopram (LEXAPRO) 20 MG tablet Take 20 mg by mouth daily.   fish oil-omega-3 fatty acids 1000 MG capsule Take 1 g by mouth daily.   levothyroxine (SYNTHROID) 75 MCG tablet Take 75 mcg by mouth every morning.   OMEPRAZOLE PO Take 20 mg by mouth daily.   polyethylene glycol (MIRALAX / GLYCOLAX) 17 g packet Take 17 g by mouth daily as needed for mild constipation.   Probiotic Product (PROBIOTIC PO) Take 1 capsule by mouth daily.   rasagiline (AZILECT) 1 MG TABS tablet Take 1 tablet (1 mg total) by mouth daily.   No facility-administered encounter medications on file as of 04/27/2022.  :  Review of Systems:  Out of a complete 14 point review of systems, all are reviewed and negative with the exception of these symptoms as listed below:  Review of Systems  Neurological:        Patient is here with son for follow-up. She saw Tami Vega in November. The patient was having some dips in BP and she was taken off Entacapone. However now she has anxiety when the sinemet wears off. Her son is asking if its possible to add back in the Entacapone.     Objective:  Neurological Exam  Physical Exam Physical Examination:   Vitals:   04/27/22 1012  BP: 136/76  Pulse: 75     General Examination: The patient is a very pleasant 82 y.o. female in no acute distress. She appears well-developed and well-nourished and well groomed.   HEENT: Normocephalic, atraumatic, pupils are equal, round and reactive to light. Corrective eyeglasses in place, bilateral cataracts noted. Extraocular tracking shows saccadic breakdown without nystagmus noted.  There  is decrease in eye blink rate. Hearing is impaired. Face is symmetric with moderate facial masking and normal facial sensation. There is no lip, neck or jaw tremor. Neck is moderately rigid with decreased ROM and stooped neck, stable. Oropharynx exam reveals mild to moderate mouth dryness. No significant airway crowding is noted. Mallampati is class II. Tongue protrudes centrally and palate elevates symmetrically. There is no obvious sialorrhea.      Chest: is clear to auscultation without wheezing, rhonchi or crackles noted.   Heart: sounds are regular and normal without murmurs, rubs or gallops noted.    Abdomen: is soft, non-tender and non-distended.   Extremities: There is trace puffiness in the distal lower extremities bilaterally.      Skin: is warm and dry with no trophic changes noted. Age-related changes are noted on the skin.   Musculoskeletal: exam reveals no obvious joint deformities.   Neurologically:  Mental status: The patient is awake and alert, paying good attention, at times slower to respond, speech is hypophonic, memory mildly impaired, her son gives details of her history, mild bradyphrenia.  Slight dysarthria noted at times. Affect normal and stable.  Cranial nerves are as described above under HEENT exam.   Motor exam: Thin bulk, global strength of 4 out of 5, mild generalized dyskinesias noted.  Tone is rigid with cogwheeling in both upper extremities.  There is overall moderate bradykinesia. There is no drift or rebound.  There is a mild to moderate resting tremor in the left upper extremity. Romberg  is not tested d/t safety concern. Fine motor skills exam: moderately impaired on the right and moderate to significantly impaired on the left.    Cerebellar testing shows no dysmetria or intention tremor on finger to nose testing. there is no truncal or gait ataxia.    Sensory exam is intact to light touch in the upper and lower extremities.    Gait, station and balance: She stands up from the seated position with mild difficulty, requires no assistance, she walks with her walker.  Balance is mildly impaired.    Assessment and Plan:    In summary, Tami Vega is a very pleasant 82 year old female with an underlying medical history of hyperlipidemia, history of pituitary microadenoma, precancerous breast lesions (with s/p double mastectomy in the 1980s), who presents for follow-up consultation of her left-sided predominant Parkinson's disease with symptoms dating back to about 7-8 years ago. She has had progression over time, significantly more notable progression in the past 2+ years.  She has had falls.  She had a fall in August 2021 and broke her arm.  She has also developed dyskinesias and intermittent mild visual hallucinations, as well as more significant constipation and orthostatic hypotension with a syncopal event in 2023. She has had increase in stress what with her husband's medical issues and memory loss.  Her other son is currently taking care of of her husband, her daughter Olegario MessierKathy is also involved in their care.  She is currently in SNF and has had increase in anxiety and difficulty getting her 3 hourly levodopa on time.  She has an older brother and 2 younger sisters.  Her son, Viviann SpareSteven, is power of attorney, her son, Clovis RileyMitchell, lives with them (at home).  She started C/L in July 2016. She is also on Azilect 1 mg once daily, started this in July 2015. Sinemet was increased in 5/18 to 1 pill 4 times a day. She has had a fall with injury to the ribs. For  depression and anxiety she has been  maintained on Lexapro, currently at 20 mg daily.  We increased her Sinemet to 1 pill 5 times daily in June 2021.  She was taken off Comtan in July 2023.  She is advised to continue with Sinemet 1 pill 6 times a day.  She is advised to use her walker at all times.  We talked about the importance of fall prevention.  She is advised to continue with good hydration with water and use MiraLAX as needed for constipation.  I recommend a follow-up to see one of our nurse practitioners in about 4 months, sooner if needed.  I answered all their questions today and the patient and her son were in agreement.

## 2022-04-27 NOTE — Patient Instructions (Signed)
It was nice to see you both again today.  I would recommend you continue with levodopa 1 pill 6 times a day.  I would not recommend adding entacapone due to increased risk of low blood pressure, involuntary movements called dyskinesias, and risk for hallucinations.  Please continue to use your walker at all times and stay well-hydrated with water, be proactive about constipation issues.

## 2022-05-27 ENCOUNTER — Telehealth: Payer: Self-pay | Admitting: Neurology

## 2022-05-27 NOTE — Telephone Encounter (Signed)
I would not recommend increasing the levodopa, she is at risk for side effects including hypotension, hallucinations and dyskinesias, see discussion during our visit in January 2024.

## 2022-05-27 NOTE — Telephone Encounter (Signed)
Received a call from [facility]. Their unit manager is asking to receive a call back to speak with someone regarding Ms. Hacketts medications.   Best call back number is [phone] and ask for [facility].

## 2022-05-27 NOTE — Telephone Encounter (Signed)
I called and spoke to Tami Sievert, LPN of pt  I relayed the message to her from Dr. Rexene Alberts and also relayed that her last note 04-27-2022.    I would recommend you continue with levodopa 1 pill 6 times a day.  I would not recommend adding entacapone due to increased risk of low blood pressure, involuntary movements called dyskinesias, and risk for hallucinations.  Please continue to use your walker at all times and stay well-hydrated with water, be proactive about constipation issues.

## 2022-05-27 NOTE — Telephone Encounter (Signed)
Received call from Caryl Pina, [place] at [facility] where patient is in long term care. Stated patient is "locking up" multiple times a day, still taking Sinemet 6x a day but it is wearing off after 2 hours. Per RN-son would like to know if there can be medication adjustments made without coming in for another appointment. Please Advise. Ashley's call back number is [phone].

## 2022-05-28 NOTE — Telephone Encounter (Signed)
I called and LMVM for [facility], that per Dr. Rexene Alberts that can increase her schedule for carbidopa/levadopa 1 tablet 6 times a day , every 2.5 hours starting at 0500.  I faxed her also the ofv note and the phone message that has the order per Dr. Rexene Alberts.  She is to call back if needed.

## 2022-05-28 NOTE — Telephone Encounter (Signed)
I recommend that they change her schedule for Sinemet to one pill 6 times a day, every 2.5 hours, starting at 5 AM.

## 2022-05-28 NOTE — Telephone Encounter (Signed)
I called unit manager back.  I relayed that I spoke to LPN yesterday and relayed that per Dr. Rexene Alberts she did not want to change anything on pt due to safety (increased hypotension, hallucinations and dyskinesias).  She said that pt freezes prior to her  1100-1400- and 1700 doses when she is most active. (She is not able to feed /eat due to this).  The facility Dr. Theda Sers said that we needed to address since we are the specialist for her condition.   She takes her CL 25/100 08-04-09-1400-1700-2000.   I did fax over the last ov note to them so they are aware of this exact request from son of pt and was discussed at length.   She mentioned taking additional 1/2 tab prior to those times that she is most active.  Please advise again.

## 2022-06-22 ENCOUNTER — Ambulatory Visit (INDEPENDENT_AMBULATORY_CARE_PROVIDER_SITE_OTHER): Payer: Medicare Other | Admitting: Podiatry

## 2022-06-22 ENCOUNTER — Ambulatory Visit: Payer: Medicare HMO | Admitting: Neurology

## 2022-06-22 DIAGNOSIS — M79675 Pain in left toe(s): Secondary | ICD-10-CM

## 2022-06-22 DIAGNOSIS — B351 Tinea unguium: Secondary | ICD-10-CM | POA: Diagnosis not present

## 2022-06-22 DIAGNOSIS — M79674 Pain in right toe(s): Secondary | ICD-10-CM

## 2022-06-22 NOTE — Progress Notes (Signed)
Subjective:  Patient ID: Tami Vega, female    DOB: 1940-09-05,  MRN: [id number]  Chief Complaint  Patient presents with   Nail Problem    Thick painful toenails, 3 month follow up    82 y.o. female presents with the above complaint. History confirmed with patient. Patient presenting with pain related to dystrophic thickened elongated nails. Patient is unable to trim own nails related to nail dystrophy and/or mobility issues. Patient does not have a history of T2DM.   Objective:  Physical Exam: warm, good capillary refill nail exam onychomycosis of the toenails, onycholysis, and dystrophic nails DP pulses palpable, PT pulses palpable, and protective sensation intact Left Foot:  Pain with palpation of nails due to elongation and dystrophic growth.  Right Foot: Pain with palpation of nails due to elongation and dystrophic growth.   Assessment:   1. Pain due to onychomycosis of toenails of both feet       Plan:  Patient was evaluated and treated and all questions answered.  #Onychomycosis with pain  -Nails palliatively debrided as below. -Educated on self-care - Defer treatment with antifungals and instead proceed with routine care.   Procedure: Nail Debridement Rationale: Pain Type of Debridement: manual, sharp debridement. Instrumentation: Nail nipper, rotary burr. Number of Nails: 10  Return in about 3 months (around 09/22/2022) for RFC.         Everitt Amber, DPM [facility]

## 2022-08-18 ENCOUNTER — Ambulatory Visit: Payer: Medicare HMO | Admitting: Neurology

## 2022-08-18 ENCOUNTER — Ambulatory Visit (INDEPENDENT_AMBULATORY_CARE_PROVIDER_SITE_OTHER): Payer: Medicare Other | Admitting: Neurology

## 2022-08-18 ENCOUNTER — Encounter: Payer: Self-pay | Admitting: Neurology

## 2022-08-18 VITALS — BP 96/57 | HR 75 | Ht 67.0 in | Wt 170.6 lb

## 2022-08-18 DIAGNOSIS — Z8616 Personal history of COVID-19: Secondary | ICD-10-CM | POA: Diagnosis not present

## 2022-08-18 DIAGNOSIS — R058 Other specified cough: Secondary | ICD-10-CM | POA: Diagnosis not present

## 2022-08-18 DIAGNOSIS — G20B2 Parkinson's disease with dyskinesia, with fluctuations: Secondary | ICD-10-CM

## 2022-08-18 DIAGNOSIS — I951 Orthostatic hypotension: Secondary | ICD-10-CM

## 2022-08-18 NOTE — Progress Notes (Signed)
Subjective:    Patient ID: Tami Vega is a 82 y.o. female.  HPI    Interim history:   Tami Vega is a very pleasant 82 year old right-handed woman with an underlying medical history of hyperlipidemia, history of pituitary microadenoma, precancerous breast lesions status post double mastectomy in the 1980s, who presents for follow-up consultation of her left-sided predominant Parkinson's disease, complicated by orthostatic hypotension, anxiety, and sleep disturbance.  The patient is accompanied by her son, Tami Vega, again today.  I last saw her on 04/27/2022, at which time she reported feeling anxious when she does not get her Sinemet on time, she was currently at [facility] and rehab in [place] in skilled nursing and they have a certain timeframe during which they can administer medications.  She had not fallen recently. She had been started on BuSpar per PCP.  She was advised to continue with levodopa 1 pill 6 times a day, Azilect once daily. I advised her not to restart the entacapone, due to increased risk for orthostatic hypotension, hallucinations, and dyskinesias.   Today, 08/18/2022: She reports doing fairly well, she continues to have a lingering cough but it seems to be mostly in her throat and upper respiratory system, no wheezing, has had a little bit of shortness of breath here and there, fairly thick mucus at times.  Her son reports that she had COVID about a month ago, so mother patient's at her rehab facility had COVID and she was also tested, did not actually have any symptoms to warrant testing other than other people around her being infected.  She did end up having a cough but no fever, she was treated and is currently also on cough medicine.  As far as her Parkinson symptoms, she feels fairly stable, has not had any recent falls and tries to use her walker consistently, does not need further assistance when using her nitro walker.  She does have some lightheadedness when she  stands up quickly.  She tries to hydrate well but could do a little better according to her son.  She does have intermittent constipation for which she is on as needed MiraLAX.  She continues to stay at [facility].  She takes her levodopa 6 times a day, usually on a 2-1/2 hourly schedule which has worked better for her.  We changed this in the interim in February 2024 from a 3 hourly scheduled previously.  She continues to take Azilect once a day.  The patient's allergies, current medications, family history, past medical history, past social history, past surgical history and problem list were reviewed and updated as appropriate.    Previously (copied from previous notes for reference):        I saw her on 10/23/2021, at which time she was advised to increase her Sinemet to 1 pill 6 times a day and discontinue Comtan due to blood pressure dysregulation.  She was advised to continue with Azilect.   She saw Tami Dapper, NP on 02/17/2022, at which time she was advised to continue with her medication regimen.       I saw her on 04/15/2021, at which time she was advised to continue with Sinemet 5 times a day, entacapone 3 times a day and Azilect once daily.  She was on Lexapro 10 mg daily.  She was advised to follow-up with her primary care to optimize treatment for anxiety and stress.  She saw Tami Dapper, NP in the interim on 08/20/2021, at which time she  was advised to add Parcopa half a tablet up to twice daily as needed for additional help with her regular levodopa wearing off.        I saw her on 06/10/2020, at which time she reported a recent fall.  She did hit her head.  She was not hydrating as well as she should and had been told by her primary care that she was dehydrated.  She reported ongoing stress and worry about her husband.  She was in home health physical therapy.  She was advised to continue with Sinemet 1 pill 5 times a day, Azilect once daily and entacapone 1 pill 3  times a day.  She was advised to hydrate better.  Since she had sustained a fall with head injury, I advised her to get a head CT done.  She had a normal head CT through [facility] in [place] on 06/13/2020 and I reviewed the results: Impression: Mildly motion degraded exam.  No evidence of acute intracranial abnormality.  Left forehead soft tissue swelling.  Mild cerebral atrophy.  1.3 x 0.9 cm nonspecific polypoid soft tissue fullness expanding the right sphenoethmoidal recess, stable as compared to the head CT of 11/02/2019.  Consider ENT consultation as clinically warranted.   She saw Tami Dapper, NP in the interim on 10/14/2020, at which time she reported no recent falls.  She was advised to continue with her medication regimen.     I saw her on 02/12/2020, at which time we talked about the importance of fall prevention.  She had fallen in August 2021 and broke her proximal right humerus.  She was treated conservatively.  She was in rehab and subsequently had home health therapy.  She has ongoing stress.  She was advised to continue her Parkinson's medications.     I saw her on 10/10/2019, at which time she reported that her Sinemet was not lasting as long.  She was taking 1 pill 4 times a day.  She also had more stress what with her husband's declining health and memory loss.      I saw her on 04/12/2019, at which time she reported tolerating Comtan.  We mutually agreed to increase it to 1 pill 3 times daily.  She was advised to continue with Sinemet and Azilect and we talked about potentially utilizing inbrija potentially down the road.         I saw her on 12/07/2018, at which time she reported taking Comtan only once daily at 11 AM.  She felt that she was too sedated when she took it twice daily but she was willing to try it again.  I suggest that she added for the 3 PM dose.  We kept her Sinemet the same as well as the Azilect.          I saw her on 09/05/2018, at which time she reported  feeling more slower, she had had a few times of feeling lightheaded upon standing up too quickly.  She had a fall in the kitchen.  She was taking Sinemet 4 times a day.  She was more fatigued by the end of the day.  Constipation was under control.  She had had some stable hallucinations, nothing bothersome.  I suggested a cautious addition of Comtan for 2 of her 4 doses of Sinemet.  She called back in July 2020 indicating that she felt more unsteady since starting the Comtan and she was advised to reduce the Comtan to only once daily with  1 of her Sinemet doses.   I saw her on 03/02/2018, at which time she reported that she was not able to tolerate the Sinemet long-acting.  She had problems with insomnia, she was no longer on it.  We had started it in or around June 2019.  She was generally taking Sinemet 4 times a day.  She was having more difficulty in the mornings in terms of mobility.  She was advised to take a middle of the night dose of Sinemet for total of 5 pills/day, the middle of the night dose anywhere between 11 PM and 4 AM.  We talked about the importance of monitoring her driving.  She had limited her driving.     I saw her on 08/30/2017, at which time she reported having had one fall in the past 6 months. She fell in the kitchen. She unfortunately cracked a couple of ribs, had an x-ray through PCP. She noticed that she was more stiffer in the mornings and had difficulty moving at times, shuffling more. We talked about the importance of fall prevention and the importance of being proactive about constipation. She was advised to hydrate well with water and continue with Sinemet 1 pill 4 times a day at 7, 11, 3 and 7. I asked her to add Sinemet CR 50-200 milligrams strength at bedtime. She was advised to continue with Azilect once daily as well.   She called a few days after that in the interim reporting that she felt more sleepy after starting the Sinemet CR. I suggested we could reduce it to  25-100 milligrams strength long-acting but she declined.   I saw her on 02/22/17, at which time she reported doing okay, sometimes she felt lightheaded. She had had a few falls, with a tendency to fall backwards when she stood up too quickly. For her occasional constipation she was using MiraLAX as needed about twice a week. I advised her to continue her current Parkinson's disease medication regimen including Sinemet 1 pill 4 times a day and Azilect 1 pill once daily, talked about fall risk and gait safety.   I saw her on 08/18/2016, at which time she reported increase in tremor on the left side. She had some difficulty with sleep at times. She had not tried any over-the-counter medication. She had a fall about 3 months prior in the yard. Thankfully she did not sustain any major injuries. She did bruise her right thigh. I suggested she try to increase her Sinemet to 1 pill 4 times a day and keep her Azilect at 1 mg once daily. She was advised to try melatonin for sleep.   I saw her on 02/19/2016, at which time she reported overall doing quite well, she was active, sewing and making crafts. She had no recent falls but had noticed lightheadedness particularly upon standing up quickly. She had a near fall experience recently at a restaurant. She was not always hydrating well. Mood and memory were stable. Motor-wise, she felt stable as well. I suggested we continue with her medication regimen, including Azilect and Sinemet.Marland Kitchen She was furthermore advised to stay better hydrated and change positions slowly, also start using compression stockings.   I saw her on 08/12/2015, at which time she reported feeling fairly stable. She was going to the gym about 4 times a week, had lost weight, mostly because of change in taste/lack of taste. Also, she cut out sodas and has been watching what she eats, trying to drink more water. Overall,  motor-wise she felt stable, had mild short-term memory issues but no mood or sinister  memory issues, no recent falls. Sometimes her left hand tremor was worse. She has 3 grown children and 3 grandchildren. She was on low-dose Lexapro. I suggested she continue with Azilect once daily and Sinemet 1 pill 3 times a day.   I saw her on 02/11/2015, at which time she reported doing better with the recent addition of Sinemet. She felt she had more energy and mobility was better as well. She needed refills on paper prescriptions because of changing her pharmacies. Her daughter reported that she had stopped taking her antidepressant for a couple of months but her family had noticed more problems with her depression and anxiety and she restarted her medication. She was trying to stay active. She is trying to drink enough water. She did have additional stressors lately because her son was involved in a car accident and was still in the hospital at the time. Overall, however, she had done fairly well. We mutually agreed to continue her on Azilect 1 mg once daily and Sinemet 1 pill 3 times a day.   I saw her on 10/16/2014, at which time she reported that her tremor was a little worse. She also felt that her walking was worse. She had a harder time picking up her feet, particularly on the left side. Thankfully she had not fallen. She was sleeping a little better since switching her antidepressant to nighttime. I asked her to continue with Azilect once daily but also to add a small dose of Sinemet starting with half a pill twice daily with gradual titration to 1 pill 3 times a day.    I saw her on 04/17/2014, at which time she reported doing a little better with regards to her tremor. She noticed a flareup of her tremor with nervousness. She was still able to use her sewing machine. She had no side effects from her medications. She did find out that she had 2 female cousins on her father's side with Parkinson's disease. I asked her to continue Azilect 1 mg once daily.   I first met her on 10/20/2013 at the  request of her primary care physician, at which time she gave a 6-12 month history of left upper extremity tremor. She also reported problems with fine motor skills on the left. Her history and physical exam are in keeping with mild parkinsonism, probably left eye predominant Parkinson's disease. I suggested further workup with a brain MRI and starting her on Azilect. She did not have a brain scan done.    She has noted L sided issues with fine motor skills, resting tremor on the LUE, she feels easily fatigued and it is difficult for her to pick up her L leg sometimes. Thankfully, she has not fallen, but her balance is off at times. She goes to [employer] 4 times a week, but has less stamina. She is a life long non-smoker, does not drink alcohol and has no history of head injury, exposure to pesticides, but did work in a [employer], therapeutic for 33 years and [place]. She has no FHx of PD or tremors.      Her Past Medical History Is Significant For: Past Medical History:  Diagnosis Date   Abnormal involuntary movements(781.0)    Disorder of bone and cartilage, unspecified    Elevated blood pressure reading without diagnosis of hypertension    Major depressive disorder, single episode, unspecified  Other musculoskeletal symptoms referable to limbs(729.89)    Pain in joint, lower leg    Pure hypercholesterolemia     Her Past Surgical History Is Significant For: Past Surgical History:  Procedure Laterality Date   BREAST SURGERY  1980's   benign cyst   COLONOSCOPY  1995, 1998   VAGINAL HYSTERECTOMY  1975    Her Family History Is Significant For: Family History  Problem Relation Age of Onset   CAD Mother    Breast cancer Mother    CAD Father    Parkinson's disease Father    Tremor Sister    Parkinson's disease Sister    Hypertension Sister    Hypertension Sister    CAD Sister    CAD Brother    Breast cancer Maternal Aunt    Colon cancer Maternal Aunt     Her Social History  Is Significant For: Social History   Socioeconomic History   Marital status: Married    Spouse name: Not on file   Number of children: Not on file   Years of education: Not on file   Highest education level: Not on file  Occupational History   Not on file  Tobacco Use   Smoking status: Never   Smokeless tobacco: Never  Substance and Sexual Activity   Alcohol use: No    Alcohol/week: 0.0 standard drinks of alcohol   Drug use: No   Sexual activity: Not on file  Other Topics Concern   Not on file  Social History Narrative   Lives in assisted living at [facility]   Right handed    Caffeine: coffee 1 cup/day   Social Determinants of Health   Financial Resource Strain: Not on file  Food Insecurity: Not on file  Transportation Needs: Not on file  Physical Activity: Not on file  Stress: Not on file  Social Connections: Not on file    Her Allergies Are:  No Known Allergies:   Her Current Medications Are:  Outpatient Encounter Medications as of 08/18/2022  Medication Sig   acetaminophen (TYLENOL) 325 MG tablet Take 650 mg by mouth every 6 (six) hours as needed.   benzocaine-menthol (CHLORASEPTIC) 6-10 MG lozenge Take 1 lozenge by mouth as needed for sore throat (every 2 hours as needed).   calcium carbonate (TUMS - DOSED IN MG ELEMENTAL CALCIUM) 500 MG chewable tablet Chew 2 tablets by mouth every 4 (four) hours as needed for indigestion or heartburn.   carbidopa-levodopa (SINEMET IR) 25-100 MG tablet Take 1 tablet by mouth 6 (six) times daily. Take at 4 AM, 7 AM, 10 AM, 1 PM, 4 PM and  7 PM (Patient taking differently: Take 1 tablet by mouth 6 (six) times daily. Take at 5 AM, 8 AM, 11 AM, 2 PM, 5 PM and  8 PM)   D-Mannose 500 MG CAPS Take 1 Capful by mouth in the morning and at bedtime.   diclofenac Sodium (VOLTAREN) 1 % GEL Apply topically every 8 (eight) hours as needed.   escitalopram (LEXAPRO) 20 MG tablet Take 20 mg by mouth daily.   guaifenesin (ROBITUSSIN) 100  MG/5ML syrup Take 200 mg by mouth 4 (four) times daily as needed for cough.   levothyroxine (SYNTHROID) 75 MCG tablet Take 75 mcg by mouth every morning.   melatonin 5 MG TABS Take 5 mg by mouth at bedtime.   Multiple Vitamin (MULTIVITAMIN) tablet Take 1 tablet by mouth daily.   OMEPRAZOLE PO Take 20 mg by mouth daily.  polyethylene glycol (MIRALAX / GLYCOLAX) 17 g packet Take 17 g by mouth daily as needed for mild constipation.   rasagiline (AZILECT) 1 MG TABS tablet Take 1 tablet (1 mg total) by mouth daily.   simethicone (MYLICON) 80 MG chewable tablet Chew 80 mg by mouth every 6 (six) hours as needed for flatulence.   vitamin B-12 (CYANOCOBALAMIN) 500 MCG tablet Take 1,000 mcg by mouth daily.   [DISCONTINUED] b complex vitamins tablet Take 1 tablet by mouth daily. (Patient not taking: Reported on 08/18/2022)   [DISCONTINUED] Calcium Carbonate-Vit D-Min (CALCIUM 1200 PO) Take 1 tablet by mouth daily. Take one tablet a day (Patient not taking: Reported on 08/18/2022)   [DISCONTINUED] cholecalciferol (VITAMIN D) 25 MCG tablet Take 1 tablet (1,000 Units total) by mouth daily. (Patient not taking: Reported on 08/18/2022)   [DISCONTINUED] fish oil-omega-3 fatty acids 1000 MG capsule Take 1 g by mouth daily. (Patient not taking: Reported on 08/18/2022)   [DISCONTINUED] Probiotic Product (PROBIOTIC PO) Take 1 capsule by mouth daily. (Patient not taking: Reported on 08/18/2022)   No facility-administered encounter medications on file as of 08/18/2022.  :  Review of Systems:  Out of a complete 14 point review of systems, all are reviewed and negative with the exception of these symptoms as listed below:  Review of Systems  Neurological:        Parkinson's disease.  Has covid recently (a little over a month ago).  Lingering cough.  No falls.  Needs to hydrate more. Trying to exercise/walk.     Objective:  Neurological Exam  Physical Exam Physical Examination:   Vitals:   08/18/22 0934  BP: (!)  96/57  Pulse: 75    General Examination: The patient is a very pleasant 82 y.o. female in no acute distress. She appears somewhat frail, well-groomed.   HEENT: Normocephalic, atraumatic, pupils are equal, round and reactive to light. Corrective eyeglasses in place, bilateral cataracts noted. Extraocular tracking shows saccadic breakdown without nystagmus noted.  There is decrease in eye blink rate. Hearing is impaired. Face is symmetric with moderate facial masking and normal facial sensation. There is no lip, neck or jaw tremor. Neck is moderately rigid with decreased ROM and forward stooped neck, stable. Oropharynx exam reveals mild to moderate mouth dryness. No significant airway crowding is noted. Mallampati is class II. Tongue protrudes centrally and palate elevates symmetrically. There is no obvious sialorrhea.  Speech is moderately hypophonic, slightly dysarthric at times and mildly hoarse.   Chest: is clear to auscultation without wheezing, rhonchi or crackles noted.   Heart: sounds are regular and normal without murmurs, rubs or gallops noted.    Abdomen: is soft, non-tender and non-distended.   Extremities: There is trace puffiness in the distal lower extremities bilaterally.      Skin: is warm and dry with no trophic changes noted. Age-related changes are noted on the skin.   Musculoskeletal: exam reveals no obvious joint deformities.   Neurologically:  Mental status: The patient is awake and alert, paying good attention, at times slower to respond, speech is hypophonic, memory mildly impaired, her son gives details of her history, mild bradyphrenia.  Slight dysarthria noted at times. Affect normal and stable.  Cranial nerves are as described above under HEENT exam.   Motor exam: Thin bulk, global strength of 4 out of 5, mild generalized dyskinesias noted.  Tone is rigid with cogwheeling in both upper extremities.  There is overall moderate bradykinesia. There is no drift or rebound.  There is a mild to moderate resting tremor in the left upper extremity. Romberg is not tested d/t safety concern. Fine motor skills exam: moderately impaired on the right and moderate to significantly impaired on the left, but stable.    Cerebellar testing shows no dysmetria or intention tremor on finger to nose testing. there is no truncal or gait ataxia.    Sensory exam is intact to light touch in the upper and lower extremities.    Gait, station and balance: She stands up from the seated position with mild difficulty, but requires no assistance, she walks with her Nitro walker.  Balance is mildly impaired.    Assessment and Plan:    In summary, Tami Vega is a very pleasant 82 year old female with an underlying medical history of hyperlipidemia, history of pituitary microadenoma, precancerous breast lesions (with s/p double mastectomy in the 1980s), who presents for follow-up consultation of her left-sided predominant Parkinson's disease with symptoms dating back to about 8 years ago. She has had progression over time, significantly more notable progression in the past 2+ years.  She has had falls.  She had a fall in August 2021 and broke her arm.  She has also developed dyskinesias and intermittent mild visual hallucinations, as well as more significant constipation and orthostatic hypotension with a syncopal event in 2023. She has had increase in stress what with her husband's medical issues and memory loss.  Her other son is currently taking care of of her husband, her daughter Tami Vega is also involved in their care.  She is currently in SNF and has had increase in anxiety.  She has an older brother and 2 younger sisters.  Her son, Tami Vega, is power of attorney, her son, Tami Vega, lives with them (at home).  She started C/L in July 2016. She is also on Azilect 1 mg once daily, started this in July 2015. Sinemet was increased in 5/18 to 1 pill 4 times a day. She has had a fall with injury to the  ribs. For depression and anxiety she has been maintained on Lexapro, currently at 20 mg daily.  We increased her Sinemet to 1 pill 5 times daily in June 2021.  She was taken off Comtan in July 2023.   She is advised to continue with Sinemet 1 pill 6 times a day, starting at 5 AM, on a 2-1/2 hourly schedule which has worked well for her.  She continues to take Azilect 1 pill once daily.  We will continue to monitor for constipation and hallucinations, currently is doing quite well, she has mild dyskinesias. She is advised to stay better hydrated with water. She is advised to use her walker at all times.  She is struggling with a lingering cough since her COVID diagnosis about a month ago.   We talked about the importance of fall prevention again today.    She is advised to continue with MiraLAX as needed for constipation.    Overall, she looks stable.  We will try to maintain her medication regimen and I would avoid restarting entacapone for fear of escalating dyskinesias, increasing hallucinations, and risk for orthostatic hypotension.    I recommend a follow-up to see one of our nurse practitioners in about 5-6 months, sooner if needed.    I answered all their questions today and the patient and her son were in agreement.  I spent 30 minutes in total face-to-face time and in reviewing records during pre-charting, more than 50% of which was spent  in counseling and coordination of care, reviewing test results, reviewing medications and treatment regimen and/or in discussing or reviewing the diagnosis of PD, the prognosis and treatment options. Pertinent laboratory and imaging test results that were available during this visit with the patient were reviewed by me and considered in my medical decision making (see chart for details).

## 2022-08-18 NOTE — Patient Instructions (Signed)
We will continue with your current medication regimen, which has worked out well for you.  Follow up in about 5-6 months to see Shawnie Dapper, NP.

## 2022-08-26 ENCOUNTER — Ambulatory Visit: Payer: Medicare Other | Admitting: Internal Medicine

## 2022-08-26 ENCOUNTER — Encounter: Payer: Self-pay | Admitting: Internal Medicine

## 2022-08-26 VITALS — BP 128/86 | HR 90 | Temp 97.5°F | Resp 18 | Ht 67.0 in | Wt 173.8 lb

## 2022-08-26 DIAGNOSIS — R140 Abdominal distension (gaseous): Secondary | ICD-10-CM

## 2022-08-26 NOTE — Assessment & Plan Note (Signed)
She is having just the symptoms of bloating.  She will start on scheduled simethicone 80mg  TID after meals and she can continue on tums as needed.  I also want her to have regular BM's.  I want her to take metamucil daily and use miralax prn every other day if she does not have a bowel movement.  She is currently on omeprazole 20mg  BID per the nurse at [facility].

## 2022-08-26 NOTE — Progress Notes (Signed)
Office Visit  Subjective   Patient ID: Tami Vega   DOB: [birth year]   Age: 82 y.o.   MRN: [id number]   Chief Complaint Chief Complaint  Patient presents with   Bloated    Stomach    Nasal Congestion    Had COVID about a month ago     History of Present Illness Tami Vega is a 82 yo female who comes in today with complaints of bloating.  She states this started about 1 month ago where she is having bloating that can occur after meals.  I contacted the nurses station at [facility] and they tell me that she asks for TUMS frequently for bloating and she has simthicone which they give her at times for bloating.  She denies any nausea, vomiting, diarrhea or regular constipation.  She has constipation at times where she uses prn miralax.  There is no abdominal pain or other problems.     Past Medical History Past Medical History:  Diagnosis Date   Abnormal involuntary movements(781.0)    Disorder of bone and cartilage, unspecified    Elevated blood pressure reading without diagnosis of hypertension    Major depressive disorder, single episode, unspecified    Other musculoskeletal symptoms referable to limbs(729.89)    Pain in joint, lower leg    Pure hypercholesterolemia      Allergies No Known Allergies   Medications  Current Outpatient Medications:    acetaminophen (TYLENOL) 325 MG tablet, Take 650 mg by mouth every 6 (six) hours as needed., Disp: , Rfl:    benzocaine-menthol (CHLORASEPTIC) 6-10 MG lozenge, Take 1 lozenge by mouth as needed for sore throat (every 2 hours as needed)., Disp: , Rfl:    calcium carbonate (TUMS - DOSED IN MG ELEMENTAL CALCIUM) 500 MG chewable tablet, Chew 2 tablets by mouth every 4 (four) hours as needed for indigestion or heartburn., Disp: , Rfl:    carbidopa-levodopa (SINEMET IR) 25-100 MG tablet, Take 1 tablet by mouth 6 (six) times daily. Take at 4 AM, 7 AM, 10 AM, 1 PM, 4 PM and  7 PM (Patient taking differently: Take 1 tablet by mouth 6  (six) times daily. Take at 5 AM, 8 AM, 11 AM, 2 PM, 5 PM and  8 PM), Disp: 540 tablet, Rfl: 3   D-Mannose 500 MG CAPS, Take 1 Capful by mouth in the morning and at bedtime., Disp: , Rfl:    diclofenac Sodium (VOLTAREN) 1 % GEL, Apply topically every 8 (eight) hours as needed., Disp: , Rfl:    escitalopram (LEXAPRO) 20 MG tablet, Take 20 mg by mouth daily., Disp: , Rfl: 4   guaifenesin (ROBITUSSIN) 100 MG/5ML syrup, Take 200 mg by mouth 4 (four) times daily as needed for cough., Disp: , Rfl:    levothyroxine (SYNTHROID) 75 MCG tablet, Take 75 mcg by mouth every morning., Disp: , Rfl:    melatonin 5 MG TABS, Take 5 mg by mouth at bedtime., Disp: , Rfl:    Multiple Vitamin (MULTIVITAMIN) tablet, Take 1 tablet by mouth daily., Disp: , Rfl:    OMEPRAZOLE PO, Take 20 mg by mouth daily., Disp: , Rfl:    polyethylene glycol (MIRALAX / GLYCOLAX) 17 g packet, Take 17 g by mouth daily as needed for mild constipation., Disp: , Rfl:    rasagiline (AZILECT) 1 MG TABS tablet, Take 1 tablet (1 mg total) by mouth daily., Disp: 90 tablet, Rfl: 3   simethicone (MYLICON) 80 MG chewable tablet, Chew 80 mg  by mouth every 6 (six) hours as needed for flatulence., Disp: , Rfl:    vitamin B-12 (CYANOCOBALAMIN) 500 MCG tablet, Take 1,000 mcg by mouth daily., Disp: , Rfl:    Review of Systems Review of Systems  Constitutional:  Negative for chills and fever.  Respiratory:  Negative for cough and shortness of breath.   Cardiovascular:  Negative for chest pain, palpitations and leg swelling.  Gastrointestinal:  Negative for abdominal pain, blood in stool, constipation, diarrhea, heartburn, nausea and vomiting.  Musculoskeletal:  Negative for myalgias.  Neurological:  Negative for dizziness, weakness and headaches.       Objective:    Vitals BP 128/86 (BP Location: Left Arm, Patient Position: Sitting, Cuff Size: Normal)   Pulse 90   Temp (!) 97.5 F (36.4 C) (Temporal)   Resp 18   Ht 5\' 7"  (1.702 m)   Wt 173 lb  12.8 oz (78.8 kg)   SpO2 93%   BMI 27.22 kg/m    Physical Examination Physical Exam Constitutional:      Appearance: Normal appearance. She is not ill-appearing.  Cardiovascular:     Rate and Rhythm: Normal rate and regular rhythm.     Pulses: Normal pulses.     Heart sounds: No murmur heard.    No friction rub. No gallop.  Pulmonary:     Effort: Pulmonary effort is normal. No respiratory distress.     Breath sounds: No wheezing, rhonchi or rales.  Abdominal:     General: Bowel sounds are normal. There is no distension.     Palpations: Abdomen is soft.     Tenderness: There is no abdominal tenderness.  Musculoskeletal:     Right lower leg: No edema.     Left lower leg: No edema.  Skin:    General: Skin is warm and dry.     Findings: No rash.  Neurological:     Mental Status: She is alert.        Assessment & Plan:   Abdominal bloating She is having just the symptoms of bloating.  She will start on scheduled simethicone 80mg  TID after meals and she can continue on tums as needed.  I also want her to have regular BM's.  I want her to take metamucil daily and use miralax prn every other day if she does not have a bowel movement.  She is currently on omeprazole 20mg  BID per the nurse at [facility].      No follow-ups on file.   Crist Fat, MD

## 2022-09-21 ENCOUNTER — Ambulatory Visit (INDEPENDENT_AMBULATORY_CARE_PROVIDER_SITE_OTHER): Payer: Medicare Other | Admitting: Podiatry

## 2022-09-21 DIAGNOSIS — Z91199 Patient's noncompliance with other medical treatment and regimen due to unspecified reason: Secondary | ICD-10-CM

## 2022-09-21 NOTE — Progress Notes (Signed)
Pt was a no show for apt 

## 2022-10-12 ENCOUNTER — Ambulatory Visit: Payer: Medicare Other | Admitting: Podiatry

## 2022-10-26 ENCOUNTER — Ambulatory Visit (INDEPENDENT_AMBULATORY_CARE_PROVIDER_SITE_OTHER): Payer: Medicare Other | Admitting: Podiatry

## 2022-10-26 DIAGNOSIS — B351 Tinea unguium: Secondary | ICD-10-CM | POA: Diagnosis not present

## 2022-10-26 DIAGNOSIS — M79674 Pain in right toe(s): Secondary | ICD-10-CM

## 2022-10-26 DIAGNOSIS — M79675 Pain in left toe(s): Secondary | ICD-10-CM | POA: Diagnosis not present

## 2022-10-26 NOTE — Progress Notes (Signed)
Subjective:  Patient ID: Tami Vega, female    DOB: 1940/04/21,  MRN: [id number]  Chief Complaint  Patient presents with   Nail Problem    Routine Foot Care-nail trim    Foot Pain    Right heel pain at times. Patient describes pain as soreness and not constant. Worst when she is laying down.     82 y.o. female presents with the above complaint. History confirmed with patient. Patient presenting with pain related to dystrophic thickened elongated nails. Patient is unable to trim own nails related to nail dystrophy and/or mobility issues. Patient does not have a history of T2DM.   Objective:  Physical Exam: warm, good capillary refill nail exam onychomycosis of the toenails, onycholysis, and dystrophic nails DP pulses palpable, PT pulses palpable, and protective sensation intact Left Foot:  Pain with palpation of nails due to elongation and dystrophic growth.  Right Foot: Pain with palpation of nails due to elongation and dystrophic growth.   Assessment:   1. Pain due to onychomycosis of toenails of both feet        Plan:  Patient was evaluated and treated and all questions answered.  #Onychomycosis with pain  -Nails palliatively debrided as below. -Educated on self-care - Defer treatment with antifungals and instead proceed with routine care.   Procedure: Nail Debridement Rationale: Pain Type of Debridement: manual, sharp debridement. Instrumentation: Nail nipper, rotary burr. Number of Nails: 10  Return in about 3 months (around 01/26/2023) for RFC.         Corinna Gab, DPM [facility] / [facility]

## 2022-11-05 ENCOUNTER — Ambulatory Visit: Payer: Medicare Other | Admitting: Physician Assistant

## 2022-12-14 ENCOUNTER — Telehealth: Payer: Self-pay | Admitting: Neurology

## 2022-12-14 NOTE — Telephone Encounter (Signed)
Tami Vega. PA at [facility] called and LVM for RN or MD to call him back discuss possible medication adjustment for the pt. Please advise.

## 2022-12-15 NOTE — Telephone Encounter (Signed)
I called Onalee Hua back and gave verbal order to try 1/2 tablet of Sinemet 6 times a day.  He will take care of the prescription on his end.   Patient's next follow-up is November 26th w/ Amy NP. I asked him to call us before then if needed. He verbalized appreciation for the call.

## 2022-12-15 NOTE — Telephone Encounter (Signed)
I called back Onalee Hua PA @ [facility].  He states they are dealing with low blood pressures related to Sinemet IR despite the use of Midodrine 10 mg TID.  He states every time the patient takes Sinemet her blood pressure drops to around 90/60.  She takes 1 tablet 6 times a day at 5 AM, 8 AM, 11 AM, 2 PM, 5 PM, 8 PM.  He states that if the nurses are even 15 minutes late during the med pass she starts to have difficulty with movement and speech and sometimes they have to give it early due to this. Of note, when Sinemet is due her BP is back to around 140/90. Once she takes the medication her movement improves but her blood pressure drops again.  She has had a couple of recent hospitalizations but she remains on rasagiline 1 mg daily at 8 AM and Sinemet.  The medical director placed her on Solu-Medrol recently due to respiratory issue but the steroid has had little effect on her blood pressure.  Onalee Hua would like to know what Dr Frances Furbish recommends.

## 2022-12-15 NOTE — Telephone Encounter (Signed)
They may be able to give half a pill of the Sinemet 6 times a day instead of a whole pill.  Not a whole lot we can do with brittle blood pressure, but it may be worth trying to reduce the Sinemet dosing.  Please call back and give verbal to Onalee Hua with [facility] and rehab.

## 2022-12-17 DIAGNOSIS — Z7409 Other reduced mobility: Secondary | ICD-10-CM | POA: Insufficient documentation

## 2023-01-05 ENCOUNTER — Ambulatory Visit: Payer: Medicare Other | Admitting: Cardiovascular Disease

## 2023-01-18 ENCOUNTER — Encounter: Payer: Self-pay | Admitting: Cardiovascular Disease

## 2023-01-18 ENCOUNTER — Ambulatory Visit: Payer: Medicare Other | Attending: Cardiovascular Disease | Admitting: Cardiovascular Disease

## 2023-01-18 VITALS — BP 134/58 | HR 84

## 2023-01-18 DIAGNOSIS — R55 Syncope and collapse: Secondary | ICD-10-CM | POA: Insufficient documentation

## 2023-01-18 NOTE — Progress Notes (Signed)
Chief Complaint  Patient presents with   Follow-up    Dizziness    History of Present Illness: 82 yo female with history of Parkinson's disease, hypothyroidism and hyperlipidemia who is here today for follow up. I saw her as a new consult in September 2023 for the evaluation of syncope. She had two syncopal events in July 2023. CT head negative. Echo 10/06/21 with LVEF=65-70%, no valve disease. She was found to have a UTI. She was orthostatic and was treated with IV fluids. She was seen in 2014 by Dr. Elease Hashimoto for chest pain and had a normal stress echo at that time.   She is here today for follow up. The patient denies any chest pain, dyspnea, palpitations, lower extremity edema, orthopnea, PND. She has continued to struggle with orthostatic hypotension. She has had several syncopal events when standing. She has been hydrated in the [facility] and has been given IV fluids with improvement in symptoms.   Primary Care Physician: Crist Fat, MD   Past Medical History:  Diagnosis Date   Abnormal involuntary movements(781.0)    Disorder of bone and cartilage, unspecified    Elevated blood pressure reading without diagnosis of hypertension    Major depressive disorder, single episode, unspecified    Other musculoskeletal symptoms referable to limbs(729.89)    Pain in joint, lower leg    Pure hypercholesterolemia     Past Surgical History:  Procedure Laterality Date   BREAST SURGERY  1980's   benign cyst   COLONOSCOPY  1995, 1998   VAGINAL HYSTERECTOMY  1975    Current Outpatient Medications  Medication Sig Dispense Refill   acetaminophen (TYLENOL) 325 MG tablet Take 650 mg by mouth every 6 (six) hours as needed.     benzocaine-menthol (CHLORASEPTIC) 6-10 MG lozenge Take 1 lozenge by mouth as needed for sore throat (every 2 hours as needed).     calcium carbonate (TUMS - DOSED IN MG ELEMENTAL CALCIUM) 500 MG chewable tablet Chew 2 tablets by mouth every 4 (four) hours as  needed for indigestion or heartburn.     carbidopa-levodopa (SINEMET IR) 25-100 MG tablet Take 1 tablet by mouth 6 (six) times daily. Take at 4 AM, 7 AM, 10 AM, 1 PM, 4 PM and  7 PM (Patient taking differently: Take 0.5 tablets by mouth 6 (six) times daily. Take at 5 AM, 8 AM, 11 AM, 2 PM, 5 PM and  8 PM) 540 tablet 3   D-Mannose 500 MG CAPS Take 1 Capful by mouth in the morning and at bedtime.     diclofenac Sodium (VOLTAREN) 1 % GEL Apply topically every 8 (eight) hours as needed.     escitalopram (LEXAPRO) 20 MG tablet Take 20 mg by mouth daily.  4   guaifenesin (ROBITUSSIN) 100 MG/5ML syrup Take 200 mg by mouth 4 (four) times daily as needed for cough.     levothyroxine (SYNTHROID) 75 MCG tablet Take 75 mcg by mouth every morning.     melatonin 5 MG TABS Take 5 mg by mouth at bedtime.     Multiple Vitamin (MULTIVITAMIN) tablet Take 1 tablet by mouth daily.     OMEPRAZOLE PO Take 20 mg by mouth daily.     polyethylene glycol (MIRALAX / GLYCOLAX) 17 g packet Take 17 g by mouth daily as needed for mild constipation.     rasagiline (AZILECT) 1 MG TABS tablet Take 1 tablet (1 mg total) by mouth daily. 90 tablet 3   simethicone (  MYLICON) 80 MG chewable tablet Chew 80 mg by mouth every 6 (six) hours as needed for flatulence.     vitamin B-12 (CYANOCOBALAMIN) 500 MCG tablet Take 1,000 mcg by mouth daily.     No current facility-administered medications for this visit.    No Known Allergies  Social History   Socioeconomic History   Marital status: Married    Spouse name: Not on file   Number of children: Not on file   Years of education: Not on file   Highest education level: Not on file  Occupational History   Not on file  Tobacco Use   Smoking status: Never   Smokeless tobacco: Never  Substance and Sexual Activity   Alcohol use: No    Alcohol/week: 0.0 standard drinks of alcohol   Drug use: No   Sexual activity: Not on file  Other Topics Concern   Not on file  Social History  Narrative   Lives in assisted living at [facility]   Right handed    Caffeine: coffee 1 cup/day   Social Determinants of Health   Financial Resource Strain: Not on file  Food Insecurity: Low Risk  (12/15/2022)   Received from [facility]   Hunger Vital Sign    Worried About Running Out of Food in the Last Year: Never true    Ran Out of Food in the Last Year: Never true  Transportation Needs: No Transportation Needs (12/15/2022)   Received from [facility]    In the past 12 months, has lack of reliable transportation kept you from medical appointments, meetings, work or from getting things needed for daily living? : No  Physical Activity: Not on file  Stress: Not on file  Social Connections: Not on file  Intimate Partner Violence: Not on file    Family History  Problem Relation Age of Onset   CAD Mother    Breast cancer Mother    CAD Father    Parkinson's disease Father    Tremor Sister    Parkinson's disease Sister    Hypertension Sister    Hypertension Sister    CAD Sister    CAD Brother    Breast cancer Maternal Aunt    Colon cancer Maternal Aunt     Review of Systems:  As stated in the HPI and otherwise negative.   BP (!) 134/58   Pulse 84   SpO2 95%   Physical Examination: General: Well developed, well nourished, NAD  HEENT: OP clear, mucus membranes moist  SKIN: warm, dry. No rashes. Neuro: No focal deficits  Musculoskeletal: Muscle strength 5/5 all ext  Psychiatric: Mood and affect normal  Neck: No JVD, no carotid bruits, no thyromegaly, no lymphadenopathy.  Lungs:Clear bilaterally, no wheezes, rhonci, crackles Cardiovascular: Regular rate and rhythm. No murmurs, gallops or rubs. Abdomen:Soft. Bowel sounds present. Non-tender.  Extremities: No lower extremity edema. Pulses are 2 + in the bilateral DP/PT.  EKG:  EKG is ordered today. The ekg ordered today demonstrates  EKG Interpretation Date/Time:  Monday January 18 2023  15:49:18 EDT Ventricular Rate:  86 PR Interval:  170 QRS Duration:  90 QT Interval:  406 QTC Calculation: 485 R Axis:   -5  Text Interpretation: Normal sinus rhythm Normal ECG When compared with ECG of 05-Oct-2021 23:14, PREVIOUS ECG IS PRESENT Confirmed by Verne Carrow [phone]) on 01/18/2023 3:54:58 PM    Echo 10/06/21:  1. Left ventricular ejection fraction, by estimation, is 65 to 70%. The  left ventricle has normal function. The left ventricle has no regional  wall motion abnormalities. There is mild asymmetric left ventricular  hypertrophy of the basal-septal segment.  Left ventricular diastolic parameters are consistent with Grade I  diastolic dysfunction (impaired relaxation).   2. Right ventricular systolic function is normal. The right ventricular  size is normal. Tricuspid regurgitation signal is inadequate for assessing  PA pressure.   3. The mitral valve is normal in structure. No evidence of mitral valve  regurgitation. No evidence of mitral stenosis.   4. The aortic valve is tricuspid. Aortic valve regurgitation is not  visualized. Aortic valve sclerosis is present, with no evidence of aortic  valve stenosis.   Recent Labs: No results found for requested labs within last 365 days.   Lipid Panel No results found for: "CHOL", "TRIG", "HDL", "CHOLHDL", "VLDL", "LDLCALC", "LDLDIRECT"   Wt Readings from Last 3 Encounters:  08/26/22 78.8 kg  08/18/22 77.4 kg  04/27/22 75.8 kg    Assessment and Plan:   1. Orthostatic hypotension/Syncope: This was in the setting of a UTI and orthostatic hypotension likely from her UTI in 2023.Echo with normal LV function in 2023. Her dizziness and syncope seems to be related to orthostatic hypotension. Will continue to add salt to her food and push oral intake of fluids. No further cardiac testing is indicated today.    Labs/ tests ordered today include:   Orders Placed This Encounter  Procedures   EKG 12-Lead    Disposition:   F/U with me in one year.    Signed, Verne Carrow, MD 01/18/2023 4:22 PM    [facility] [address] [place], [place], [place]  [address] Phone: [phone]; Fax: [phone]

## 2023-01-18 NOTE — Patient Instructions (Addendum)
Medication Instructions:  No changes *If you need a refill on your cardiac medications before your next appointment, please call your pharmacy*   Lab Work: none   Testing/Procedures: none   Follow-Up: At [facility], you and your health needs are our priority.  As part of our continuing mission to provide you with exceptional heart care, we have created designated Provider Care Teams.  These Care Teams include your primary Cardiologist (physician) and Advanced Practice Providers (APPs -  Physician Assistants and Nurse Practitioners) who all work together to provide you with the care you need, when you need it.   Your next appointment:   12 month(s)  Provider:   Christopher McAlhany, MD   

## 2023-01-21 ENCOUNTER — Telehealth: Payer: Self-pay | Admitting: Neurology

## 2023-01-21 NOTE — Telephone Encounter (Signed)
I called Tami Vega back and her office is paging her.

## 2023-01-21 NOTE — Telephone Encounter (Signed)
Routed message to Dr Frances Furbish

## 2023-01-21 NOTE — Telephone Encounter (Signed)
Please call the psych provider back. I appreciate any help with mood d/o management and they can make changes to her depression medication, I am not sure who is prescribing the Lexapro at this time, it could be from PCP.

## 2023-01-21 NOTE — Telephone Encounter (Signed)
Tami Vega called back. I gave her Dr Teofilo Pod message and she verbalized understanding. I told her Dr Frances Furbish appreciates management of pt's mood d/o but does not prescribe Lexapro. PCP might prescribe it. Tami Vega said the pt's son wanted her to call neurology before she changed pt's medication. She said she works alongside pt's PCP in the same building.   FYI Dr Frances Furbish

## 2023-01-21 NOTE — Telephone Encounter (Signed)
LVM at 2:50 pm; calling on pt share with Dr. Frances Furbish. I see her at [facility], I am a psych med provider. Wanted to collaborate with Dr. Frances Furbish about depression medication, escitalopram (LEXAPRO) 20 MG tablet switching to another medication. Spoke with pt's POA her son and want to call neurology. You can give this number a call back at the service and they will send me a page: [phone]

## 2023-01-21 NOTE — Telephone Encounter (Signed)
Sounds good, thank you 

## 2023-01-25 ENCOUNTER — Ambulatory Visit (INDEPENDENT_AMBULATORY_CARE_PROVIDER_SITE_OTHER): Payer: Medicare Other | Admitting: Podiatry

## 2023-01-25 DIAGNOSIS — M79675 Pain in left toe(s): Secondary | ICD-10-CM

## 2023-01-25 DIAGNOSIS — B351 Tinea unguium: Secondary | ICD-10-CM | POA: Diagnosis not present

## 2023-01-25 DIAGNOSIS — M79674 Pain in right toe(s): Secondary | ICD-10-CM | POA: Diagnosis not present

## 2023-01-25 NOTE — Progress Notes (Signed)
Subjective:  Patient ID: Tami Vega, female    DOB: Sep 26, 1940,  MRN: [id number]  Chief Complaint  Patient presents with   Nail Problem    rfc    82 y.o. female presents with the above complaint. History confirmed with patient. Patient presenting with pain related to dystrophic thickened elongated nails. Patient is unable to trim own nails related to nail dystrophy and/or mobility issues. Patient does not have a history of T2DM.   Objective:  Physical Exam: warm, good capillary refill nail exam onychomycosis of the toenails, onycholysis, and dystrophic nails DP pulses palpable, PT pulses palpable, and protective sensation intact Left Foot:  Pain with palpation of nails due to elongation and dystrophic growth.  Right Foot: Pain with palpation of nails due to elongation and dystrophic growth.   Assessment:   1. Pain due to onychomycosis of toenails of both feet         Plan:  Patient was evaluated and treated and all questions answered.  #Onychomycosis with pain  -Nails palliatively debrided as below. -Educated on self-care - Defer treatment with antifungals and instead proceed with routine care.   Procedure: Nail Debridement Rationale: Pain Type of Debridement: manual, sharp debridement. Instrumentation: Nail nipper, rotary burr. Number of Nails: 10  Return in about 3 months (around 04/27/2023) for RFC.         Corinna Gab, DPM [facility] / [facility]

## 2023-02-17 NOTE — Progress Notes (Deleted)
No chief complaint on file.  HISTORY OF PRESENT ILLNESS:  02/17/23 ALL:  Tami Vega returns for follow up for PD. She was last seen by Dr Frances Furbish 07/2022. Sinemet 1 pill 6 times daily and Azilect daily continued.   08/18/2022 SA: She reports doing fairly well, she continues to have a lingering cough but it seems to be mostly in her throat and upper respiratory system, no wheezing, has had a little bit of shortness of breath here and there, fairly thick mucus at times.  Her son reports that she had COVID about a month ago, so mother patient's at her rehab facility had COVID and she was also tested, did not actually have any symptoms to warrant testing other than other people around her being infected.  She did end up having a cough but no fever, she was treated and is currently also on cough medicine.  As far as her Parkinson symptoms, she feels fairly stable, has not had any recent falls and tries to use her walker consistently, does not need further assistance when using her nitro walker.  She does have some lightheadedness when she stands up quickly.  She tries to hydrate well but could do a little better according to her son.  She does have intermittent constipation for which she is on as needed MiraLAX.  She continues to stay at [facility].  She takes her levodopa 6 times a day, usually on a 2-1/2 hourly schedule which has worked better for her.  We changed this in the interim in February 2024 from a 3 hourly scheduled previously.  She continues to take Azilect once a day.   02/17/2022 ALL: Purpose returns for follow up for for PD. She was last seen by Dr Frances Furbish 09/2021. She had been seen in the ER following a syncopal spell. Dr Frances Furbish weaned Comtan and increased Sinemet to 1 tablet six times daily. Azilect was continued at mg daily. Since, she is living at [facility] in [place]. She was moved 01/05/2022 following hospitalization for pneumonia. Her son is concerned that meds  may not be given as timely as they were. Carb/levo now dosed at 5a, 8a, 11p, 2p, 5p and 8p. She seems to be tolerating well. Dizziness episodes have improved. She had an ER visit in 11/2021 for orthostatic hypotension and reports an episode several weeks ago where she was dizzy upon standing and fell at the facility. Fortunately, no injuries. She has completed PT at [facility]. She tries to walk the halls daily with her Rolator. No significant change in tremor. No worsening dyskinesias. She reports eating normally. No trouble swallowing. She is sleeping well. Mood is fairly stable. She does mention that she stays up at night worrying about things. She knows that she is safer in the facility but wishes to be home.    10/23/2021 AA: Tami Vega is a very pleasant 82 year old right-handed woman with an underlying medical history of hyperlipidemia, history of pituitary microadenoma, precancerous breast lesions status post double mastectomy in the 1980s, who presents for follow-up consultation of her left-sided predominant Parkinson's disease. The patient is accompanied by her sister today. I last saw her on 04/15/2021, at which time she was advised to continue with Sinemet 5 times a day, entacapone 3 times a day and Azilect once daily.  She was on Lexapro 10 mg daily.  She was advised to follow-up with her primary care to optimize treatment for anxiety and stress.  She saw Cathy Ropp,  NP in the interim on 08/20/2021, at which time she was advised to add Parcopa half a tablet up to twice daily as needed for additional help with her regular levodopa wearing off.   Today, 10/23/2021: She reports doing a little better.  She had a recent follow-up with her primary care physician on 10/15/2021 and I reviewed the office note.  She was found to have orthostatic hypotension at the time.  She was encouraged to drink more water.  She is indicated that she is trying to drink more water but sister believes that she may not always drink  enough.  Patient estimates that she averages about 2 bottles of water per day, 16.9 ounce size.  She is getting home health PT but this week at least once she was not able to participate as her physical therapist reported that her blood pressure was too low, they are not sure as to the exact number.  Her son, Clovis Riley who lives with them may be moving out at some point soon, no exact date.  Her other son, Viviann Spare is power of attorney.  She also has a daughter who lives about 15 minutes away.  She takes her Sinemet 5 times a day starting at 7 AM, 3 hourly.  She goes to bed between 7 and 8.  She is not sleeping well through the night and often is awake in the early morning hours and lays in bed but cannot go back to sleep.   She went to the emergency room on 10/02/2021 after a fall in the bathroom.  She sustained a laceration inside the mouth.  I reviewed the emergency room records.  She was found to have low blood pressure and received IV fluids.  She had a head CT and cervical spine CT without contrast as well as maxillofacial CT without contrast on 10/02/2021 and I reviewed the results:  IMPRESSION: 1. No acute intracranial abnormality. No skull fracture. 2. Age related atrophy.   1. No acute fracture or subluxation of the cervical spine. 2. Mild multilevel degenerative disc disease and facet hypertrophy.   IMPRESSION: 1. Soft tissue laceration over the left chin with small hematoma. No acute facial bone fracture. 2. Unchanged partially calcified soft tissue density or small mass in the right superior nasal passage. This is stable dating back to 2021 head CT. As clinically indicated, consider ENT evaluation.     She was seen in the emergency room at [facility] on 10/05/2021 after a syncopal event. She was admitted to the hospital.  I reviewed the hospital records, she was discharged on 10/09/2021.  She had a head CT without contrast and cervical spine CT without contrast on 10/06/2021 and I  reviewed the results: IMPRESSION: CT of the head: Chronic atrophic changes are again identified. No acute intracranial abnormality noted.   New left parietal scalp hematoma near the vertex.   CT of the cervical spine: Multilevel degenerative change without acute abnormality. The overall appearance is stable from the prior CT.   She had orthostatic hypotension, she was treated symptomatically with fluids.  She was treated for a E. coli UTI.  She was initially on Rocephin and switched to cefdinir.  She had hypokalemia, physical deconditioning, leukocytosis.  She was discharged to a skilled nursing facility.  08/20/2021 ALL: Territa returns for follow up for PD. She was last seen by Dr Frances Furbish 03/2021. She reported more stress at home due to husbands illness. Her son had moved in with them to help provide  care. Since, she reports symptoms are fairly stable. She continues Sinemet 1 tablet 5 times daily (starting at 6:30-7 and every 3 hours thereafter), Comtan TID and Azilect 1mg  daily continued. About 2-3 times a week she feels that she needs an extra dose of Sinemet. She can tell a difference in her movement and voice if she takes dose closer to 2.5 hours apart. She feels gait is fairly stable. She had a fall a couple of months ago. No injuries. She uses her walker at all times. She is not sleeping as well. She remains under more stress due to her husband suffering with dementia. He has progressively declined over the past 3-4 months. He is loosing weight and more irritable. Her son is helping care for her husband. They also have a family member who comes to sit with her once a week. Viviann Spare, her youngest son, and her daughter also help. She is followed closely by PCP. She continues escitalopram 10mg  daily. She enjoys sewing and crafting. 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 She enjoys riding her stationary bike.   Can call Viviann Spare [phone]  10/14/2020 ALL:  Tami Vega is a 82 y.o. female here today for follow up for PD. She was last seen by Dr Frances Furbish 05/2020 and continued on Sinemet 1 tablet 5 times daily, Azilect 1 tablet daily and entacapone 1 tablet TID. She was participating in home PT and advised to increase water intake and use walker. Since, she reports doing fairly well. No recent falls. She is using her walker. Balance seems to be fairly stable. Tremor is very mild. She is tolerating medications well and without adverse effects. She feels that she is doing fairly well. She has good days and bad days. She is eating normally. Some foods do not taste as good over the past few months. She drinks a protein shake every day. She tries to drink at least 3 bottles of water. She reports having shoulder pain last night that was unusual but is much better, today. She denies trouble breathing or chest pain. She has had some congestion and a tickle cough, no fever or other respiratory symptoms.    HISTORY (copied from Dr Teofilo Pod previous note)  Tami Vega is a very pleasant 82 year old right-handed woman with an underlying medical history of hyperlipidemia, history of pituitary microadenoma, precancerous breast lesions status post double mastectomy in the 1980s, who presents for follow-up consultation of her left-sided predominant Parkinson's disease. The patient is accompanied by her sister, Bonita Quin, today. I last saw her on 02/12/2020, at which time we talked about the importance of fall prevention.  She had fallen in August 2021 and broke her proximal right humerus.  She was treated conservatively.  She was in rehab and subsequently had home health therapy.  She has ongoing stress.  She was advised to continue her Parkinson's medications.   Today, 06/10/2020: She reports having had some issues with her balance.  She has fallen about 3 times, most recently, she fell on the weekend on the way to her bed, she hit the wood floor with her forehead.  She did not lose  consciousness and did not have any headache subsequently but does have some discomfort.  She had a small swelling in the left forehead and a slight remnant of it.  She reports that she has not been drinking water as well as she should.  She was told by her primary care nurse practitioner that she was  dehydrated.  She had some blood work through her primary care recently, she is supposed to see Harlen Labs, NP again in April.  She has ongoing stress, worries about her husband, sometimes she gets frustrated.  She indicates that she continues to take her antidepressant medication.  She is currently in home health PT.  Her youngest son, lives about 5 minutes away, her older son lives about 10 minutes away.  Debbie lives about 2 to 3 minutes away and [place] checks in on her typically on Tuesdays and Thursdays.   The patient's allergies, current medications, family history, past medical history, past social history, past surgical history and problem list were reviewed and updated as appropriate.    Previously (copied from previous notes for reference):    I saw her on 10/10/2019, at which time she reported that her Sinemet was not lasting as long.  She was taking 1 pill 4 times a day.  She also had more stress what with her husband's declining health and memory loss.   I saw her on 04/12/2019, at which time she reported tolerating Comtan.  We mutually agreed to increase it to 1 pill 3 times daily.  She was advised to continue with Sinemet and Azilect and we talked about potentially utilizing inbrija potentially down the road.   I saw her on 12/07/2018, at which time she reported taking Comtan only once daily at 11 AM.  She felt that she was too sedated when she took it twice daily but she was willing to try it again.  I suggest that she added for the 3 PM dose.  We kept her Sinemet the same as well as the Azilect.    I saw her on 09/05/2018, at which time she reported feeling more slower, she had had a few  times of feeling lightheaded upon standing up too quickly.  She had a fall in the kitchen.  She was taking Sinemet 4 times a day.  She was more fatigued by the end of the day.  Constipation was under control.  She had had some stable hallucinations, nothing bothersome.  I suggested a cautious addition of Comtan for 2 of her 4 doses of Sinemet.  She called back in July 2020 indicating that she felt more unsteady since starting the Comtan and she was advised to reduce the Comtan to only once daily with 1 of her Sinemet doses.   I saw her on 03/02/2018, at which time she reported that she was not able to tolerate the Sinemet long-acting.  She had problems with insomnia, she was no longer on it.  We had started it in or around June 2019.  She was generally taking Sinemet 4 times a day.  She was having more difficulty in the mornings in terms of mobility.  She was advised to take a middle of the night dose of Sinemet for total of 5 pills/day, the middle of the night dose anywhere between 11 PM and 4 AM.  We talked about the importance of monitoring her driving.  She had limited her driving.   I saw her on 08/30/2017, at which time she reported having had one fall in the past 6 months. She fell in the kitchen. She unfortunately cracked a couple of ribs, had an x-ray through PCP. She noticed that she was more stiffer in the mornings and had difficulty moving at times, shuffling more. We talked about the importance of fall prevention and the importance of being proactive about constipation. She  was advised to hydrate well with water and continue with Sinemet 1 pill 4 times a day at 7, 11, 3 and 7. I asked her to add Sinemet CR 50-200 milligrams strength at bedtime. She was advised to continue with Azilect once daily as well.   She called a few days after that in the interim reporting that she felt more sleepy after starting the Sinemet CR. I suggested we could reduce it to 25-100 milligrams strength long-acting but she  declined.   I saw her on 02/22/17, at which time she reported doing okay, sometimes she felt lightheaded. She had had a few falls, with a tendency to fall backwards when she stood up too quickly. For her occasional constipation she was using MiraLAX as needed about twice a week. I advised her to continue her current Parkinson's disease medication regimen including Sinemet 1 pill 4 times a day and Azilect 1 pill once daily, talked about fall risk and gait safety.   I saw her on 08/18/2016, at which time she reported increase in tremor on the left side. She had some difficulty with sleep at times. She had not tried any over-the-counter medication. She had a fall about 3 months prior in the yard. Thankfully she did not sustain any major injuries. She did bruise her right thigh. I suggested she try to increase her Sinemet to 1 pill 4 times a day and keep her Azilect at 1 mg once daily. She was advised to try melatonin for sleep.   I saw her on 02/19/2016, at which time she reported overall doing quite well, she was active, sewing and making crafts. She had no recent falls but had noticed lightheadedness particularly upon standing up quickly. She had a near fall experience recently at a restaurant. She was not always hydrating well. Mood and memory were stable. Motor-wise, she felt stable as well. I suggested we continue with her medication regimen, including Azilect and Sinemet.Marland Kitchen She was furthermore advised to stay better hydrated and change positions slowly, also start using compression stockings.   I saw her on 08/12/2015, at which time she reported feeling fairly stable. She was going to the gym about 4 times a week, had lost weight, mostly because of change in taste/lack of taste. Also, she cut out sodas and has been watching what she eats, trying to drink more water. Overall, motor-wise she felt stable, had mild short-term memory issues but no mood or sinister memory issues, no recent falls. Sometimes her  left hand tremor was worse. She has 3 grown children and 3 grandchildren. She was on low-dose Lexapro. I suggested she continue with Azilect once daily and Sinemet 1 pill 3 times a day.   I saw her on 02/11/2015, at which time she reported doing better with the recent addition of Sinemet. She felt she had more energy and mobility was better as well. She needed refills on paper prescriptions because of changing her pharmacies. Her daughter reported that she had stopped taking her antidepressant for a couple of months but her family had noticed more problems with her depression and anxiety and she restarted her medication. She was trying to stay active. She is trying to drink enough water. She did have additional stressors lately because her son was involved in a car accident and was still in the hospital at the time. Overall, however, she had done fairly well. We mutually agreed to continue her on Azilect 1 mg once daily and Sinemet 1 pill 3 times a day.  I saw her on 10/16/2014, at which time she reported that her tremor was a little worse. She also felt that her walking was worse. She had a harder time picking up her feet, particularly on the left side. Thankfully she had not fallen. She was sleeping a little better since switching her antidepressant to nighttime. I asked her to continue with Azilect once daily but also to add a small dose of Sinemet starting with half a pill twice daily with gradual titration to 1 pill 3 times a day.   I saw her on 04/17/2014, at which time she reported doing a little better with regards to her tremor. She noticed a flareup of her tremor with nervousness. She was still able to use her sewing machine. She had no side effects from her medications. She did find out that she had 2 female cousins on her father's side with Parkinson's disease. I asked her to continue Azilect 1 mg once daily.   I first met her on 10/20/2013 at the request of her primary care physician, at which  time she gave a 6-12 month history of left upper extremity tremor. She also reported problems with fine motor skills on the left. Her history and physical exam are in keeping with mild parkinsonism, probably left eye predominant Parkinson's disease. I suggested further workup with a brain MRI and starting her on Azilect. She did not have a brain scan done.   She has noted L sided issues with fine motor skills, resting tremor on the LUE, she feels easily fatigued and it is difficult for her to pick up her L leg sometimes. Thankfully, she has not fallen, but her balance is off at times. She goes to [employer] 4 times a week, but has less stamina. She is a life long non-smoker, does not drink alcohol and has no history of head injury, exposure to pesticides, but did work in a Radi[employer], therapeutic for 33 years and she [place]. She has no FHx of PD or tremors.    REVIEW OF SYSTEMS: Out of a complete 14 system review of symptoms, the patient complains only of the following symptoms, fatigue, left hand tremor, imbalance, shoulder pain and all other reviewed systems are negative.   ALLERGIES: No Known Allergies   HOME MEDICATIONS: Outpatient Medications Prior to Visit  Medication Sig Dispense Refill   acetaminophen (TYLENOL) 325 MG tablet Take 650 mg by mouth every 6 (six) hours as needed.     benzocaine-menthol (CHLORASEPTIC) 6-10 MG lozenge Take 1 lozenge by mouth as needed for sore throat (every 2 hours as needed).     calcium carbonate (TUMS - DOSED IN MG ELEMENTAL CALCIUM) 500 MG chewable tablet Chew 2 tablets by mouth every 4 (four) hours as needed for indigestion or heartburn.     carbidopa-levodopa (SINEMET IR) 25-100 MG tablet Take 1 tablet by mouth 6 (six) times daily. Take at 4 AM, 7 AM, 10 AM, 1 PM, 4 PM and  7 PM (Patient taking differently: Take 0.5 tablets by mouth 6 (six) times daily. Take at 5 AM, 8 AM, 11 AM, 2 PM, 5 PM and  8 PM) 540 tablet 3   D-Mannose 500 MG CAPS Take 1 Capful by mouth  in the morning and at bedtime.     diclofenac Sodium (VOLTAREN) 1 % GEL Apply topically every 8 (eight) hours as needed.     escitalopram (LEXAPRO) 20 MG tablet Take 20 mg by mouth daily.  4  guaifenesin (ROBITUSSIN) 100 MG/5ML syrup Take 200 mg by mouth 4 (four) times daily as needed for cough.     levothyroxine (SYNTHROID) 75 MCG tablet Take 75 mcg by mouth every morning.     melatonin 5 MG TABS Take 5 mg by mouth at bedtime.     Multiple Vitamin (MULTIVITAMIN) tablet Take 1 tablet by mouth daily.     OMEPRAZOLE PO Take 20 mg by mouth daily.     polyethylene glycol (MIRALAX / GLYCOLAX) 17 g packet Take 17 g by mouth daily as needed for mild constipation.     rasagiline (AZILECT) 1 MG TABS tablet Take 1 tablet (1 mg total) by mouth daily. 90 tablet 3   simethicone (MYLICON) 80 MG chewable tablet Chew 80 mg by mouth every 6 (six) hours as needed for flatulence.     vitamin B-12 (CYANOCOBALAMIN) 500 MCG tablet Take 1,000 mcg by mouth daily.     No facility-administered medications prior to visit.     PAST MEDICAL HISTORY: Past Medical History:  Diagnosis Date   Abnormal involuntary movements(781.0)    Disorder of bone and cartilage, unspecified    Elevated blood pressure reading without diagnosis of hypertension    Major depressive disorder, single episode, unspecified    Other musculoskeletal symptoms referable to limbs(729.89)    Pain in joint, lower leg    Pure hypercholesterolemia      PAST SURGICAL HISTORY: Past Surgical History:  Procedure Laterality Date   BREAST SURGERY  1980's   benign cyst   COLONOSCOPY  1995, 1998   VAGINAL HYSTERECTOMY  1975     FAMILY HISTORY: Family History  Problem Relation Age of Onset   CAD Mother    Breast cancer Mother    CAD Father    Parkinson's disease Father    Tremor Sister    Parkinson's disease Sister    Hypertension Sister    Hypertension Sister    CAD Sister    CAD Brother    Breast cancer Maternal Aunt    Colon cancer  Maternal Aunt      SOCIAL HISTORY: Social History   Socioeconomic History   Marital status: Married    Spouse name: Not on file   Number of children: Not on file   Years of education: Not on file   Highest education level: Not on file  Occupational History   Not on file  Tobacco Use   Smoking status: Never   Smokeless tobacco: Never  Substance and Sexual Activity   Alcohol use: No    Alcohol/week: 0.0 standard drinks of alcohol   Drug use: No   Sexual activity: Not on file  Other Topics Concern   Not on file  Social History Narrative   Lives in assisted living at [facility]   Right handed    Caffeine: coffee 1 cup/day   Social Determinants of Health   Financial Resource Strain: Not on file  Food Insecurity: Low Risk  (12/15/2022)   Received from [facility]   Hunger Vital Sign    Worried About Running Out of Food in the Last Year: Never true    Ran Out of Food in the Last Year: Never true  Transportation Needs: No Transportation Needs (12/15/2022)   Received from [facility]    In the past 12 months, has lack of reliable transportation kept you from medical appointments, meetings, work or from getting things needed for daily living? : No  Physical Activity:  Not on file  Stress: Not on file  Social Connections: Not on file  Intimate Partner Violence: Not on file     PHYSICAL EXAM  There were no vitals filed for this visit.    There is no height or weight on file to calculate BMI.   Generalized: Well developed, in no acute distress  Cardiology: normal rate and rhythm, no murmur auscultated  Respiratory: clear to auscultation bilaterally    Neurological examination  Mentation: Alert oriented to time, place, history taking. Follows all commands speech and language fluent but hypophonic Cranial nerve II-XII: Pupils were equal round reactive to light. Extraocular movements show saccadic breakdown. No nystagmus.  visual field  were full on confrontational test. Facial sensation and strength were normal. Uvula tongue midline. Head turning and shoulder shrug  were normal and symmetric. Motor: The motor testing reveals 5 over 5 strength of all 4 extremities. Tone is more rigid with left upper extremity cogwheeling. Mild bradykinesia noted. No tremor. Mild dyskinesias noted of head only, today.  Sensory: Sensory testing is intact to soft touch on all 4 extremities. No evidence of extinction is noted.  Coordination: Cerebellar testing reveals good finger-nose-finger and heel-to-shin bilaterally.  Gait and station: Gait is short, shuffled, stable with Rolator. Tandem not attempted. Balance with turns mildly impaired.   Reflexes: Deep tendon reflexes are symmetric and normal bilaterally.    DIAGNOSTIC DATA (LABS, IMAGING, TESTING) - I reviewed patient records, labs, notes, testing and imaging myself where available.  Lab Results  Component Value Date   WBC 5.5 10/08/2021   HGB 12.1 10/08/2021   HCT 35.2 (L) 10/08/2021   MCV 90.0 10/08/2021   PLT 145 (L) 10/08/2021      Component Value Date/Time   NA 142 10/08/2021 0558   K 3.5 10/08/2021 0558   CL 113 (H) 10/08/2021 0558   CO2 22 10/08/2021 0558   GLUCOSE 94 10/08/2021 0558   BUN 18 10/08/2021 0558   CREATININE 0.49 10/08/2021 0558   CALCIUM 8.2 (L) 10/08/2021 0558   PROT 5.7 (L) 10/08/2021 0558   ALBUMIN 3.3 (L) 10/08/2021 0558   AST 12 (L) 10/08/2021 0558   ALT 10 10/08/2021 0558   ALKPHOS 40 10/08/2021 0558   BILITOT 1.2 10/08/2021 0558   GFRNONAA >60 10/08/2021 0558   GFRAA >60 11/07/2019 0508   No results found for: "CHOL", "HDL", "LDLCALC", "LDLDIRECT", "TRIG", "CHOLHDL" No results found for: "HGBA1C" No results found for: "VITAMINB12" Lab Results  Component Value Date   TSH 1.608 11/02/2019        No data to display               No data to display           ASSESSMENT AND PLAN  82 y.o. year old female  has a past medical  history of Abnormal involuntary movements(781.0), Disorder of bone and cartilage, unspecified, Elevated blood pressure reading without diagnosis of hypertension, Major depressive disorder, single episode, unspecified, Other musculoskeletal symptoms referable to limbs(729.89), Pain in joint, lower leg, and Pure hypercholesterolemia. here with    Parkinson's disease with dyskinesia and fluctuating manifestations (HCC)  Selenne is doing fairly well from a neurological perspective. Parkinson symptoms are stable on current treatment. Dizziness and dyskinesias have improved. We will continue generic Sinemet 1 tablet six times daily and generic Azilect 1mg  daily. I will ask facility to give as closely to 5a, 8a, 11p, 2p, 5p and 8p as possible. She was encouraged to continue healthy lifestyle  habits. Adequate hydration discussed. Will continue to monitor for worsening depression. On escitalopram. Fall precautions reviewed. She and her son, Viviann Spare, verbalize understanding. I will alternate her care with Dr Frances Furbish and have her return to see [place] in 6 months. She verbalizes understanding and agreement with this plan.    No orders of the defined types were placed in this encounter.     No orders of the defined types were placed in this encounter.   I spent 30 minutes of face-to-face and non-face-to-face time with patient.  This included previsit chart review, lab review, study review, order entry, electronic health record documentation, patient education.   Shawnie Dapper, MSN, FNP-C 02/17/2023, 4:38 PM  [facility] [address] [place], [place] [address] [phone]

## 2023-02-23 ENCOUNTER — Ambulatory Visit: Payer: Medicare Other | Admitting: Family Medicine

## 2023-02-23 DIAGNOSIS — G20B2 Parkinson's disease with dyskinesia, with fluctuations: Secondary | ICD-10-CM

## 2023-03-16 ENCOUNTER — Ambulatory Visit: Payer: Medicare Other | Admitting: Physician Assistant

## 2023-04-27 ENCOUNTER — Ambulatory Visit (INDEPENDENT_AMBULATORY_CARE_PROVIDER_SITE_OTHER): Payer: Medicare Other | Admitting: Podiatry

## 2023-04-27 ENCOUNTER — Encounter: Payer: Self-pay | Admitting: Podiatry

## 2023-04-27 DIAGNOSIS — M79675 Pain in left toe(s): Secondary | ICD-10-CM | POA: Diagnosis not present

## 2023-04-27 DIAGNOSIS — B351 Tinea unguium: Secondary | ICD-10-CM

## 2023-04-27 DIAGNOSIS — M79674 Pain in right toe(s): Secondary | ICD-10-CM

## 2023-04-27 NOTE — Progress Notes (Signed)
Subjective:  Patient ID: Tami Vega, female    DOB: June 01, 1940,  MRN: [id number]  Chief Complaint  Patient presents with   [facility]    Not diabetic. No anticoag. Her son, Tami Vega, is with her today.     83 y.o. female presents with the above complaint. History confirmed with patient. Patient presenting with pain related to dystrophic thickened elongated nails. Patient is unable to trim own nails related to nail dystrophy and/or mobility issues. Patient does not have a history of T2DM.   Objective:  Physical Exam: warm, good capillary refill nail exam onychomycosis of the toenails, onycholysis, and dystrophic nails DP pulses palpable, PT pulses palpable, and protective sensation intact Left Foot:  Pain with palpation of nails due to elongation and dystrophic growth.  Right Foot: Pain with palpation of nails due to elongation and dystrophic growth.   Assessment:   1. Pain due to onychomycosis of toenails of both feet          Plan:  Patient was evaluated and treated and all questions answered.  #Onychomycosis with pain  -Nails palliatively debrided as below. -Educated on self-care - Defer treatment with antifungals and instead proceed with routine care.   Procedure: Nail Debridement Rationale: Pain Type of Debridement: manual, sharp debridement. Instrumentation: Nail nipper, rotary burr. Number of Nails: 10  Return in about 3 months (around 07/26/2023) for Routine Foot Care.         Bronwen Betters, DPM [facility]

## 2023-06-10 NOTE — Progress Notes (Signed)
 Chief Complaint  Patient presents with   Tremors    Rm1, son present, Parkinson's disease with dyskinesia and fluctuating manifestations:pt son stated having weight loss, having to take sinimet earlier than originally scheduled feels that its waring off, lethargy   HISTORY OF PRESENT ILLNESS:  06/14/23 ALL:  Tami Vega returns for follow up for PD. She was last seen by Dr Frances Furbish 07/2022 and recovering from recent Covid infection. She felt PD symptoms were fairly stable and was advised to continue carb/levo 1 pill 6 times daily starting at 5am on a 2.5 hour schedule and Azilect daily.   Since, she reports PD is fairly stable. She is not walking much at the facility. Mostly ambulating with wheelchair. Has used walker some. She has not worked with PT, recently. Unclear why. She continues to reside at [facility]. She did have a fall off the bed a couple weeks ago while visiting her home. No injuries. Appetite is poor. She continues protein supplements twice daily. She denies difficulty swallowing. She is sleeping well. Memory is not as sharp as it used to be.   08/18/2022 SA:  She reports doing fairly well, she continues to have a lingering cough but it seems to be mostly in her throat and upper respiratory system, no wheezing, has had a little bit of shortness of breath here and there, fairly thick mucus at times.  Her son reports that she had COVID about a month ago, so mother patient's at her rehab facility had COVID and she was also tested, did not actually have any symptoms to warrant testing other than other people around her being infected.  She did end up having a cough but no fever, she was treated and is currently also on cough medicine.  As far as her Parkinson symptoms, she feels fairly stable, has not had any recent falls and tries to use her walker consistently, does not need further assistance when using her nitro walker.  She does have some lightheadedness when she stands up quickly.  She  tries to hydrate well but could do a little better according to her son.  She does have intermittent constipation for which she is on as needed MiraLAX.  She continues to stay at [facility].  She takes her levodopa 6 times a day, usually on a 2-1/2 hourly schedule which has worked better for her.  We changed this in the interim in February 2024 from a 3 hourly scheduled previously.  She continues to take Azilect once a day.   02/17/2022 ALL:  Tami Vega returns for follow up for for PD. She was last seen by Dr Frances Furbish 09/2021. She had been seen in the ER following a syncopal spell. Dr Frances Furbish weaned Comtan and increased Sinemet to 1 tablet six times daily. Azilect was continued at mg daily. Since, she is living at [facility] in [place]. She was moved 01/05/2022 following hospitalization for pneumonia. Her son is concerned that meds may not be given as timely as they were. Carb/levo now dosed at 5a, 8a, 11p, 2p, 5p and 8p. She seems to be tolerating well. Dizziness episodes have improved. She had an ER visit in 11/2021 for orthostatic hypotension and reports an episode several weeks ago where she was dizzy upon standing and fell at the facility. Fortunately, no injuries. She has completed PT at [facility]. She tries to walk the halls daily with her Rolator. No significant change in tremor. No worsening dyskinesias. She reports eating normally.  No trouble swallowing. She is sleeping well. Mood is fairly stable. She does mention that she stays up at night worrying about things. She knows that she is safer in the facility but wishes to be home.    10/23/2021 AA: Tami Vega is a very pleasant 83 year old right-handed woman with an underlying medical history of hyperlipidemia, history of pituitary microadenoma, precancerous breast lesions status post double mastectomy in the 1980s, who presents for follow-up consultation of her left-sided predominant Parkinson's disease. The patient is accompanied  by her sister today. I last saw her on 04/15/2021, at which time she was advised to continue with Sinemet 5 times a day, entacapone 3 times a day and Azilect once daily.  She was on Lexapro 10 mg daily.  She was advised to follow-up with her primary care to optimize treatment for anxiety and stress.  She saw Shawnie Dapper, NP in the interim on 08/20/2021, at which time she was advised to add Parcopa half a tablet up to twice daily as needed for additional help with her regular levodopa wearing off.   Today, 10/23/2021: She reports doing a little better.  She had a recent follow-up with her primary care physician on 10/15/2021 and I reviewed the office note.  She was found to have orthostatic hypotension at the time.  She was encouraged to drink more water.  She is indicated that she is trying to drink more water but sister believes that she may not always drink enough.  Patient estimates that she averages about 2 bottles of water per day, 16.9 ounce size.  She is getting home health PT but this week at least once she was not able to participate as her physical therapist reported that her blood pressure was too low, they are not sure as to the exact number.  Her son, Clovis Riley who lives with them may be moving out at some point soon, no exact date.  Her other son, Viviann Spare is power of attorney.  She also has a daughter who lives about 15 minutes away.  She takes her Sinemet 5 times a day starting at 7 AM, 3 hourly.  She goes to bed between 7 and 8.  She is not sleeping well through the night and often is awake in the early morning hours and lays in bed but cannot go back to sleep.   She went to the emergency room on 10/02/2021 after a fall in the bathroom.  She sustained a laceration inside the mouth.  I reviewed the emergency room records.  She was found to have low blood pressure and received IV fluids.  She had a head CT and cervical spine CT without contrast as well as maxillofacial CT without contrast on 10/02/2021 and I  reviewed the results:  IMPRESSION: 1. No acute intracranial abnormality. No skull fracture. 2. Age related atrophy.   1. No acute fracture or subluxation of the cervical spine. 2. Mild multilevel degenerative disc disease and facet hypertrophy.   IMPRESSION: 1. Soft tissue laceration over the left chin with small hematoma. No acute facial bone fracture. 2. Unchanged partially calcified soft tissue density or small mass in the right superior nasal passage. This is stable dating back to 2021 head CT. As clinically indicated, consider ENT evaluation.     She was seen in the emergency room at [facility] on 10/05/2021 after a syncopal event. She was admitted to the hospital.  I reviewed the hospital records, she was discharged on 10/09/2021.  She had a  head CT without contrast and cervical spine CT without contrast on 10/06/2021 and I reviewed the results: IMPRESSION: CT of the head: Chronic atrophic changes are again identified. No acute intracranial abnormality noted.   New left parietal scalp hematoma near the vertex.   CT of the cervical spine: Multilevel degenerative change without acute abnormality. The overall appearance is stable from the prior CT.   She had orthostatic hypotension, she was treated symptomatically with fluids.  She was treated for a E. coli UTI.  She was initially on Rocephin and switched to cefdinir.  She had hypokalemia, physical deconditioning, leukocytosis.  She was discharged to a skilled nursing facility.  08/20/2021 ALL: Dellamae returns for follow up for PD. She was last seen by Dr Frances Furbish 03/2021. She reported more stress at home due to husbands illness. Her son had moved in with them to help provide care. Since, she reports symptoms are fairly stable. She continues Sinemet 1 tablet 5 times daily (starting at 6:30-7 and every 3 hours thereafter), Comtan TID and Azilect 1mg  daily continued. About 2-3 times a week she feels that she needs an extra dose of  Sinemet. She can tell a difference in her movement and voice if she takes dose closer to 2.5 hours apart. She feels gait is fairly stable. She had a fall a couple of months ago. No injuries. She uses her walker at all times. She is not sleeping as well. She remains under more stress due to her husband suffering with dementia. He has progressively declined over the past 3-4 months. He is loosing weight and more irritable. Her son is helping care for her husband. They also have a family member who comes to sit with her once a week. Viviann Spare, her youngest son, and her daughter also help. She is followed closely by PCP. She continues escitalopram 10mg  daily. She enjoys sewing and crafting. 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 She enjoys riding her stationary bike.   Can call Viviann Spare [phone]  10/14/2020 ALL:  Jeanell Mangan is a 83 y.o. female here today for follow up for PD. She was last seen by Dr Frances Furbish 05/2020 and continued on Sinemet 1 tablet 5 times daily, Azilect 1 tablet daily and entacapone 1 tablet TID. She was participating in home PT and advised to increase water intake and use walker. Since, she reports doing fairly well. No recent falls. She is using her walker. Balance seems to be fairly stable. Tremor is very mild. She is tolerating medications well and without adverse effects. She feels that she is doing fairly well. She has good days and bad days. She is eating normally. Some foods do not taste as good over the past few months. She drinks a protein shake every day. She tries to drink at least 3 bottles of water. She reports having shoulder pain last night that was unusual but is much better, today. She denies trouble breathing or chest pain. She has had some congestion and a tickle cough, no fever or other respiratory symptoms.    HISTORY (copied from Dr Teofilo Pod previous note)  Tami Vega is a very pleasant 83 year old right-handed woman with an  underlying medical history of hyperlipidemia, history of pituitary microadenoma, precancerous breast lesions status post double mastectomy in the 1980s, who presents for follow-up consultation of her left-sided predominant Parkinson's disease. The patient is accompanied by her sister, Bonita Quin, today. I last saw her on 02/12/2020, at which time we talked about the  importance of fall prevention.  She had fallen in August 2021 and broke her proximal right humerus.  She was treated conservatively.  She was in rehab and subsequently had home health therapy.  She has ongoing stress.  She was advised to continue her Parkinson's medications.   Today, 06/10/2020: She reports having had some issues with her balance.  She has fallen about 3 times, most recently, she fell on the weekend on the way to her bed, she hit the wood floor with her forehead.  She did not lose consciousness and did not have any headache subsequently but does have some discomfort.  She had a small swelling in the left forehead and a slight remnant of it.  She reports that she has not been drinking water as well as she should.  She was told by her primary care nurse practitioner that she was dehydrated.  She had some blood work through her primary care recently, she is supposed to see Harlen Labs, NP again in April.  She has ongoing stress, worries about her husband, sometimes she gets frustrated.  She indicates that she continues to take her antidepressant medication.  She is currently in home health PT.  Her youngest son, lives about 5 minutes away, her older son lives about 10 minutes away.  Debbie lives about 2 to 3 minutes away and [place] checks in on her typically on Tuesdays and Thursdays.   The patient's allergies, current medications, family history, past medical history, past social history, past surgical history and problem list were reviewed and updated as appropriate.    Previously (copied from previous notes for reference):    I  saw her on 10/10/2019, at which time she reported that her Sinemet was not lasting as long.  She was taking 1 pill 4 times a day.  She also had more stress what with her husband's declining health and memory loss.   I saw her on 04/12/2019, at which time she reported tolerating Comtan.  We mutually agreed to increase it to 1 pill 3 times daily.  She was advised to continue with Sinemet and Azilect and we talked about potentially utilizing inbrija potentially down the road.   I saw her on 12/07/2018, at which time she reported taking Comtan only once daily at 11 AM.  She felt that she was too sedated when she took it twice daily but she was willing to try it again.  I suggest that she added for the 3 PM dose.  We kept her Sinemet the same as well as the Azilect.    I saw her on 09/05/2018, at which time she reported feeling more slower, she had had a few times of feeling lightheaded upon standing up too quickly.  She had a fall in the kitchen.  She was taking Sinemet 4 times a day.  She was more fatigued by the end of the day.  Constipation was under control.  She had had some stable hallucinations, nothing bothersome.  I suggested a cautious addition of Comtan for 2 of her 4 doses of Sinemet.  She called back in July 2020 indicating that she felt more unsteady since starting the Comtan and she was advised to reduce the Comtan to only once daily with 1 of her Sinemet doses.   I saw her on 03/02/2018, at which time she reported that she was not able to tolerate the Sinemet long-acting.  She had problems with insomnia, she was no longer on it.  We had started  it in or around June 2019.  She was generally taking Sinemet 4 times a day.  She was having more difficulty in the mornings in terms of mobility.  She was advised to take a middle of the night dose of Sinemet for total of 5 pills/day, the middle of the night dose anywhere between 11 PM and 4 AM.  We talked about the importance of monitoring her driving.  She had  limited her driving.   I saw her on 08/30/2017, at which time she reported having had one fall in the past 6 months. She fell in the kitchen. She unfortunately cracked a couple of ribs, had an x-ray through PCP. She noticed that she was more stiffer in the mornings and had difficulty moving at times, shuffling more. We talked about the importance of fall prevention and the importance of being proactive about constipation. She was advised to hydrate well with water and continue with Sinemet 1 pill 4 times a day at 7, 11, 3 and 7. I asked her to add Sinemet CR 50-200 milligrams strength at bedtime. She was advised to continue with Azilect once daily as well.   She called a few days after that in the interim reporting that she felt more sleepy after starting the Sinemet CR. I suggested we could reduce it to 25-100 milligrams strength long-acting but she declined.   I saw her on 02/22/17, at which time she reported doing okay, sometimes she felt lightheaded. She had had a few falls, with a tendency to fall backwards when she stood up too quickly. For her occasional constipation she was using MiraLAX as needed about twice a week. I advised her to continue her current Parkinson's disease medication regimen including Sinemet 1 pill 4 times a day and Azilect 1 pill once daily, talked about fall risk and gait safety.   I saw her on 08/18/2016, at which time she reported increase in tremor on the left side. She had some difficulty with sleep at times. She had not tried any over-the-counter medication. She had a fall about 3 months prior in the yard. Thankfully she did not sustain any major injuries. She did bruise her right thigh. I suggested she try to increase her Sinemet to 1 pill 4 times a day and keep her Azilect at 1 mg once daily. She was advised to try melatonin for sleep.   I saw her on 02/19/2016, at which time she reported overall doing quite well, she was active, sewing and making crafts. She had no  recent falls but had noticed lightheadedness particularly upon standing up quickly. She had a near fall experience recently at a restaurant. She was not always hydrating well. Mood and memory were stable. Motor-wise, she felt stable as well. I suggested we continue with her medication regimen, including Azilect and Sinemet.Marland Kitchen She was furthermore advised to stay better hydrated and change positions slowly, also start using compression stockings.   I saw her on 08/12/2015, at which time she reported feeling fairly stable. She was going to the gym about 4 times a week, had lost weight, mostly because of change in taste/lack of taste. Also, she cut out sodas and has been watching what she eats, trying to drink more water. Overall, motor-wise she felt stable, had mild short-term memory issues but no mood or sinister memory issues, no recent falls. Sometimes her left hand tremor was worse. She has 3 grown children and 3 grandchildren. She was on low-dose Lexapro. I suggested she continue with  Azilect once daily and Sinemet 1 pill 3 times a day.   I saw her on 02/11/2015, at which time she reported doing better with the recent addition of Sinemet. She felt she had more energy and mobility was better as well. She needed refills on paper prescriptions because of changing her pharmacies. Her daughter reported that she had stopped taking her antidepressant for a couple of months but her family had noticed more problems with her depression and anxiety and she restarted her medication. She was trying to stay active. She is trying to drink enough water. She did have additional stressors lately because her son was involved in a car accident and was still in the hospital at the time. Overall, however, she had done fairly well. We mutually agreed to continue her on Azilect 1 mg once daily and Sinemet 1 pill 3 times a day.   I saw her on 10/16/2014, at which time she reported that her tremor was a little worse. She also felt  that her walking was worse. She had a harder time picking up her feet, particularly on the left side. Thankfully she had not fallen. She was sleeping a little better since switching her antidepressant to nighttime. I asked her to continue with Azilect once daily but also to add a small dose of Sinemet starting with half a pill twice daily with gradual titration to 1 pill 3 times a day.   I saw her on 04/17/2014, at which time she reported doing a little better with regards to her tremor. She noticed a flareup of her tremor with nervousness. She was still able to use her sewing machine. She had no side effects from her medications. She did find out that she had 2 female cousins on her father's side with Parkinson's disease. I asked her to continue Azilect 1 mg once daily.   I first met her on 10/20/2013 at the request of her primary care physician, at which time she gave a 6-12 month history of left upper extremity tremor. She also reported problems with fine motor skills on the left. Her history and physical exam are in keeping with mild parkinsonism, probably left eye predominant Parkinson's disease. I suggested further workup with a brain MRI and starting her on Azilect. She did not have a brain scan done.   She has noted L sided issues with fine motor skills, resting tremor on the LUE, she feels easily fatigued and it is difficult for her to pick up her L leg sometimes. Thankfully, she has not fallen, but her balance is off at times. She goes to [employer] 4 times a week, but has less stamina. She is a life long non-smoker, does not drink alcohol and has no history of head injury, exposure to pesticides, but did work in a [employer], therapeutic for 33 years and she [place]. She has no FHx of PD or tremors.    REVIEW OF SYSTEMS: Out of a complete 14 system review of symptoms, the patient complains only of the following symptoms, fatigue, left hand tremor, imbalance, shoulder pain and all other reviewed systems  are negative.   ALLERGIES: No Known Allergies   HOME MEDICATIONS: Outpatient Medications Prior to Visit  Medication Sig Dispense Refill   acetaminophen (TYLENOL) 325 MG tablet Take 650 mg by mouth every 6 (six) hours as needed.     albuterol (PROVENTIL) (2.5 MG/3ML) 0.083% nebulizer solution Take 2.5 mg by nebulization every 6 (six) hours as needed for wheezing  or shortness of breath.     benzocaine-menthol (CHLORASEPTIC) 6-10 MG lozenge Take 1 lozenge by mouth as needed for sore throat (every 2 hours as needed).     benzonatate (TESSALON) 200 MG capsule Take 200 mg by mouth 2 (two) times daily as needed for cough.     budeson-glycopyrrolate-formoterol (BREZTRI AEROSPHERE) 160-9-4.8 MCG/ACT AERO Inhale 2 puffs into the lungs 2 (two) times daily.     carbidopa-levodopa (SINEMET IR) 25-100 MG tablet Take 1 tablet by mouth 6 (six) times daily. Take at 4 AM, 7 AM, 10 AM, 1 PM, 4 PM and  7 PM (Patient taking differently: Take 0.5 tablets by mouth 6 (six) times daily. Take at 5 AM, 8 AM, 11 AM, 2 PM, 5 PM and  8 PM) 540 tablet 3   cetirizine (ZYRTEC) 10 MG tablet Take 10 mg by mouth daily.     D-Mannose 500 MG CAPS Take 1 Capful by mouth in the morning and at bedtime.     Dextromethorphan-guaiFENesin (DELSYM CGH/CHEST CONG DM CHILD) 5-100 MG/5ML LIQD Take 10 mLs by mouth every 12 (twelve) hours as needed.     diclofenac Sodium (VOLTAREN) 1 % GEL Apply topically every 8 (eight) hours as needed.     escitalopram (LEXAPRO) 20 MG tablet Take 20 mg by mouth daily.  4   FLUoxetine (PROZAC) 40 MG capsule Take 40 mg by mouth daily.     levothyroxine (SYNTHROID) 75 MCG tablet Take 75 mcg by mouth every morning.     loperamide (IMODIUM A-D) 2 MG tablet Take 2 mg by mouth every 6 (six) hours as needed for diarrhea or loose stools.     melatonin 5 MG TABS Take 5 mg by mouth at bedtime.     Menthol, Topical Analgesic, (BIOFREEZE COOL THE PAIN) 4 % GEL Apply 1 Application topically every 12 (twelve) hours as  needed.     midodrine (PROAMATINE) 10 MG tablet Take 10 mg by mouth 3 (three) times daily.     mirtazapine (REMERON) 7.5 MG tablet Take 7.5 mg by mouth at bedtime.     Multiple Vitamin (MULTIVITAMIN) tablet Take 1 tablet by mouth daily.     OMEPRAZOLE PO Take 20 mg by mouth daily.     polyethylene glycol (MIRALAX / GLYCOLAX) 17 g packet Take 17 g by mouth daily as needed for mild constipation.     psyllium (HYDROCIL/METAMUCIL) 95 % PACK Take 1 packet by mouth daily.     rasagiline (AZILECT) 1 MG TABS tablet Take 1 tablet (1 mg total) by mouth daily. 90 tablet 3   vitamin B-12 (CYANOCOBALAMIN) 500 MCG tablet Take 1,000 mcg by mouth daily.     calcium carbonate (TUMS - DOSED IN MG ELEMENTAL CALCIUM) 500 MG chewable tablet Chew 2 tablets by mouth every 4 (four) hours as needed for indigestion or heartburn.     guaifenesin (ROBITUSSIN) 100 MG/5ML syrup Take 200 mg by mouth 4 (four) times daily as needed for cough.     simethicone (MYLICON) 80 MG chewable tablet Chew 80 mg by mouth every 6 (six) hours as needed for flatulence.     No facility-administered medications prior to visit.     PAST MEDICAL HISTORY: Past Medical History:  Diagnosis Date   Abnormal involuntary movements(781.0)    Disorder of bone and cartilage, unspecified    Elevated blood pressure reading without diagnosis of hypertension    Major depressive disorder, single episode, unspecified    Other musculoskeletal symptoms referable to limbs(729.89)  Pain in joint, lower leg    Pure hypercholesterolemia      PAST SURGICAL HISTORY: Past Surgical History:  Procedure Laterality Date   BREAST SURGERY  1980's   benign cyst   COLONOSCOPY  1995, 1998   VAGINAL HYSTERECTOMY  1975     FAMILY HISTORY: Family History  Problem Relation Age of Onset   CAD Mother    Breast cancer Mother    CAD Father    Parkinson's disease Father    Tremor Sister    Parkinson's disease Sister    Hypertension Sister    Hypertension  Sister    CAD Sister    CAD Brother    Breast cancer Maternal Aunt    Colon cancer Maternal Aunt      SOCIAL HISTORY: Social History   Socioeconomic History   Marital status: Married    Spouse name: Not on file   Number of children: Not on file   Years of education: Not on file   Highest education level: Not on file  Occupational History   Not on file  Tobacco Use   Smoking status: Never   Smokeless tobacco: Never  Substance and Sexual Activity   Alcohol use: No    Alcohol/week: 0.0 standard drinks of alcohol   Drug use: No   Sexual activity: Not on file  Other Topics Concern   Not on file  Social History Narrative   Lives in assisted living at [facility]   Right handed    Caffeine: coffee 1 cup/day   Social Drivers of Corporate investment banker Strain: Not on file  Food Insecurity: Low Risk  (12/15/2022)   Received from [facility]   Hunger Vital Sign    Worried About Running Out of Food in the Last Year: Never true    Ran Out of Food in the Last Year: Never true  Transportation Needs: No Transportation Needs (12/15/2022)   Received from [facility]    In the past 12 months, has lack of reliable transportation kept you from medical appointments, meetings, work or from getting things needed for daily living? : No  Physical Activity: Not on file  Stress: Not on file  Social Connections: Not on file  Intimate Partner Violence: Not on file     PHYSICAL EXAM  Vitals:   06/14/23 1525  BP: (!) 94/47  Pulse: 78  Resp: 15  Height: 5\' 8"  (1.727 m)      Body mass index is 26.43 kg/m.   Generalized: Well developed, in no acute distress  Cardiology: normal rate and rhythm, no murmur auscultated  Respiratory: clear to auscultation bilaterally    Neurological examination  Mentation: Alert oriented to time, place, history taking. Follows all commands speech and language fluent but hypophonic Cranial nerve II-XII: Pupils were  equal round reactive to light. Extraocular movements show saccadic breakdown. No nystagmus.  visual field were full on confrontational test. Facial sensation and strength were normal. Uvula tongue midline. Head turning and shoulder shrug  were normal and symmetric. Motor: The motor testing reveals 5 over 5 strength of all 4 extremities. Tone is more rigid with left upper extremity cogwheeling. Mild bradykinesia noted. No tremor. No dyskinesias noted.  Sensory: Sensory testing is intact to soft touch on all 4 extremities. No evidence of extinction is noted.  Coordination: Cerebellar testing reveals good finger-nose-finger and heel-to-shin bilaterally.  Gait and station: Gait was not assessed. In wheelchair with no assistive device.  Reflexes: Deep tendon reflexes are symmetric and normal bilaterally.    DIAGNOSTIC DATA (LABS, IMAGING, TESTING) - I reviewed patient records, labs, notes, testing and imaging myself where available.  Lab Results  Component Value Date   WBC 5.5 10/08/2021   HGB 12.1 10/08/2021   HCT 35.2 (L) 10/08/2021   MCV 90.0 10/08/2021   PLT 145 (L) 10/08/2021      Component Value Date/Time   NA 142 10/08/2021 0558   K 3.5 10/08/2021 0558   CL 113 (H) 10/08/2021 0558   CO2 22 10/08/2021 0558   GLUCOSE 94 10/08/2021 0558   BUN 18 10/08/2021 0558   CREATININE 0.49 10/08/2021 0558   CALCIUM 8.2 (L) 10/08/2021 0558   PROT 5.7 (L) 10/08/2021 0558   ALBUMIN 3.3 (L) 10/08/2021 0558   AST 12 (L) 10/08/2021 0558   ALT 10 10/08/2021 0558   ALKPHOS 40 10/08/2021 0558   BILITOT 1.2 10/08/2021 0558   GFRNONAA >60 10/08/2021 0558   GFRAA >60 11/07/2019 0508   No results found for: "CHOL", "HDL", "LDLCALC", "LDLDIRECT", "TRIG", "CHOLHDL" No results found for: "HGBA1C" No results found for: "VITAMINB12" Lab Results  Component Value Date   TSH 1.608 11/02/2019        No data to display               No data to display           ASSESSMENT AND PLAN  83  y.o. year old female  has a past medical history of Abnormal involuntary movements(781.0), Disorder of bone and cartilage, unspecified, Elevated blood pressure reading without diagnosis of hypertension, Major depressive disorder, single episode, unspecified, Other musculoskeletal symptoms referable to limbs(729.89), Pain in joint, lower leg, and Pure hypercholesterolemia. here with    Parkinson's disease with dyskinesia and fluctuating manifestations (HCC)  Orthostatic hypotension  History of recent fall  Episode of recurrent major depressive disorder, unspecified depression episode severity (HCC)  Decreased appetite  Shandelle is doing fairly well from a neurological perspective. Parkinson symptoms are stable on current treatment. Dizziness and dyskinesias have improved. We will continue generic Sinemet 1 tablet six times daily, starting at 5am and given on 2.5hr increment, and generic Azilect 1mg  daily. She was encouraged to continue healthy lifestyle habits. Adequate hydration discussed. Will continue to monitor for worsening depression. On fluoxetine and Remeron. Fall precautions reviewed. Continue to monitor BP. BP low, today. She and her son, Viviann Spare, verbalize understanding. I will alternate her care with Dr Frances Furbish and have her return to see [place] in 6 months. She verbalizes understanding and agreement with this plan.    No orders of the defined types were placed in this encounter.     No orders of the defined types were placed in this encounter.   I spent 30 minutes of face-to-face and non-face-to-face time with patient.  This included previsit chart review, lab review, study review, order entry, electronic health record documentation, patient education.   Shawnie Dapper, MSN, FNP-C 06/14/2023, 4:10 PM  [facility] [address] [place], [place] [address] [phone]

## 2023-06-10 NOTE — Patient Instructions (Signed)
 Below is our plan:  We will continue carbidopa levodopa 25-100mg  six times daily starting at 5am and given every 2.5 hours thereafter. Continue Azilect 1mg  daily. Please continue PT/OT as often as allowable through facility.   Please make sure you are staying well hydrated. I recommend 50-60 ounces daily. Well balanced diet and regular exercise encouraged. Consistent sleep schedule with 6-8 hours recommended.   Please continue follow up with care team as directed.   Follow up with Dr Frances Furbish in 4-6 months   You may receive a survey regarding today's visit. I encourage you to leave honest feed back as I do use this information to improve patient care. Thank you for seeing me today!   Management of Memory Problems   There are some general things you can do to help manage your memory problems.  Your memory may not in fact recover, but by using techniques and strategies you will be able to manage your memory difficulties better.   1)  Establish a routine. Try to establish and then stick to a regular routine.  By doing this, you will get used to what to expect and you will reduce the need to rely on your memory.  Also, try to do things at the same time of day, such as taking your medication or checking your calendar first thing in the morning. Think about think that you can do as a part of a regular routine and make a list.  Then enter them into a daily planner to remind you.  This will help you establish a routine.   2)  Organize your environment. Organize your environment so that it is uncluttered.  Decrease visual stimulation.  Place everyday items such as keys or cell phone in the same place every day (ie.  Basket next to front door) Use post it notes with a brief message to yourself (ie. Turn off light, lock the door) Use labels to indicate where things go (ie. Which cupboards are for food, dishes, etc.) Keep a notepad and pen by the telephone to take messages   3)  Memory Aids A diary or  journal/notebook/daily planner Making a list (shopping list, chore list, to do list that needs to be done) Using an alarm as a reminder (kitchen timer or cell phone alarm) Using cell phone to store information (Notes, Calendar, Reminders) Calendar/White board placed in a prominent position Post-it notes   In order for memory aids to be useful, you need to have good habits.  It's no good remembering to make a note in your journal if you don't remember to look in it.  Try setting aside a certain time of day to look in journal.   4)  Improving mood and managing fatigue. There may be other factors that contribute to memory difficulties.  Factors, such as anxiety, depression and tiredness can affect memory. Regular gentle exercise can help improve your mood and give you more energy. Exercise: there are short videos created by the [employer] on Health specially for older adults: https://bit.ly/2I30q97.  Mediterranean diet: which emphasizes fruits, vegetables, whole grains, legumes, fish, and other seafood; unsaturated fats such as olive oils; and low amounts of red meat, eggs, and sweets. A variation of this, called MIND (Mediterranean-DASH Intervention for Neurodegenerative Delay) incorporates the DASH (Dietary Approaches to Stop Hypertension) diet, which has been shown to lower high blood pressure, a risk factor for Alzheimer's disease. More information at: ExitMarketing.de.  Aerobic exercise that improve heart health is also good for the  mind.  [employer] on Aging have short videos for exercises that you can do at home: BlindWorkshop.com.pt Simple relaxation techniques may help relieve symptoms of anxiety Try to get back to completing activities or hobbies you enjoyed doing in the past. Learn to pace yourself through activities to decrease fatigue. Find out about some local support groups where you can share  experiences with others. Try and achieve 7-8 hours of sleep at night.   Tasks to improve attention/working memory 1. Good sleep hygiene (7-8 hrs of sleep) 2. Learning a new skill (Painting, Carpentry, Pottery, new language, Knitting). 3.Cognitive exercises (keep a daily journal, Puzzles) 4. Physical exercise and training  (30 min/day X 4 days week) 5. Being on Antidepressant if needed 6.Yoga, Meditation, Tai Chi 7. Decrease alcohol intake 8.Have a clear schedule and structure in daily routine   MIND Diet: The Mediterranean-DASH Diet Intervention for Neurodegenerative Delay, or MIND diet, targets the health of the aging brain. Research participants with the highest MIND diet scores had a significantly slower rate of cognitive decline compared with those with the lowest scores. The effects of the MIND diet on cognition showed greater effects than either the Mediterranean or the DASH diet alone.   The healthy items the MIND diet guidelines suggest include:   3+ servings a day of whole grains 1+ servings a day of vegetables (other than green leafy) 6+ servings a week of green leafy vegetables 5+ servings a week of nuts 4+ meals a week of beans 2+ servings a week of berries 2+ meals a week of poultry 1+ meals a week of fish Mainly olive oil if added fat is used   The unhealthy items, which are higher in saturated and trans fat, include: Less than 5 servings a week of pastries and sweets Less than 4 servings a week of red meat (including beef, pork, lamb, and products made from these meats) Less than one serving a week of cheese and fried foods Less than 1 tablespoon a day of butter/stick margarine

## 2023-06-14 ENCOUNTER — Ambulatory Visit (INDEPENDENT_AMBULATORY_CARE_PROVIDER_SITE_OTHER): Payer: Medicare Other | Admitting: Family Medicine

## 2023-06-14 ENCOUNTER — Encounter: Payer: Self-pay | Admitting: Family Medicine

## 2023-06-14 VITALS — BP 94/47 | HR 78 | Resp 15 | Ht 68.0 in

## 2023-06-14 DIAGNOSIS — F339 Major depressive disorder, recurrent, unspecified: Secondary | ICD-10-CM | POA: Diagnosis not present

## 2023-06-14 DIAGNOSIS — R630 Anorexia: Secondary | ICD-10-CM

## 2023-06-14 DIAGNOSIS — I951 Orthostatic hypotension: Secondary | ICD-10-CM

## 2023-06-14 DIAGNOSIS — Z9181 History of falling: Secondary | ICD-10-CM

## 2023-06-14 DIAGNOSIS — G20B2 Parkinson's disease with dyskinesia, with fluctuations: Secondary | ICD-10-CM | POA: Diagnosis not present

## 2023-07-27 ENCOUNTER — Ambulatory Visit: Payer: Medicare Other | Admitting: Podiatry

## 2023-11-30 ENCOUNTER — Ambulatory Visit (INDEPENDENT_AMBULATORY_CARE_PROVIDER_SITE_OTHER): Admitting: Podiatry

## 2023-11-30 ENCOUNTER — Encounter: Payer: Self-pay | Admitting: Podiatry

## 2023-11-30 DIAGNOSIS — B351 Tinea unguium: Secondary | ICD-10-CM | POA: Diagnosis not present

## 2023-11-30 DIAGNOSIS — M79675 Pain in left toe(s): Secondary | ICD-10-CM

## 2023-11-30 DIAGNOSIS — M79674 Pain in right toe(s): Secondary | ICD-10-CM

## 2023-11-30 NOTE — Progress Notes (Unsigned)
 nails

## 2023-12-16 ENCOUNTER — Ambulatory Visit: Admitting: Neurology

## 2023-12-16 ENCOUNTER — Encounter: Payer: Self-pay | Admitting: Neurology

## 2023-12-16 VITALS — BP 121/72 | HR 81 | Ht 68.0 in | Wt 149.4 lb

## 2023-12-16 DIAGNOSIS — G20B1 Parkinson's disease with dyskinesia, without mention of fluctuations: Secondary | ICD-10-CM

## 2023-12-16 DIAGNOSIS — G20B2 Parkinson's disease with dyskinesia, with fluctuations: Secondary | ICD-10-CM

## 2023-12-16 DIAGNOSIS — R6889 Other general symptoms and signs: Secondary | ICD-10-CM

## 2023-12-16 DIAGNOSIS — I951 Orthostatic hypotension: Secondary | ICD-10-CM

## 2023-12-16 NOTE — Progress Notes (Signed)
 Subjective:    Patient ID: Tami Vega is a 83 y.o. female.  HPI    Interim history:   Ms. Tami Vega is a very pleasant 83 year old right-handed woman with an underlying medical history of hyperlipidemia, history of pituitary microadenoma, precancerous breast lesions status post double mastectomy in the 1980s, who presents for follow-up consultation of her left-sided predominant Parkinson's disease, complicated by orthostatic hypotension, anxiety, recurrent falls, dyskinesias, and sleep disturbance.  The patient is accompanied by her son today.  She was last seen in our clinic by Greig Forbes, NP in March 2025, at which time she was advised to continue with her medication regimen.  Fall precaution was discussed.  She had had a recent fall and had evidence of orthostatic hypotension.  She was utilizing the wheelchair most of the time in her facility she had some memory issues.  Today, 12/16/2023: She reports feeling fairly stable.  She had admission to the hospital last year for double pneumonia.  She has not had any recent falls but spends most of her time in her wheelchair even in her room.  However, she does try to go to the physical therapy room at her facility to use the stationary bike which is recumbent, they do let her use it if they do not have a patient at the time.  She tries to hydrate well but could do a little better.  She drinks about 1 soda per day, typically diet soda but sometimes regular.  She feels that her memory is not as good, she is somewhat forgetful but nothing drastic and no alarming symptoms per son.  She does take her levodopa  6 times a day but sometimes there is some fluctuation about the timings due to facility administration of her medication.  She takes Remeron at night.  She has gained some weight.   The patient's allergies, current medications, family history, past medical history, past social history, past surgical history and problem list were reviewed and updated as  appropriate.    Previously (copied from previous notes for reference):     06/14/2023 (Amy Lomax, NP): <<Anique returns for follow up for PD. She was last seen by Dr Buck 07/2022 and recovering from recent Covid infection. She felt PD symptoms were fairly stable and was advised to continue carb/levo 1 pill 6 times daily starting at 5am on a 2.5 hour schedule and Azilect  daily.    Since, she reports PD is fairly stable. She is not walking much at the facility. Mostly ambulating with wheelchair. Has used walker some. She has not worked with PT, recently. Unclear why. She continues to reside at [facility]. She did have a fall off the bed a couple weeks ago while visiting her home. No injuries. Appetite is poor. She continues protein supplements twice daily. She denies difficulty swallowing. She is sleeping well. Memory is not as sharp as it used to be.   >> 08/18/2022: She reports doing fairly well, she continues to have a lingering cough but it seems to be mostly in her throat and upper respiratory system, no wheezing, has had a little bit of shortness of breath here and there, fairly thick mucus at times.  Her son reports that she had COVID about a month ago, so mother patient's at her rehab facility had COVID and she was also tested, did not actually have any symptoms to warrant testing other than other people around her being infected.  She did end up having a cough but no fever, she was  treated and is currently also on cough medicine.  As far as her Parkinson symptoms, she feels fairly stable, has not had any recent falls and tries to use her walker consistently, does not need further assistance when using her nitro walker.  She does have some lightheadedness when she stands up quickly.  She tries to hydrate well but could do a little better according to her son.  She does have intermittent constipation for which she is on as needed MiraLAX .  She continues to stay at [facility].  She  takes her levodopa  6 times a day, usually on a 2-1/2 hourly schedule which has worked better for her.  We changed this in the interim in February 2024 from a 3 hourly scheduled previously.  She continues to take Azilect  once a day.      I saw her on 04/27/2022, at which time she reported feeling anxious when she does not get her Sinemet  on time, she was currently at [facility] and rehab in [place] in skilled nursing and they have a certain timeframe during which they can administer medications.  She had not fallen recently. She had been started on BuSpar per PCP.  She was advised to continue with levodopa  1 pill 6 times a day, Azilect  once daily. I advised her not to restart the entacapone , due to increased risk for orthostatic hypotension, hallucinations, and dyskinesias.    I saw her on 10/23/2021, at which time she was advised to increase her Sinemet  to 1 pill 6 times a day and discontinue Comtan  due to blood pressure dysregulation.  She was advised to continue with Azilect .   She saw Greig Forbes, NP on 02/17/2022, at which time she was advised to continue with her medication regimen.       I saw her on 04/15/2021, at which time she was advised to continue with Sinemet  5 times a day, entacapone  3 times a day and Azilect  once daily.  She was on Lexapro  10 mg daily.  She was advised to follow-up with her primary care to optimize treatment for anxiety and stress.  She saw Greig Forbes, NP in the interim on 08/20/2021, at which time she was advised to add Parcopa  half a tablet up to twice daily as needed for additional help with her regular levodopa  wearing off.        I saw her on 06/10/2020, at which time she reported a recent fall.  She did hit her head.  She was not hydrating as well as she should and had been told by her primary care that she was dehydrated.  She reported ongoing stress and worry about her husband.  She was in home health physical therapy.  She was advised to continue with Sinemet  1  pill 5 times a day, Azilect  once daily and entacapone  1 pill 3 times a day.  She was advised to hydrate better.  Since she had sustained a fall with head injury, I advised her to get a head CT done.  She had a normal head CT through [facility] in [place] on 06/13/2020 and I reviewed the results: Impression: Mildly motion degraded exam.  No evidence of acute intracranial abnormality.  Left forehead soft tissue swelling.  Mild cerebral atrophy.  1.3 x 0.9 cm nonspecific polypoid soft tissue fullness expanding the right sphenoethmoidal recess, stable as compared to the head CT of 11/02/2019.  Consider ENT consultation as clinically warranted.   She saw Greig Forbes, NP in the interim  on 10/14/2020, at which time she reported no recent falls.  She was advised to continue with her medication regimen.     I saw her on 02/12/2020, at which time we talked about the importance of fall prevention.  She had fallen in August 2021 and broke her proximal right humerus.  She was treated conservatively.  She was in rehab and subsequently had home health therapy.  She has ongoing stress.  She was advised to continue her Parkinson's medications.     I saw her on 10/10/2019, at which time she reported that her Sinemet  was not lasting as long.  She was taking 1 pill 4 times a day.  She also had more stress what with her husband's declining health and memory loss.      I saw her on 04/12/2019, at which time she reported tolerating Comtan .  We mutually agreed to increase it to 1 pill 3 times daily.  She was advised to continue with Sinemet  and Azilect  and we talked about potentially utilizing inbrija  potentially down the road.         I saw her on 12/07/2018, at which time she reported taking Comtan  only once daily at 11 AM.  She felt that she was too sedated when she took it twice daily but she was willing to try it again.  I suggest that she added for the 3 PM dose.  We kept her Sinemet  the same as well as the Azilect .           I saw her on 09/05/2018, at which time she reported feeling more slower, she had had a few times of feeling lightheaded upon standing up too quickly.  She had a fall in the kitchen.  She was taking Sinemet  4 times a day.  She was more fatigued by the end of the day.  Constipation was under control.  She had had some stable hallucinations, nothing bothersome.  I suggested a cautious addition of Comtan  for 2 of her 4 doses of Sinemet .  She called back in July 2020 indicating that she felt more unsteady since starting the Comtan  and she was advised to reduce the Comtan  to only once daily with 1 of her Sinemet  doses.   I saw her on 03/02/2018, at which time she reported that she was not able to tolerate the Sinemet  long-acting.  She had problems with insomnia, she was no longer on it.  We had started it in or around June 2019.  She was generally taking Sinemet  4 times a day.  She was having more difficulty in the mornings in terms of mobility.  She was advised to take a middle of the night dose of Sinemet  for total of 5 pills/day, the middle of the night dose anywhere between 11 PM and 4 AM.  We talked about the importance of monitoring her driving.  She had limited her driving.     I saw her on 08/30/2017, at which time she reported having had one fall in the past 6 months. She fell in the kitchen. She unfortunately cracked a couple of ribs, had an x-ray through PCP. She noticed that she was more stiffer in the mornings and had difficulty moving at times, shuffling more. We talked about the importance of fall prevention and the importance of being proactive about constipation. She was advised to hydrate well with water and continue with Sinemet  1 pill 4 times a day at 7, 11, 3 and 7. I asked her to add  Sinemet  CR 50-200 milligrams strength at bedtime. She was advised to continue with Azilect  once daily as well.   She called a few days after that in the interim reporting that she felt more sleepy after  starting the Sinemet  CR. I suggested we could reduce it to 25-100 milligrams strength long-acting but she declined.   I saw her on 02/22/17, at which time she reported doing okay, sometimes she felt lightheaded. She had had a few falls, with a tendency to fall backwards when she stood up too quickly. For her occasional constipation she was using MiraLAX  as needed about twice a week. I advised her to continue her current Parkinson's disease medication regimen including Sinemet  1 pill 4 times a day and Azilect  1 pill once daily, talked about fall risk and gait safety.   I saw her on 08/18/2016, at which time she reported increase in tremor on the left side. She had some difficulty with sleep at times. She had not tried any over-the-counter medication. She had a fall about 3 months prior in the yard. Thankfully she did not sustain any major injuries. She did bruise her right thigh. I suggested she try to increase her Sinemet  to 1 pill 4 times a day and keep her Azilect  at 1 mg once daily. She was advised to try melatonin for sleep.   I saw her on 02/19/2016, at which time she reported overall doing quite well, she was active, sewing and making crafts. She had no recent falls but had noticed lightheadedness particularly upon standing up quickly. She had a near fall experience recently at a restaurant. She was not always hydrating well. Mood and memory were stable. Motor-wise, she felt stable as well. I suggested we continue with her medication regimen, including Azilect  and Sinemet .. She was furthermore advised to stay better hydrated and change positions slowly, also start using compression stockings.   I saw her on 08/12/2015, at which time she reported feeling fairly stable. She was going to the gym about 4 times a week, had lost weight, mostly because of change in taste/lack of taste. Also, she cut out sodas and has been watching what she eats, trying to drink more water. Overall, motor-wise she felt stable,  had mild short-term memory issues but no mood or sinister memory issues, no recent falls. Sometimes her left hand tremor was worse. She has 3 grown children and 3 grandchildren. She was on low-dose Lexapro . I suggested she continue with Azilect  once daily and Sinemet  1 pill 3 times a day.   I saw her on 02/11/2015, at which time she reported doing better with the recent addition of Sinemet . She felt she had more energy and mobility was better as well. She needed refills on paper prescriptions because of changing her pharmacies. Her daughter reported that she had stopped taking her antidepressant for a couple of months but her family had noticed more problems with her depression and anxiety and she restarted her medication. She was trying to stay active. She is trying to drink enough water. She did have additional stressors lately because her son was involved in a car accident and was still in the hospital at the time. Overall, however, she had done fairly well. We mutually agreed to continue her on Azilect  1 mg once daily and Sinemet  1 pill 3 times a day.   I saw her on 10/16/2014, at which time she reported that her tremor was a little worse. She also felt that her walking was worse. She  had a harder time picking up her feet, particularly on the left side. Thankfully she had not fallen. She was sleeping a little better since switching her antidepressant to nighttime. I asked her to continue with Azilect  once daily but also to add a small dose of Sinemet  starting with half a pill twice daily with gradual titration to 1 pill 3 times a day.    I saw her on 04/17/2014, at which time she reported doing a little better with regards to her tremor. She noticed a flareup of her tremor with nervousness. She was still able to use her sewing machine. She had no side effects from her medications. She did find out that she had 2 female cousins on her father's side with Parkinson's disease. I asked her to continue Azilect  1  mg once daily.   I first met her on 10/20/2013 at the request of her primary care physician, at which time she gave a 6-12 month history of left upper extremity tremor. She also reported problems with fine motor skills on the left. Her history and physical exam are in keeping with mild parkinsonism, probably left eye predominant Parkinson's disease. I suggested further workup with a brain MRI and starting her on Azilect . She did not have a brain scan done.    She has noted L sided issues with fine motor skills, resting tremor on the LUE, she feels easily fatigued and it is difficult for her to pick up her L leg sometimes. Thankfully, she has not fallen, but her balance is off at times. She goes to [employer] 4 times a week, but has less stamina. She is a life long non-smoker, does not drink alcohol  and has no history of head injury, exposure to pesticides, but did work in a [employer], therapeutic for 33 years and [place]. She has no FHx of PD or tremors.       Her Past Medical History Is Significant For: Past Medical History:  Diagnosis Date   Abnormal involuntary movements(781.0)    Arthritis    Disorder of bone and cartilage, unspecified    Elevated blood pressure reading without diagnosis of hypertension    Major depressive disorder, single episode, unspecified    Other musculoskeletal symptoms referable to limbs(729.89)    Pain in joint, lower leg    Pure hypercholesterolemia     Her Past Surgical History Is Significant For: Past Surgical History:  Procedure Laterality Date   BREAST SURGERY  1980's   benign cyst   COLONOSCOPY  1995, 1998   VAGINAL HYSTERECTOMY  1975    Her Family History Is Significant For: Family History  Problem Relation Age of Onset   CAD Mother    Breast cancer Mother    CAD Father    Parkinson's disease Father    Tremor Sister    Parkinson's disease Sister    Hypertension Sister    Hypertension Sister    CAD Sister    CAD Brother    Breast cancer  Maternal Aunt    Colon cancer Maternal Aunt     Her Social History Is Significant For: Social History   Socioeconomic History   Marital status: Married    Spouse name: Not on file   Number of children: Not on file   Years of education: Not on file   Highest education level: Not on file  Occupational History   Not on file  Tobacco Use   Smoking status: Never   Smokeless tobacco:  Never  Substance and Sexual Activity   Alcohol  use: No    Alcohol /week: 0.0 standard drinks of alcohol    Drug use: No   Sexual activity: Not on file  Other Topics Concern   Not on file  Social History Narrative   Lives in assisted living at [facility]   Right handed    Caffeine: 16oz of Dr. Nunzio and 4 1/2oz of tea before dinner day (no coffee)    Social Drivers of Corporate investment banker Strain: Not on file  Food Insecurity: Low Risk  (12/15/2022)   Received from [facility]   Hunger Vital Sign    Within the past 12 months, you worried that your food would run out before you got money to buy more: Never true    Within the past 12 months, the food you bought just didn't last and you didn't have money to get more. : Never true  Transportation Needs: No Transportation Needs (12/15/2022)   Received from [facility]    In the past 12 months, has lack of reliable transportation kept you from medical appointments, meetings, work or from getting things needed for daily living? : No  Physical Activity: Not on file  Stress: Not on file  Social Connections: Not on file    Her Allergies Are:  No Known Allergies:   Her Current Medications Are:  Outpatient Encounter Medications as of 12/16/2023  Medication Sig   acetaminophen  (TYLENOL ) 325 MG tablet Take 650 mg by mouth every 6 (six) hours as needed.   albuterol (PROVENTIL) (2.5 MG/3ML) 0.083% nebulizer solution Take 2.5 mg by nebulization every 6 (six) hours as needed for wheezing or shortness of breath.    benzocaine-menthol (CHLORASEPTIC) 6-10 MG lozenge Take 1 lozenge by mouth as needed for sore throat (every 2 hours as needed).   benzonatate (TESSALON) 200 MG capsule Take 200 mg by mouth 2 (two) times daily as needed for cough.   budeson-glycopyrrolate-formoterol (BREZTRI AEROSPHERE) 160-9-4.8 MCG/ACT AERO Inhale 2 puffs into the lungs 2 (two) times daily.   carbidopa -levodopa  (SINEMET  IR) 25-100 MG tablet Take 1 tablet by mouth 6 (six) times daily. Take at 4 AM, 7 AM, 10 AM, 1 PM, 4 PM and  7 PM (Patient taking differently: Take 0.5 tablets by mouth 6 (six) times daily. Take at 5 AM, 8 AM, 11 AM, 2 PM, 5 PM and  8 PM)   cetirizine (ZYRTEC) 10 MG tablet Take 10 mg by mouth daily.   D-Mannose 500 MG CAPS Take 1 Capful by mouth in the morning and at bedtime.   Dextromethorphan-guaiFENesin (DELSYM CGH/CHEST CONG DM CHILD) 5-100 MG/5ML LIQD Take 10 mLs by mouth every 12 (twelve) hours as needed.   diclofenac Sodium (VOLTAREN) 1 % GEL Apply topically every 8 (eight) hours as needed.   FLUoxetine (PROZAC) 40 MG capsule Take 40 mg by mouth daily.   levothyroxine  (SYNTHROID ) 75 MCG tablet Take 75 mcg by mouth every morning.   loperamide (IMODIUM A-D) 2 MG tablet Take 2 mg by mouth every 6 (six) hours as needed for diarrhea or loose stools.   melatonin 5 MG TABS Take 5 mg by mouth at bedtime.   Menthol, Topical Analgesic, (BIOFREEZE COOL THE PAIN) 4 % GEL Apply 1 Application topically every 12 (twelve) hours as needed.   midodrine (PROAMATINE) 10 MG tablet Take 10 mg by mouth 3 (three) times daily.   mirtazapine (REMERON) 7.5 MG tablet Take 7.5 mg by mouth at  bedtime.   Multiple Vitamin (MULTIVITAMIN) tablet Take 1 tablet by mouth daily.   OMEPRAZOLE PO Take 20 mg by mouth daily.   polyethylene glycol (MIRALAX  / GLYCOLAX ) 17 g packet Take 17 g by mouth daily as needed for mild constipation.   psyllium (HYDROCIL/METAMUCIL) 95 % PACK Take 1 packet by mouth daily.   rasagiline  (AZILECT ) 1 MG TABS tablet Take  1 tablet (1 mg total) by mouth daily.   vitamin B-12 (CYANOCOBALAMIN) 500 MCG tablet Take 1,000 mcg by mouth daily.   No facility-administered encounter medications on file as of 12/16/2023.  :  Review of Systems:  Out of a complete 14 point review of systems, all are reviewed and negative with the exception of these symptoms as listed below:  Review of Systems  Neurological:        Patient is here with her son for 6 month follow-up for parkinson's disease - no changes since last visit     Objective:  Neurological Exam  Physical Exam Physical Examination:   Vitals:   12/16/23 1259  BP: 121/72  Pulse: 81    General Examination: The patient is a very pleasant 83 y.o. female in no acute distress. She appears frail, good spirits, mild dyskinesias noted, situated in her wheelchair.  She is well-groomed.  HEENT: Normocephalic, atraumatic, pupils are equal, round and reactive to light. Corrective eyeglasses in place, bilateral cataracts noted. Extraocular tracking is impaired, decrease in eye blink rate and mild to moderate facial masking noted.  Oropharynx exam reveals mild to moderate mouth dryness. No significant airway crowding is noted. Mallampati is class II. Tongue protrudes centrally and palate elevates symmetrically. There is no obvious sialorrhea.  Speech is moderately hypophonic, slightly dysarthric at times and mildly hoarse.  No obvious change.   Chest: is clear to auscultation without wheezing, rhonchi or crackles noted.   Heart: sounds are regular and normal without murmurs, rubs or gallops noted.    Abdomen: is soft, non-tender and non-distended.   Extremities: There is trace puffiness in the distal lower extremities bilaterally.      Skin: is warm and dry with no trophic changes noted. Age-related changes are noted on the skin.   Musculoskeletal: exam reveals no obvious joint deformities, but reports a left hip pain.   Neurologically:  Mental status: The patient is  awake and alert, paying good attention, at times slower to respond, speech is hypophonic, memory mildly impaired, her son gives details of her history, mild bradyphrenia.  Slight dysarthria noted at times. Affect normal and stable.  Cranial nerves are as described above under HEENT exam.   Motor exam: Thin bulk, global strength of 4 out of 5, mild generalized dyskinesias noted.  Tone is rigid with cogwheeling in both upper extremities.  There is overall moderate bradykinesia. There is no drift or rebound.  There is a mild to moderate resting tremor in the left upper extremity. Romberg is not tested d/t safety concern. Fine motor skills exam: moderately impaired on the right and moderate to significantly impaired on the left, overall stable.    Cerebellar testing shows no dysmetria or intention tremor on finger to nose testing. there is no truncal or gait ataxia.    Sensory exam is intact to light touch in the upper and lower extremities.    Gait, station and balance: She is in a wheelchair and I did not have her stand or walk for me today.     Assessment and Plan:    In summary,  Chayla Shands is a very pleasant 83 year old right-handed woman with an underlying medical history of hyperlipidemia, history of pituitary microadenoma, precancerous breast lesions status post double mastectomy in the 1980s, who presents for follow-up consultation of her left-sided predominant Parkinson's disease, complicated by orthostatic hypotension, constipation, intermittent hallucinations, forgetfulness, anxiety, recurrent falls, dyskinesias, and sleep disturbance.  Symptoms dates back to about 9 years ago.   As expected, she had progression over time.  She has become more frail over time.  She has had recurrent falls.  Unfortunately, she broke her arm in August 2021.  She has had intermittent visual hallucinations and more issues with constipation and orthostatic hypotension, also dyskinesias over time.   She has been  residing in a skilled nursing facility for the past approximately 2 years.   She started C/L in July 2016. She is also on Azilect  1 mg once daily, started this in July 2015. Sinemet  was increased in 5/18 to 1 pill 4 times a day. She has had a fall with injury to the ribs. For depression and anxiety she has been maintained on Lexapro , currently at 20 mg daily.  We increased her Sinemet  to 1 pill 5 times daily in June 2021.  She was taken off Comtan  in July 2023.    She is advised to continue with Sinemet  1 pill 6 times a day and Azilect  1 pill once daily.  We talked about the challenges of advancing Parkinson's disease again today and the importance of staying active physically within limitations and fall prevention.  She is encouraged to stay better hydrated with water and limit her soda intake. We will continue to monitor her memory.  She has had some forgetfulness.    She is advised to continue with MiraLAX  as needed for constipation.       I recommend a follow-up to see one of our nurse practitioners in about 6 months, sooner if needed.     I answered all their questions today and the patient and her son were in agreement.  I spent 30 minutes in total face-to-face time and in reviewing records during pre-charting, more than 50% of which was spent in counseling and coordination of care, reviewing test results, reviewing medications and treatment regimen and/or in discussing or reviewing the diagnosis of PD, the prognosis and treatment options. Pertinent laboratory and imaging test results that were available during this visit with the patient were reviewed by me and considered in my medical decision making (see chart for details).

## 2024-02-29 ENCOUNTER — Encounter: Payer: Self-pay | Admitting: Podiatry

## 2024-02-29 ENCOUNTER — Ambulatory Visit: Admitting: Podiatry

## 2024-02-29 DIAGNOSIS — M79675 Pain in left toe(s): Secondary | ICD-10-CM

## 2024-02-29 DIAGNOSIS — M79674 Pain in right toe(s): Secondary | ICD-10-CM | POA: Diagnosis not present

## 2024-02-29 DIAGNOSIS — B351 Tinea unguium: Secondary | ICD-10-CM | POA: Diagnosis not present

## 2024-02-29 NOTE — Progress Notes (Unsigned)
 Subjective:  Patient ID: Tami Vega, female    DOB: [birth year],  MRN: [id number]  Chief Complaint  Patient presents with   RFC    RFC no callous Not diabetic and no anti coag.     83 y.o. female presents with the above complaint. History confirmed with patient. Patient presenting with pain related to dystrophic thickened elongated nails. Patient is unable to trim own nails related to nail dystrophy and/or mobility issues. Patient does not have a history of T2DM. She comes from a facility. She is using a wheelchair today.  She reports using a walker when in her facility and ambulating.  She does report recent fall about a week ago.  Objective:  Physical Exam: warm, good capillary refill nail exam onychomycosis of the toenails, onycholysis, and dystrophic nails DP pulses palpable, PT pulses palpable, and protective sensation intact Left Foot:  Pain with palpation of nails due to elongation and dystrophic growth.  Right Foot: Pain with palpation of nails due to elongation and dystrophic growth.   Using wheelchair today.  Ambulates short distances in facility with walker.  Assessment:   1. Pain due to onychomycosis of toenails of both feet       Plan:  Patient was evaluated and treated and all questions answered.  #Onychomycosis with pain  -Nails palliatively debrided as below. -Educated on self-care  Procedure: Nail Debridement Rationale: Pain Type of Debridement: manual, sharp debridement. Instrumentation: Nail nipper, rotary burr. Number of Nails: 10    Return in about 3 months (around 05/29/2024) for Routine Foot Care.         Ethan Saddler, DPM [facility] / [facility]

## 2024-05-29 ENCOUNTER — Ambulatory Visit: Admitting: Podiatry

## 2024-05-31 ENCOUNTER — Ambulatory Visit: Admitting: Neurology

## 2024-06-15 ENCOUNTER — Ambulatory Visit: Admitting: Neurology
